# Patient Record
Sex: Female | Born: 1970 | Race: White | Hispanic: No | Smoking: Never smoker
Health system: Southern US, Community
[De-identification: ages and names within clinical notes are randomized; demographics above are authoritative.]

## PROBLEM LIST (undated history)

## (undated) ENCOUNTER — Emergency Department (HOSPITAL_COMMUNITY): Payer: Medicare Other

## (undated) DIAGNOSIS — C50919 Malignant neoplasm of unspecified site of unspecified female breast: Secondary | ICD-10-CM

## (undated) DIAGNOSIS — H9190 Unspecified hearing loss, unspecified ear: Secondary | ICD-10-CM

## (undated) DIAGNOSIS — M2550 Pain in unspecified joint: Secondary | ICD-10-CM

## (undated) DIAGNOSIS — G629 Polyneuropathy, unspecified: Secondary | ICD-10-CM

## (undated) DIAGNOSIS — M7989 Other specified soft tissue disorders: Secondary | ICD-10-CM

## (undated) DIAGNOSIS — M1990 Unspecified osteoarthritis, unspecified site: Secondary | ICD-10-CM

## (undated) DIAGNOSIS — IMO0002 Reserved for concepts with insufficient information to code with codable children: Secondary | ICD-10-CM

## (undated) DIAGNOSIS — G43909 Migraine, unspecified, not intractable, without status migrainosus: Secondary | ICD-10-CM

## (undated) DIAGNOSIS — G709 Myoneural disorder, unspecified: Secondary | ICD-10-CM

## (undated) DIAGNOSIS — Z923 Personal history of irradiation: Secondary | ICD-10-CM

## (undated) DIAGNOSIS — J45909 Unspecified asthma, uncomplicated: Secondary | ICD-10-CM

## (undated) DIAGNOSIS — M329 Systemic lupus erythematosus, unspecified: Secondary | ICD-10-CM

## (undated) DIAGNOSIS — C801 Malignant (primary) neoplasm, unspecified: Secondary | ICD-10-CM

## (undated) DIAGNOSIS — R51 Headache: Secondary | ICD-10-CM

## (undated) DIAGNOSIS — T7840XA Allergy, unspecified, initial encounter: Secondary | ICD-10-CM

## (undated) HISTORY — DX: Myoneural disorder, unspecified: G70.9

## (undated) HISTORY — DX: Unspecified hearing loss, unspecified ear: H91.90

## (undated) HISTORY — DX: Other specified soft tissue disorders: M79.89

## (undated) HISTORY — DX: Malignant (primary) neoplasm, unspecified: C80.1

## (undated) HISTORY — PX: CHOLECYSTECTOMY: SHX55

## (undated) HISTORY — DX: Personal history of irradiation: Z92.3

## (undated) HISTORY — DX: Malignant neoplasm of unspecified site of unspecified female breast: C50.919

## (undated) HISTORY — DX: Systemic lupus erythematosus, unspecified: M32.9

## (undated) HISTORY — DX: Unspecified asthma, uncomplicated: J45.909

## (undated) HISTORY — PX: ABDOMINAL EXPLORATION SURGERY: SHX538

## (undated) HISTORY — DX: Headache: R51

## (undated) HISTORY — DX: Migraine, unspecified, not intractable, without status migrainosus: G43.909

## (undated) HISTORY — DX: Reserved for concepts with insufficient information to code with codable children: IMO0002

## (undated) HISTORY — DX: Pain in unspecified joint: M25.50

## (undated) HISTORY — PX: TONSILLECTOMY: SUR1361

---

## 1993-07-28 HISTORY — PX: APPENDECTOMY: SHX54

## 1995-07-29 HISTORY — PX: SPLENECTOMY, TOTAL: SHX788

## 1995-07-29 HISTORY — PX: TUBAL LIGATION: SHX77

## 1995-07-29 HISTORY — PX: HAND SURGERY: SHX662

## 2011-10-13 ENCOUNTER — Emergency Department: Payer: Self-pay | Admitting: Emergency Medicine

## 2011-10-14 ENCOUNTER — Emergency Department: Payer: Self-pay | Admitting: *Deleted

## 2012-02-11 ENCOUNTER — Other Ambulatory Visit: Payer: Self-pay | Admitting: Obstetrics and Gynecology

## 2012-02-11 DIAGNOSIS — N6310 Unspecified lump in the right breast, unspecified quadrant: Secondary | ICD-10-CM

## 2012-02-11 DIAGNOSIS — N644 Mastodynia: Secondary | ICD-10-CM

## 2012-02-16 ENCOUNTER — Ambulatory Visit
Admission: RE | Admit: 2012-02-16 | Discharge: 2012-02-16 | Disposition: A | Discharge status: Home / Self Care | Payer: No Typology Code available for payment source | Source: Ambulatory Visit | Attending: Obstetrics and Gynecology | Admitting: Obstetrics and Gynecology

## 2012-02-16 DIAGNOSIS — N644 Mastodynia: Secondary | ICD-10-CM

## 2012-02-16 DIAGNOSIS — N6310 Unspecified lump in the right breast, unspecified quadrant: Secondary | ICD-10-CM

## 2012-02-17 ENCOUNTER — Telehealth: Payer: Self-pay | Admitting: *Deleted

## 2012-02-17 NOTE — Telephone Encounter (Signed)
Patient called me late yesterday afternoon and left voicemail. Attempted to call patient back. No one answered phone. Left message for patient to call me back.

## 2012-02-19 ENCOUNTER — Telehealth: Payer: Self-pay | Admitting: *Deleted

## 2012-02-19 ENCOUNTER — Other Ambulatory Visit: Payer: Self-pay | Admitting: Obstetrics and Gynecology

## 2012-02-19 DIAGNOSIS — N6310 Unspecified lump in the right breast, unspecified quadrant: Secondary | ICD-10-CM

## 2012-02-19 NOTE — Telephone Encounter (Signed)
Referred patient to [facility]. Patient has appointment on February 24, 2012 at 1445. Let patient know will let the [facility] know when her appointment is to schedule her biopsy. Patient verbalized understanding.  Notified Fletcher Anon at the [facility] of patients appointment at [facility].

## 2012-02-25 ENCOUNTER — Other Ambulatory Visit: Payer: Self-pay | Admitting: Obstetrics and Gynecology

## 2012-02-25 ENCOUNTER — Ambulatory Visit
Admission: RE | Admit: 2012-02-25 | Discharge: 2012-02-25 | Disposition: A | Discharge status: Home / Self Care | Payer: No Typology Code available for payment source | Source: Ambulatory Visit | Attending: Obstetrics and Gynecology | Admitting: Obstetrics and Gynecology

## 2012-02-25 DIAGNOSIS — N6310 Unspecified lump in the right breast, unspecified quadrant: Secondary | ICD-10-CM

## 2012-02-26 ENCOUNTER — Ambulatory Visit
Admission: RE | Admit: 2012-02-26 | Discharge: 2012-02-26 | Disposition: A | Discharge status: Home / Self Care | Payer: No Typology Code available for payment source | Source: Ambulatory Visit | Attending: Obstetrics and Gynecology | Admitting: Obstetrics and Gynecology

## 2012-02-26 DIAGNOSIS — N6310 Unspecified lump in the right breast, unspecified quadrant: Secondary | ICD-10-CM

## 2012-02-27 ENCOUNTER — Telehealth: Payer: Self-pay | Admitting: *Deleted

## 2012-02-27 ENCOUNTER — Other Ambulatory Visit: Payer: Self-pay | Admitting: *Deleted

## 2012-02-27 ENCOUNTER — Other Ambulatory Visit: Payer: Self-pay | Admitting: Oncology

## 2012-02-27 DIAGNOSIS — C50519 Malignant neoplasm of lower-outer quadrant of unspecified female breast: Secondary | ICD-10-CM | POA: Insufficient documentation

## 2012-02-27 DIAGNOSIS — C50911 Malignant neoplasm of unspecified site of right female breast: Secondary | ICD-10-CM

## 2012-02-27 NOTE — Telephone Encounter (Signed)
Confirmed [facility] for 03/03/12 at 1200.  Instructions and contact information given.

## 2012-03-02 ENCOUNTER — Ambulatory Visit
Admission: RE | Admit: 2012-03-02 | Discharge: 2012-03-02 | Disposition: A | Discharge status: Home / Self Care | Payer: Self-pay | Source: Ambulatory Visit | Attending: Oncology | Admitting: Oncology

## 2012-03-02 DIAGNOSIS — C50911 Malignant neoplasm of unspecified site of right female breast: Secondary | ICD-10-CM

## 2012-03-02 MED ORDER — GADOBENATE DIMEGLUMINE 529 MG/ML IV SOLN
19.0000 mL | Freq: Once | INTRAVENOUS | Status: AC | PRN
  Administered 2012-03-02: 19 mL via INTRAVENOUS

## 2012-03-03 ENCOUNTER — Ambulatory Visit: Payer: Self-pay | Attending: General Surgery | Admitting: Physical Therapy

## 2012-03-03 ENCOUNTER — Ambulatory Visit (HOSPITAL_BASED_OUTPATIENT_CLINIC_OR_DEPARTMENT_OTHER): Payer: Medicare Other | Admitting: General Surgery

## 2012-03-03 ENCOUNTER — Ambulatory Visit: Payer: Self-pay

## 2012-03-03 ENCOUNTER — Other Ambulatory Visit (INDEPENDENT_AMBULATORY_CARE_PROVIDER_SITE_OTHER): Payer: Self-pay | Admitting: General Surgery

## 2012-03-03 ENCOUNTER — Other Ambulatory Visit: Payer: Self-pay | Admitting: Lab

## 2012-03-03 ENCOUNTER — Ambulatory Visit
Admission: RE | Admit: 2012-03-03 | Discharge: 2012-03-03 | Disposition: A | Discharge status: Home / Self Care | Payer: Medicare Other | Source: Ambulatory Visit | Attending: Radiation Oncology | Admitting: Radiation Oncology

## 2012-03-03 ENCOUNTER — Other Ambulatory Visit (HOSPITAL_BASED_OUTPATIENT_CLINIC_OR_DEPARTMENT_OTHER): Payer: No Typology Code available for payment source | Admitting: Lab

## 2012-03-03 ENCOUNTER — Encounter (INDEPENDENT_AMBULATORY_CARE_PROVIDER_SITE_OTHER): Payer: Self-pay | Admitting: General Surgery

## 2012-03-03 ENCOUNTER — Ambulatory Visit (HOSPITAL_BASED_OUTPATIENT_CLINIC_OR_DEPARTMENT_OTHER): Payer: No Typology Code available for payment source | Admitting: Oncology

## 2012-03-03 ENCOUNTER — Encounter: Payer: Self-pay | Admitting: Specialist

## 2012-03-03 ENCOUNTER — Encounter: Payer: Self-pay | Admitting: *Deleted

## 2012-03-03 ENCOUNTER — Other Ambulatory Visit: Payer: Self-pay | Admitting: Oncology

## 2012-03-03 ENCOUNTER — Ambulatory Visit (HOSPITAL_BASED_OUTPATIENT_CLINIC_OR_DEPARTMENT_OTHER): Payer: No Typology Code available for payment source | Admitting: Genetic Counselor

## 2012-03-03 VITALS — BP 119/80 | HR 68 | Temp 98.6°F | Resp 20 | Ht 62.0 in | Wt 203.0 lb

## 2012-03-03 DIAGNOSIS — C50919 Malignant neoplasm of unspecified site of unspecified female breast: Secondary | ICD-10-CM

## 2012-03-03 DIAGNOSIS — N6310 Unspecified lump in the right breast, unspecified quadrant: Secondary | ICD-10-CM

## 2012-03-03 DIAGNOSIS — R293 Abnormal posture: Secondary | ICD-10-CM | POA: Insufficient documentation

## 2012-03-03 DIAGNOSIS — IMO0001 Reserved for inherently not codable concepts without codable children: Secondary | ICD-10-CM | POA: Insufficient documentation

## 2012-03-03 DIAGNOSIS — IMO0002 Reserved for concepts with insufficient information to code with codable children: Secondary | ICD-10-CM

## 2012-03-03 DIAGNOSIS — C773 Secondary and unspecified malignant neoplasm of axilla and upper limb lymph nodes: Secondary | ICD-10-CM

## 2012-03-03 DIAGNOSIS — C50519 Malignant neoplasm of lower-outer quadrant of unspecified female breast: Secondary | ICD-10-CM

## 2012-03-03 DIAGNOSIS — M25619 Stiffness of unspecified shoulder, not elsewhere classified: Secondary | ICD-10-CM | POA: Insufficient documentation

## 2012-03-03 DIAGNOSIS — Z170 Estrogen receptor positive status [ER+]: Secondary | ICD-10-CM

## 2012-03-03 DIAGNOSIS — C50419 Malignant neoplasm of upper-outer quadrant of unspecified female breast: Secondary | ICD-10-CM

## 2012-03-03 LAB — CBC WITH DIFFERENTIAL/PLATELET
Basophils Absolute: 0.1 10*3/uL (ref 0.0–0.1)
Eosinophils Absolute: 0.1 10*3/uL (ref 0.0–0.5)
HGB: 14.3 g/dL (ref 11.6–15.9)
MCV: 98.7 fL (ref 79.5–101.0)
MONO#: 0.6 10*3/uL (ref 0.1–0.9)
NEUT#: 2.7 10*3/uL (ref 1.5–6.5)
RBC: 4.3 10*6/uL (ref 3.70–5.45)
RDW: 13 % (ref 11.2–14.5)
WBC: 7.2 10*3/uL (ref 3.9–10.3)
lymph#: 3.6 10*3/uL — ABNORMAL HIGH (ref 0.9–3.3)

## 2012-03-03 LAB — COMPREHENSIVE METABOLIC PANEL
Albumin: 3.5 g/dL (ref 3.5–5.2)
BUN: 15 mg/dL (ref 6–23)
CO2: 29 mEq/L (ref 19–32)
Calcium: 9.3 mg/dL (ref 8.4–10.5)
Chloride: 102 mEq/L (ref 96–112)
Glucose, Bld: 98 mg/dL (ref 70–99)
Potassium: 3.8 mEq/L (ref 3.5–5.3)
Sodium: 139 mEq/L (ref 135–145)
Total Protein: 7.5 g/dL (ref 6.0–8.3)

## 2012-03-03 NOTE — Progress Notes (Signed)
[facility] Radiation Oncology NEW PATIENT EVALUATION  Name: Holly Parrish MRN: [id number]  Date:   03/03/2012           DOB: 10/27/1970  Status: outpatient   CC: Dr. Emelia Loron   REFERRING PHYSICIAN: Dr. 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 female who is seen today for the courtesy of Dr. Dwain Sarna at the [facility]. for evaluation of her T2 N1 invasive mammary carcinoma the right breast. She first noted a right breast mass or proximally 5 months ago. She was without insurance and delayed her evaluation. [facility] at [facility] on 02/16/2012 at which time she a palpable mass within the right breast at 8:00 measuring approximately 4 cm in size. In addition there is a palpable 3 cm low right axillary lymph node. Ultrasound showed a 2.7 x 2.7 x 2.9 cm mass at 8:00 within the right breast in addition to 2 enlarged lower right axillary lymph nodes. The largest lymph node measured 4 cm in greatest dimensions. The smaller and more superiorly located lymph node measured 3.1 cm. Ultrasound-guided biopsies of the right breast mass and lymph nodes were diagnostic for invasive mammary carcinoma with angiolymphatic spread seen within the right axillary lymph node. Breast MR on 03/02/2012 showed a dominant right breast mass at 8:00 with an adjacent satellite nodule versus an intramammary lymph node in continuity with a dominant mass. There appear to be bulky right axillary lymphadenopathy along with right retropectoral lymphadenopathy. Left breast was unremarkable.  PREVIOUS RADIATION THERAPY: No   PAST MEDICAL HISTORY:  has a past medical history of Lupus; Migraines; Joint pain; Hearing loss; Bleeding nose; Bilateral swelling of feet; Headache; Breast cancer; and Cancer.  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   PAST SURGICAL HISTORY:    Past Surgical History  Procedure Date  . Appendectomy   . Tonsillectomy   . Cholecystectomy   . Tubal ligation      FAMILY HISTORY: family history includes Breast cancer in her cousin and maternal grandmother; Pancreatic cancer in her paternal uncle; Throat cancer in her paternal uncle; and Uterine cancer in her mother. Her mother committed suicide in her 56s. Her father died from congestive heart failure 52.   SOCIAL HISTORY:  reports that she has never smoked. She does not have any smokeless tobacco history on file. She reports that she drinks alcohol. She reports that she does not use illicit drugs. Divorced, 4 children, she works as a Software engineer.   ALLERGIES: Nsaids; Penicillins; Tetanus toxoids; and Hydrogen peroxide   MEDICATIONS:  Current Outpatient Prescriptions  Medication Sig Dispense Refill  . aspirin-acetaminophen-caffeine (EXCEDRIN MIGRAINE) 250-250-65 MG per tablet Take 1 tablet by mouth every 6 (six) hours as needed.      . Emu Oil OIL by Does not apply route.         REVIEW OF SYSTEMS:  Pertinent items are noted in HPI.    PHYSICAL EXAM: Alert and oriented 41 year old white female appearing her stated age. Wt Readings from Last 3 Encounters:  03/03/12 203 lb (92.08 kg)   Temp Readings from Last 3 Encounters:  03/03/12 98.6 F (37 C)    BP Readings from Last 3 Encounters:  03/03/12 119/80   Pulse Readings from Last 3 Encounters:  03/03/12 68   Head and neck examination: Grossly unremarkable.  Nodes:  There are 2 large lymph nodes palpable within the right axilla which are tender to palpation. There is no supraclavicular adenopathy. Breasts: There is a for similar mass palpable at approximately 8:00. Left breast without masses or lesions. Abdomen without hepatomegaly. Extremities: Without edema. Neurologic examination: Grossly nonfocal.    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 She'll be scheduled for a PET CT to complete her staging workup. Dr. Donnie Coffin is seeing her for consideration of neoadjuvant chemotherapy, she may be a candidate for breast preservation. We discussed radiation therapy in general terms including the acute and late toxicity of local regional radiation therapy. Even if she has a complete response to neoadjuvant chemotherapy, and undergoes mastectomy, she still should have post mastectomy radiation therapy based on her extent of local regional disease.   PLAN: As discussed above. I can see her back after her definitive surgery.   I spent 40 minutes minutes face to face with the patient and more than 50% of that time was spent in counseling and/or coordination of care.

## 2012-03-03 NOTE — Progress Notes (Signed)
I met the patient today in the multidisciplinary clinic and reviewed with her the distress screening.  She indicated her distress level is "7".  Holly Parrish is new to the [place] area, having left her ex-husband and 4 children in [place].  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  She indicated today that she has experienced loss on many levels and feels alone.  I strongly encouraged her to attend Breast Cancer Support Group, and at her request, I have made a referral to [facility].  I also told her about counseling and other services available in the Patient and [facility].

## 2012-03-03 NOTE — Progress Notes (Signed)
Holly Parrish [id number] 07/25/1971 40 y.o. 03/03/2012 6:48 PM  CC none  No primary provider on file. No primary provider on file.  REASON FOR CONSULTATION:  Breast cancer Patient was seen in the Multidisciplinary Breast Clinic for discussion of her treatment options. She was seen by Dr. Pierce Crane, [facility]  STAGE:   Cancer of lower-outer quadrant of female breast   Primary site: Breast (Right)   Staging method: AJCC 7th Edition   Clinical: Stage IIB (T2, N1, cM0)   Summary: Stage IIB (T2, N1, cM0)  REFERRING PHYSICIAN: None  HISTORY OF PRESENT ILLNESS:  Holly Parrish is a 41 y.o. female.  Living in [place] referred for evaluation of locally advanced breast cancer. She noted a mass in her right breast about 5 months ago. She did not have any healthcare an insurance and so delayed getting this looked after. She ultimately underwent a mammogram on 02/16/2012. There showed a firm palpable mass right breast 8:00 position measuring 4 cm there was an additional 3 cm lower right axillary lymph node. Ultrasound confirmed the presence of this mass in the breast measuring 2.7 x 2.7 x 2.9 cm. There were 2 large right lower right axillary lymph nodes largest one measuring 4 cm a smaller one measuring 2 x 3.1 cm. She underwent biopsies of both these areas. 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 This is also the ER and PR positive.  Past Medical History: Past Medical History  Diagnosis Date  . Lupus    diagnosed while living in [place]. She had his diagnosis made in 1998 at that time was placed on prednisone and she ultimately came off her medications and start using a more holistic medicines. She does not have any exacerbations  of lupus. She denies any manifestations of this.    . Migraines   . Joint pain   . Hearing loss   . Bleeding nose   . Bilateral swelling of feet   . Headache   .  Breast cancer   . Cancer     Cervical, treated with cryoablation, 2009, no recent Pap smear since  2011     Past Surgical History: Past Surgical History  Procedure Date  . Appendectomy   . Tonsillectomy   . Cholecystectomy   . Tubal ligation    she had a hysterectomy for apparent respectable rupture in 1997 when she was 3 months pregnant she had an unremarkable recovery. She is also hospitalized with what sounds like pancreatitis in 2009 and ultimately had her gallbladder removed.  Family History: Family History  Problem Relation Age of Onset  . Uterine cancer Mother   . Pancreatic cancer Paternal Uncle   . Throat cancer Paternal Uncle   . Breast cancer Maternal Grandmother   26s    . Breast cancer                                                                     3 Cousins ages 3, 11 and 59-52     Social History History  Substance Use Topics  . Smoking status: Never Smoker   . Smokeless tobacco: Not on file  . 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 She is sensitive to [facility] the past year where she has a nephew in [place] and is working as a Fish farm manager here. All her family is in [place]. She has 4 children all of whom in [place] as well. She's been married twice first time performance a second time for 18 years and recently has been divorced.  Allergies: Allergies  Allergen Reactions  . Nsaids Anaphylaxis  . Penicillins Anaphylaxis  . Tetanus Toxoids Anaphylaxis  . Hydrogen Peroxide Other (See Comments)    "It burns until I bleed."    Current Medications: Current Outpatient Prescriptions  Medication Sig Dispense Refill  . aspirin-acetaminophen-caffeine (EXCEDRIN MIGRAINE) 250-250-65 MG per tablet Take 1 tablet by mouth every 6 (six) hours as needed.      . Emu Oil OIL by Does not apply route.        OB/GYN History: G5 P5 one daughter died at age 15, menarche  at age 11 age of first live birth is 89 no recent history of birth control pill use has normal menses.  Fertility Discussion: No Prior History of Cancer: In situ carcinoma the cervix  Health Maintenance:  Colonoscopy yes Bone Density no Last PAP smear last 2011  ECOG PERFORMANCE STATUS: 0 - Asymptomatic  Genetic Counseling/testing: To be scheduled  REVIEW OF SYSTEMS:  She has migratory aches and pains. She has occasional migraines. She denies blurry vision or double vision. Appetite good weight is stable she denies night sweats or fevers. Denies any chest pain nausea vomiting numbness tingling in hands and feet.  PHYSICAL EXAMINATION: Blood pressure 119/80, pulse 68, temperature 98.6 F (37 C), resp. rate 20, height 5\' 2"  (1.575 m), weight 203 lb (92.08 kg).  HEENT:  Sclerae anicteric, conjunctivae pink.  Oropharynx clear.  No mucositis or candidiasis.  Nodes:  No cervical, supraclavicular, or axillary lymphadenopathy palpated.  Breast Exam:  Right breast has a 3-4 cm mass appreciated in the lower portion of the breast. This does not appear to be fixed underlying fascia. There is palpable tender lymph nodes in the right axilla measuring at least 3 cm.   Left breast is benign.  No masses, discharge, skin change, or nipple inversion..  Lungs:  Clear to auscultation bilaterally.  No crackles, rhonchi, or wheezes.  Heart:  Regular rate and rhythm.  Abdomen:  Soft, nontender.  Positive bowel sounds.  No organomegaly or masses palpated.  Musculoskeletal:  No focal spinal tenderness to palpation.  Extremities:  Benign.  No peripheral edema or cyanosis.  Skin:  Benign.  Neuro:  Nonfocal.      STUDIES/RESULTS: US Breast Right  02/24/2012  **ADDENDUM** CREATED: 02/16/2012 15:32:07  CORRECTED REPORT  For technical reasons, the initial report is incorrect and this addended report is the correct report.  *RADIOLOGY REPORT*  Clinical Data:  The patient notes a palpable mass within the lateral right  breast and also within the right axilla.  There is a family history of breast cancer in her mother at age 38 and and three paternal cousins and a paternal grandmother.  There is no family history of ovarian cancer.  The patient has a personal history of cervical cancer.  DIGITAL DIAGNOSTIC BILATERAL BASELINE MAMMOGRAM WITH CAD AND RIGHT BREAST ULTRASOUND:  Comparison:  None.  Findings:  There is a scattered fibroglandular pattern.  There is an irregularly marginated mass with spiculation located within the lateral right breast at  approximately 9 o'clock position which by mammography measures 4 cm in greatest dimension.  In addition, there are at least two enlarged right axillary lymph nodes present. There is no abnormality within the left breast. Mammographic images were processed with CAD.  On physical exam, there is a firm palpable mass located within the right breast at the 8 o'clock position 4-5 cm from the nipple.  By physical examination this measures approximately 4 cm in size.  In addition, there is a palpable 3 cm low right axillary lymph node present. There is no skin thickening.  Ultrasound is performed, showing an irregular hypoechoic mass located within the right breast at the 8 o'clock position 4 cm from nipple corresponding to the palpable finding with posterior acoustical shadowing.  This measures 2.7 x 2.7 x 2.9 cm in size. There are two enlarged lower right axillary lymph nodes present . The largest lymph node measures 4 cm in greatest dimension.  The smaller more superiorly located lymph node measures 3.1 cm in size. These are worrisome for metastatic lymph nodes.  IMPRESSION: Large (4 cm) palpable mass located within the right breast at the 8 o'clock position worrisome for primary carcinoma of the breast. This is associated with two enlarged abnormal appearing right lower axillary lymph nodes worrisome for metastatic carcinoma.  I have discussed ultrasound-guided core biopsy of the mass and  abnormal axillary lymph nodes.  This will be scheduled following enrollment of the patient in the BCCCP program (she has no health insurance).  RECOMMENDATION: Ultrasound guided core biopsy as discussed above.  BI-RADS CATEGORY 5:  Highly suggestive of malignancy - appropriate action should be taken.   **END ADDENDUM** SIGNED BY: Rolla Plate, M.D.   02/24/2012  *RADIOLOGY REPORT*  Clinical Data:  Recall from screening mammography.  DIGITAL DIAGNOSTIC LEFT BREAST MAMMOGRAM WITH CAD  Comparison:  Prior studies dating back to 09/15/2005.  Findings:  There is a heterogeneously dense parenchymal pattern. There is no persistent mass or distortion.  The appearance noted on the screening study is consistent with a summation shadow.Mammographic images were processed with CAD.  IMPRESSION: No persistent abnormality on additional evaluation of the left breast.  The appearance noted on the screening study is consistent with a summation shadow.  RECOMMENDATION: Screening mammography in 1 year.  BI-RADS CATEGORY 1:  Negative.  Original Report Authenticated By: Rolla Plate, M.D.   Mr Breast Bilateral W Wo Contrast  03/02/2012  *RADIOLOGY REPORT*  Clinical Data: Newly-diagnosed right breast eight o'clock location invasive mammary carcinoma with right axillary metastatic disease. History of breast cancer in the patient's mother at age 31.  BUN and creatinine were obtained on site at [facility] at [address] Results:  BUN 14 mg/dL,  Creatinine 0.9 mg/dL.  BILATERAL BREAST MRI WITH AND WITHOUT CONTRAST  Technique: Multiplanar, multisequence MR images of both breasts were obtained prior to and following the intravenous administration of 19ml of Multihance.  Three dimensional images were evaluated at the independent DynaCad workstation.  Comparison:  Prior mammograms and ultrasound  Findings: Right retro pectoral lymphadenopathy, largest 1.2 cm, and right axillary lymphadenopathy, largest 2.2 cm in short  axis diameter, is noted.  There is diffuse right breast skin thickening, edema, and enhancement.  Dominant right breast eight o'clock location irregular enhancing mass is noted with enhancement extending to the surface of the right pectoralis major muscle and evidence of anterior traction upon the muscle.  No underlying chest wall structure enhancement.  The dominant mass demonstrates internal clip artifact  at its anterior aspect and measures 3.7 x 3.7 x 4.2 cm.  Predominately plateau type enhancement kinetics are noted.  Immediately adjacent to this dominant right breast eight o'clock location mass is an irregular satellite nodule or possible intramammary lymph node measuring 1.2 cm.  No contralateral left- sided lymphadenopathy or internal mammary artery chain lymphadenopathy on either side.  No other area of abnormal enhancement is identified in either breast.  No left-sided abnormal T2-weighted hyperintensity.  IMPRESSION: Dominant right breast mass 8 o'clock location with adjacent satellite nodule versus intramammary node in contiguity with the dominant measured mass, corresponding to biopsy-proven breast cancer.  Enhancement extends to the surface of the right pectoralis major muscle with anterior tenting of the muscle but no MRI evidence for involvement of underlying chest wall structures.  Bulky right axillary lymphadenopathy compatible with biopsy-proven metastatic disease.  Right retro pectoral lymphadenopathy.  No MRI evidence for malignancy in the left breast.  RECOMMENDATION: Treatment plan  BI-RADS CATEGORY 6:  Known biopsy-proven malignancy - appropriate action should be taken.  THREE-DIMENSIONAL MR IMAGE RENDERING ON INDEPENDENT WORKSTATION:  Three-dimensional MR images were rendered by post-processing of the original MR data on an independent workstation.  The three- dimensional MR images were interpreted, and findings were reported in the accompanying complete MRI report for this study.  Original  Report Authenticated By: Harrel Lemon, M.D.   [place] Core Biopsy  02/25/2012  *RADIOLOGY REPORT*  Clinical Data:  Palpable right breast mass with right axillary adenopathy.  ULTRASOUND GUIDED CORE BIOPSY OF RIGHT AXILLARY LYMPH NODE  Comparison: Previous exams  I met with the patient and we discussed the procedure of ultrasound- guided biopsy, including benefits and alternatives.  We discussed the high likelihood of a successful procedure. We discussed the risks of the procedure, including infection, bleeding, tissue injury, clip migration, and inadequate sampling.  Informed written consent was given.  Using sterile technique 2% lidocaine, ultrasound guidance and a 14 gauge automated biopsy device, biopsy was performed of an enlarged right axillary lymph node using aninferior approach. No clip was placed within the lymph node.  IMPRESSION: Ultrasound guided biopsy of enlarged right axillary lymph node as discussed above.  No apparent complications.  Original Report Authenticated By: Rolla Plate, M.D.   [place] Core Biopsy  02/25/2012  *RADIOLOGY REPORT*  Clinical Data:  Right breast mass with right axillary adenopathy.  ULTRASOUND GUIDED CORE BIOPSY OF THE right BREAST  Comparison: Previous exams  I met with the patient and we discussed the procedure of ultrasound- guided biopsy, including benefits and alternatives.  We discussed the high likelihood of a successful procedure. We discussed the risks of the procedure, including infection, bleeding, tissue injury, clip migration, and inadequate sampling.  Informed written consent was given.  Using sterile technique 2% lidocaine, ultrasound guidance and a 14 gauge automated biopsy device, biopsy was performed of the mass located within the right breast at the 8 o'clock position using a inferior lateral approach.  At the conclusion of the procedure a ribbon shaped tissue marker clip was deployed into the biopsy cavity.  Follow up 2 view mammogram was performed and  dictated separately.  IMPRESSION: Ultrasound guided biopsy of the right breast mass located at the 8 o'clock position as discussed above.  No apparent complications.  Original Report Authenticated By: Rolla Plate, M.D.   Mm Digital Diagnostic Bilat  02/16/2012  **ADDENDUM** CREATED: 02/16/2012 15:32:07  CORRECTED REPORT  For technical reasons, the initial report is incorrect and this addended report is the correct  report.  *RADIOLOGY REPORT*  Clinical Data:  The patient notes a palpable mass within the lateral right breast and also within the right axilla.  There is a family history of breast cancer in her mother at age 26 and and three paternal cousins and a paternal grandmother.  There is no family history of ovarian cancer.  The patient has a personal history of cervical cancer.  DIGITAL DIAGNOSTIC BILATERAL BASELINE MAMMOGRAM WITH CAD AND RIGHT BREAST ULTRASOUND:  Comparison:  None.  Findings:  There is a scattered fibroglandular pattern.  There is an irregularly marginated mass with spiculation located within the lateral right breast at approximately 9 o'clock position which by mammography measures 4 cm in greatest dimension.  In addition, there are at least two enlarged right axillary lymph nodes present. There is no abnormality within the left breast.  Mammographic images were processed with CAD.  On physical exam, there is a firm palpable mass located within the right breast at the 8 o'clock position 4-5 cm from the nipple.  By physical examination this measures approximately 4 cm in size.  In addition, there is a palpable 3 cm low right axillary lymph node present. There is no skin thickening.  Ultrasound is performed, showing an irregular hypoechoic mass located within the right breast at the 8 o'clock position 4 cm from nipple corresponding to the palpable finding with posterior acoustical shadowing.  This measures 2.7 x 2.7 x 2.9 cm in size. There are two enlarged lower right axillary lymph nodes  present . The largest lymph node measures 4 cm in greatest dimension.  The smaller more superiorly located lymph node measures 3.1 cm in size. These are worrisome for metastatic lymph nodes.  IMPRESSION: Large (4 cm) palpable mass located within the right breast at the 8 o'clock position worrisome for primary carcinoma of the breast. This is associated with two enlarged abnormal appearing right lower axillary lymph nodes worrisome for metastatic carcinoma.  I have discussed ultrasound-guided core biopsy of the mass and abnormal axillary lymph nodes.  This will be scheduled following enrollment of the patient in the BCCCP program (she has no health insurance).  RECOMMENDATION: Ultrasound guided core biopsy as discussed above.  BI-RADS CATEGORY 5:  Highly suggestive of malignancy - appropriate action should be taken.   **END ADDENDUM** SIGNED BY: Rolla Plate, M.D.   02/16/2012  *RADIOLOGY REPORT*  Clinical Data:  Recall from screening mammography.  DIGITAL DIAGNOSTIC LEFT BREAST MAMMOGRAM WITH CAD  Comparison:  Prior studies dating back to 09/15/2005.  Findings:  There is a heterogeneously dense parenchymal pattern. There is no persistent mass or distortion.  The appearance noted on the screening study is consistent with a summation shadow.Mammographic images were processed with CAD.  IMPRESSION: No persistent abnormality on additional evaluation of the left breast.  The appearance noted on the screening study is consistent with a summation shadow.  RECOMMENDATION: Screening mammography in 1 year.  BI-RADS CATEGORY 1:  Negative.  Original Report Authenticated By: Rolla Plate, M.D.   Mm Digital Diagnostic Unilat R  02/25/2012  *RADIOLOGY REPORT*  Clinical Data:  Post right breast ultrasound guided core biopsy.  DIGITAL DIAGNOSTIC RIGHT BREAST MAMMOGRAM  Comparison: Ultrasound  Findings:  Films are performed following the palpable mass located within the right breast at the 8 o'clock position. guided  biopsy of the ribbon shaped clip is associated the anterior aspect of the mass.  IMPRESSION: The ribbon shaped clip is associated with the anterior aspect of the mass located within  the right breast at the 8 o'clock position.  Original Report Authenticated By: Rolla Plate, M.D.   Mm Radiologist Eval And Mgmt  02/26/2012  *RADIOLOGY REPORT*  ESTABLISHED PATIENT OFFICE VISIT - LEVEL II [phone])  Chief Complaint:  Status post ultrasound-guided core needle biopsy of a right breast mass and right axillary lymph node.  History:  Status post ultrasound-guided core needle biopsy of a right breast mass and right axillary lymph node.  The patient reports no pain at either biopsy site today.  Exam:  There is a small area of bruising at the site of biopsy in the lateral right breast with no palpable hematoma.  There is no bruising or palpable hematoma at the location of the axillary lymph node biopsy.  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  Assessment and Plan:  The pathological diagnoses are concordant with imaging findings.  The diagnoses were discussed with the patient and her questions were answered.  She will be contacted by the Multidisciplinary Clinic for an appointment.  She is going to enroll in [place] before obtaining a breast MR.  Original Report Authenticated By: Darrol Angel, M.D.     LABS:    Chemistry      Component Value Date/Time   NA 139 03/03/2012 1205   K 3.8 03/03/2012 1205   CL 102 03/03/2012 1205   CO2 29 03/03/2012 1205   BUN 15 03/03/2012 1205   CREATININE 0.87 03/03/2012 1205      Component Value Date/Time   CALCIUM 9.3 03/03/2012 1205   ALKPHOS 86 03/03/2012 1205   AST 17 03/03/2012 1205   ALT 11 03/03/2012 1205   BILITOT 0.2* 03/03/2012 1205      Lab Results  Component Value Date   WBC 7.2 03/03/2012   HGB 14.3 03/03/2012   HCT 42.4 03/03/2012   MCV 98.7  03/03/2012   PLT 431* 03/03/2012       PATHOLOGY: As above  ASSESSMENT    Locally advanced HER-2 positive breast cancer  Clinical Trial Eligibility: No Multidisciplinary conference discussion yes    PLAN:    This patient has locally advanced HER-2 positive breast cancer. I discussed the natural history of HER-2 positive disease and the use of Herceptin based therapy. I think she is an excellent candidate for neoadjuvant treatment. Plan is to go staging scans, port placement, chemotherapy teachuing, genetic testing, as well as 2-D echo. We will see her then see her again in followup to initiate treatment. She has limited social support in the area as well. This might be an issue going forward.       Discussion: Patient is being treated per NCCN breast cancer care guidelines appropriate for stage.IIb   Thank you so much for allowing me to participate in the care of [facility]. I will continue to follow up the patient with you and assist in her care.  All questions were answered. The patient knows to call the clinic with any problems, questions or concerns. We can certainly see the patient much sooner if necessary.  I spent 30 minutes counseling the patient face to face. The total time spent in the appointment was 60 minutes.     Pierce Crane M.D. FRCP C. 03/03/2012, 6:48 PM

## 2012-03-03 NOTE — Progress Notes (Addendum)
Patient ID: Holly Parrish, female   DOB: 1971-07-27, 41 y.o.   MRN: [id number]  Chief Complaint  Patient presents with  . Other    breast cancer    HPI Holly Parrish is a 41 y.o. female.  Referred by Dr. Gordan Payment HPI 20 yof with right breast mass she noted associated with pain over last several weeks.  She states she felt right axillary mass and lately she has noticed pain in that region.  She underwent mammogram with a large 4 cm mass in the right breast at 8 oclock.  Ultrasound showed a 2.7x2.7x2.9 cm mass with enlarged axillary nodes present.  She has also undergone MRI a 3.7x3.7x4.2 cm breast mass on right with satellite nodule measuring 1.2 cm.  There is extensive right axillary and retropectoral adenopathy.  There are no left sided breast or nodal abnormalities. 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  Her nodal biopsy is positive for metastatic cancer. She has no history of any breast complaints in the past.  She presents to [facility] today for options.  Past Medical History  Diagnosis Date  . Lupus   . Migraines   . Joint pain   . Hearing loss   . Bleeding nose   . Bilateral swelling of feet   . Headache   . Breast cancer   . Cancer     Cervical, treated with cryoablation    Past Surgical History  Procedure Date  . Appendectomy   . Tonsillectomy   . Cholecystectomy   . Tubal ligation     Family History  Problem Relation Age of Onset  . Uterine cancer Mother   . Pancreatic cancer Paternal Uncle   . Throat cancer Paternal Uncle   . Breast cancer Maternal Grandmother   . Breast cancer Cousin     Social History History  Substance Use Topics  . Smoking status: Never Smoker   . Smokeless tobacco: Not on file  . Alcohol Use: Yes    Allergies  Allergen Reactions  . Nsaids Anaphylaxis  . Penicillins Anaphylaxis  . Tetanus Toxoids Anaphylaxis  . Hydrogen Peroxide Other (See Comments)    "It burns until I bleed."    Current Outpatient  Prescriptions  Medication Sig Dispense Refill  . aspirin-acetaminophen-caffeine (EXCEDRIN MIGRAINE) 250-250-65 MG per tablet Take 1 tablet by mouth every 6 (six) hours as needed.      . Emu Oil OIL by Does not apply route.        Review of Systems Review of Systems  Constitutional: Positive for fatigue. Negative for fever, chills and unexpected weight change.  HENT: Negative for hearing loss, congestion, sore throat, trouble swallowing and voice change.   Eyes: Negative for visual disturbance.  Respiratory: Negative for cough and wheezing.   Cardiovascular: Negative for chest pain, palpitations and leg swelling.  Gastrointestinal: Negative for nausea, vomiting, abdominal pain, diarrhea, constipation, blood in stool, abdominal distention and anal bleeding.  Genitourinary: Negative for hematuria, vaginal bleeding and difficulty urinating.  Musculoskeletal: Positive for arthralgias.  Skin: Negative for rash and wound.  Neurological: Positive for numbness and headaches. Negative for seizures and syncope.  Hematological: Negative for adenopathy. Does not bruise/bleed easily.  Psychiatric/Behavioral: Negative for confusion.    There were no vitals taken for this visit.  Physical Exam Physical Exam  Vitals reviewed. Constitutional: She appears well-developed and well-nourished.  Eyes: No scleral icterus.  Neck: Neck supple.  Cardiovascular: Normal rate, regular rhythm and normal heart sounds.  Pulmonary/Chest: Effort normal and breath sounds normal. She has no wheezes. She has no rales. Right breast exhibits mass. Right breast exhibits no inverted nipple, no nipple discharge, no skin change and no tenderness. Left breast exhibits no inverted nipple, no mass, no nipple discharge, no skin change and no tenderness. Breasts are symmetrical.    Abdominal: Soft.  Lymphadenopathy:    She has no cervical adenopathy.    She has axillary adenopathy.       Right axillary: Lateral adenopathy  present. No pectoral adenopathy present.       Left axillary: No pectoral and no lateral adenopathy present.      Right: No supraclavicular adenopathy present.       Left: No supraclavicular adenopathy present.    Data Reviewed BILATERAL BREAST MRI WITH AND WITHOUT CONTRAST  Technique: Multiplanar, multisequence MR images of both breasts  were obtained prior to and following the intravenous administration  of 19ml of Multihance. Three dimensional images were evaluated at  the independent DynaCad workstation.  Comparison: Prior mammograms and ultrasound  Findings: Right retro pectoral lymphadenopathy, largest 1.2 cm, and  right axillary lymphadenopathy, largest 2.2 cm in short axis  diameter, is noted. There is diffuse right breast skin thickening,  edema, and enhancement. Dominant right breast eight o'clock  location irregular enhancing mass is noted with enhancement  extending to the surface of the right pectoralis major muscle and  evidence of anterior traction upon the muscle. No underlying chest  wall structure enhancement. The dominant mass demonstrates  internal clip artifact at its anterior aspect and measures 3.7 x  3.7 x 4.2 cm. Predominately plateau type enhancement kinetics are  noted. Immediately adjacent to this dominant right breast eight  o'clock location mass is an irregular satellite nodule or possible  intramammary lymph node measuring 1.2 cm. No contralateral left-  sided lymphadenopathy or internal mammary artery chain  lymphadenopathy on either side.  No other area of abnormal enhancement is identified in either  breast. No left-sided abnormal T2-weighted hyperintensity.  IMPRESSION:  Dominant right breast mass 8 o'clock location with adjacent  satellite nodule versus intramammary node in contiguity with the  dominant measured mass, corresponding to biopsy-proven breast  cancer. Enhancement extends to the surface of the right pectoralis  major muscle with  anterior tenting of the muscle but no MRI  evidence for involvement of underlying chest wall structures.  Bulky right axillary lymphadenopathy compatible with biopsy-proven  metastatic disease.  Right retro pectoral lymphadenopathy.  No MRI evidence for malignancy in the left breast.  RECOMMENDATION:  Treatment plan  BI-RADS CATEGORY 6: Known biopsy-proven malignancy - appropriate  action should be taken.   Assessment    Locally advanced right breast cancer    Plan    We discussed primary systemic therapy to try to downstage this.  I discussed port placement prior to this with risks including but not limited to bleeding, infection, pneumothorax.  I also recommended clip placement in the satellite nodule so that if this ends up being amenable to a lumpectomy we can make sure they are removed. I told her there is 50-60% chance she could undergo lumpectomy after primary therapy.  We will make that decision when chemo complete.   We also discussed the staging and pathophysiology of breast cancer. We discussed all of the different options for treatment for breast cancer including surgery, chemotherapy, radiation therapy, Herceptin, and antiestrogen therapy.   We discussed an axillary node dissection at time of surgery due to positive nodes  now.   We discussed the options for treatment of the breast cancer which included lumpectomy versus a mastectomy. We discussed the performance of the lumpectomy with a wire placement. We discussed a 10-20% chance of a positive margin requiring reexcision in the operating room. We also discussed that she may need radiation therapy or antiestrogen therapy or both if she undergoes lumpectomy. We discussed the mastectomy and the postoperative care for that as well. We discussed that there is no difference in her survival whether she undergoes lumpectomy with radiation therapy or antiestrogen therapy versus a mastectomy. There is a slight difference in the local  recurrence rate being 3-5% with lumpectomy and about 1% with a mastectomy. We will try to shrink this so might be able to do lumpectomy. She is also going to undergo genetic testing as well as staging scans.        Holly Parrish 03/03/2012, 4:37 PM

## 2012-03-04 ENCOUNTER — Other Ambulatory Visit: Payer: Self-pay | Admitting: *Deleted

## 2012-03-04 ENCOUNTER — Other Ambulatory Visit: Payer: Self-pay | Admitting: Oncology

## 2012-03-04 ENCOUNTER — Encounter: Payer: Self-pay | Admitting: *Deleted

## 2012-03-04 ENCOUNTER — Encounter (HOSPITAL_BASED_OUTPATIENT_CLINIC_OR_DEPARTMENT_OTHER): Payer: Self-pay | Admitting: *Deleted

## 2012-03-04 DIAGNOSIS — N6310 Unspecified lump in the right breast, unspecified quadrant: Secondary | ICD-10-CM

## 2012-03-04 DIAGNOSIS — C50519 Malignant neoplasm of lower-outer quadrant of unspecified female breast: Secondary | ICD-10-CM

## 2012-03-04 NOTE — Progress Notes (Signed)
No labs needed

## 2012-03-05 ENCOUNTER — Telehealth: Payer: Self-pay | Admitting: Oncology

## 2012-03-05 ENCOUNTER — Encounter: Payer: Self-pay | Admitting: Genetic Counselor

## 2012-03-05 ENCOUNTER — Ambulatory Visit
Admission: RE | Admit: 2012-03-05 | Discharge: 2012-03-05 | Disposition: A | Discharge status: Home / Self Care | Payer: No Typology Code available for payment source | Source: Ambulatory Visit | Attending: Oncology | Admitting: Oncology

## 2012-03-05 ENCOUNTER — Other Ambulatory Visit: Payer: Self-pay

## 2012-03-05 ENCOUNTER — Other Ambulatory Visit: Payer: Self-pay | Admitting: Oncology

## 2012-03-05 ENCOUNTER — Ambulatory Visit
Admission: RE | Admit: 2012-03-05 | Discharge: 2012-03-05 | Disposition: A | Discharge status: Home / Self Care | Payer: Self-pay | Source: Ambulatory Visit | Attending: Oncology | Admitting: Oncology

## 2012-03-05 DIAGNOSIS — N6310 Unspecified lump in the right breast, unspecified quadrant: Secondary | ICD-10-CM

## 2012-03-05 NOTE — Progress Notes (Signed)
Dr.  Donnie Coffin requested a consultation for genetic counseling and risk assessment for [facility], a 41 y.o. female, for discussion of her personal history of breast cancer. She presents to clinic today to discuss the possibility of a genetic predisposition to cancer, and to further clarify her risks, as well as her family members' risks for cancer.   HISTORY OF PRESENT ILLNESS: In August 2013, at the age of 62, Holly Parrish was diagnosed with triple positive breast cancer.    Past Medical History  Diagnosis Date  . Lupus   . Migraines   . Joint pain   . Hearing loss   . Bleeding nose   . Bilateral swelling of feet   . Headache   . Breast cancer 40  . Cancer 37    Cervical, treated with cryoablation    Past Surgical History  Procedure Date  . Appendectomy   . Tonsillectomy   . Cholecystectomy   . Tubal ligation   . 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 Smoking status: Never Smoker   . Smokeless tobacco: Not on file  . Alcohol Use: Yes     occasional drinker    REPRODUCTIVE HISTORY AND PERSONAL RISK ASSESSMENT FACTORS: Menarche was at age 55.   Premenopausal Uterus Intact: Yes Ovaries Intact: Yes [phone] , first live birth at age 52  She has not previously undergone treatment for infertility.   OCP use for 8 years   She has not used HRT in the past.    FAMILY HISTORY:  We obtained a detailed, 4-generation family history.  Significant diagnoses are listed below: Family History  Problem Relation Age of Onset  . Uterine cancer Mother 36  . Pancreatic cancer Paternal Uncle     diagnosed in his 77s; smoker; half uncle related through grandmother  . Liver cancer Maternal Grandmother     not a drinker  . Breast cancer Cousin     3 paternal cousins with breast cancer, onset 71, 14 and one bilateral at  2 and 62  . Heart attack Father 44  . Breast cancer Paternal Grandmother 46  . Heart attack Paternal Grandfather   . Throat  cancer Paternal Aunt     smoker; half uncle related through grandmother  . Cystic fibrosis Cousin   The patient was diagnosed with breast cancer at age 23.  She has had 12 pregnancies.  Of these, three had anencepahly, a daughter died at the age of two from complications of cerebral palsy and juvenile diabetes, and she had 4 miscarriages.  Her four living children are healthy, although one son has an arachnoid tumor that is being watched.  She had three brothers and a sister.  One brother drown, one brother was electrocuted and the third brother died from a horseback riding accident.  Her sister died of SIDS.  The patient's mother was diagnosed with uterine cancer and died at age 68 from suicide.  Her maternal grandmother had liver cancer and died at 22.  The patient's father died of an MI at 6.  He had two full sisters and three maternal half brothers.  Both sisters were healthy, but had daughters with breast cancer.  One cousin was diagnosed at ages 34 and 72, another cousin at age 32 and the third cousin at 37.  One uncle had pancreatic cancer diagnosed in his 53s and another uncle diagnosed with throat cancer.  Both were smokers.  The patient's  paternal grandmother was diagnosed at age 60 with breast cancer.  There is no other reported cancer history on either side of the family.  Patient's maternal ancestors are of [place] and [place] descent, and paternal ancestors are of [place] and [place] descent. There is no reported Ashkenazi Jewish ancestry. There is no known consanguinity.  GENETIC COUNSELING RISK ASSESSMENT, DISCUSSION, AND SUGGESTED FOLLOW UP: We reviewed the natural history and genetic etiology of sporadic, familial and hereditary cancer syndromes.  About 5-10% of breast cancer is hereditary.  Of this, about 85% is the result of a BRCA1 or BRCA2 mutation.  We reviewed the red flags of hereditary cancer syndromes and the dominant inheritance patterns.   The patient's personal and  family history is suggestive of the following possible diagnosis: hereditary cancer syndrome  We discussed that identification of a hereditary cancer syndrome may help her care providers tailor the patients medical management. If a mutation indicating a hereditary cancer syndrome is detected in this case, the [employer] recommendations would include increased cancer surviellance and possible prophylactic surgery. If a mutation is detected, the patient will be referred back to the referring provider and to any additional appropriate care providers to discuss the relevant options.   If a mutation is not found in the patient, this will decrease the likelihood of a hereditary cancer syndrome as the explanation for her breast cancer. Cancer surveillance options would be discussed for the patient according to the appropriate standard National Comprehensive Cancer Network and American Cancer Society guidelines, with consideration of their personal and family history risk factors. In this case, the patient will be referred back to their care providers for discussions of management.   In order to estimate her chance of having a BRCA1 or BRCA2 mutation, we used statistical models (Penn II) and laboratory data that take into account her personal medical history, family history and ancestry.  Because each model is different, there can be a lot of variability in the risks they give.  Therefore, these numbers must be considered a rough range and not a precise risk of having a BRCA1 or BRCA2 mutation.  These models estimate that she has approximately a 24% chance of having a mutation. Based on this assessment of her family and personal history, genetic testing is recommended.  After considering the risks, benefits, and limitations, the patient provided informed consent for  the following  testing: [facility] through [employer] [facility].   Per the patient's request, we will contact her by  telephone to discuss these results. A follow up genetic counseling visit will be scheduled if indicated.  The patient was seen for a total of 30 minutes, greater than 50% of which was spent face-to-face counseling.  This plan is being carried out per Dr. Renelda Loma recommendations.  This note will also be sent to the referring provider via the electronic medical record. The patient will be supplied with a summary of this genetic counseling discussion as well as educational information on the discussed hereditary cancer syndromes following the conclusion of their visit.   Patient was discussed with Dr. Drue Second.  _______________________________________________________________________ For Office Staff:  Number of people involved in session: 2 Was an Intern/ student involved with case: not applicable

## 2012-03-05 NOTE — Telephone Encounter (Signed)
lmonvm adviising the pt of her chemo educ class appt,echo and the pet scan appt appt with instructions

## 2012-03-08 ENCOUNTER — Telehealth: Payer: Self-pay | Admitting: *Deleted

## 2012-03-08 NOTE — Telephone Encounter (Signed)
Spoke to pt concerning [facility] from 8/7.  Pt denies questions or concerns regarding dx or treatment care plan.  Pt did request that all appts to be made after 1:00pm.  Moved all appts after 1:000.  Left vm for pt to return call to give updated dates and time of future appts.  Encourage pt to call with further needs.  Contact information given.

## 2012-03-08 NOTE — Telephone Encounter (Signed)
Per orders from [facility] changed the patient's appointment to after 1:00pm

## 2012-03-09 ENCOUNTER — Ambulatory Visit (HOSPITAL_COMMUNITY)
Admission: RE | Admit: 2012-03-09 | Discharge: 2012-03-09 | Disposition: A | Discharge status: Home / Self Care | Payer: Medicare Other | Source: Ambulatory Visit | Attending: Oncology | Admitting: Oncology

## 2012-03-09 ENCOUNTER — Encounter: Payer: Self-pay | Admitting: *Deleted

## 2012-03-09 ENCOUNTER — Ambulatory Visit (HOSPITAL_COMMUNITY): Payer: No Typology Code available for payment source

## 2012-03-09 ENCOUNTER — Other Ambulatory Visit: Payer: Self-pay

## 2012-03-09 DIAGNOSIS — C50519 Malignant neoplasm of lower-outer quadrant of unspecified female breast: Secondary | ICD-10-CM | POA: Insufficient documentation

## 2012-03-09 NOTE — Progress Notes (Signed)
*  PRELIMINARY RESULTS* Echocardiogram 2D Echocardiogram has been performed.  Holly Parrish 03/09/2012, 3:18 PM 

## 2012-03-09 NOTE — Progress Notes (Signed)
Mailed after appt letter to pt. 

## 2012-03-10 ENCOUNTER — Telehealth: Payer: Self-pay | Admitting: *Deleted

## 2012-03-10 ENCOUNTER — Encounter (HOSPITAL_BASED_OUTPATIENT_CLINIC_OR_DEPARTMENT_OTHER): Payer: Self-pay | Admitting: *Deleted

## 2012-03-10 NOTE — Telephone Encounter (Signed)
Left message for pt. To call office to reschedule missed chemo class from Tuesday August 13th.

## 2012-03-10 NOTE — Telephone Encounter (Signed)
No additional notes

## 2012-03-11 ENCOUNTER — Encounter (HOSPITAL_BASED_OUTPATIENT_CLINIC_OR_DEPARTMENT_OTHER): Payer: Self-pay | Admitting: Anesthesiology

## 2012-03-11 ENCOUNTER — Telehealth: Payer: Self-pay | Admitting: *Deleted

## 2012-03-11 ENCOUNTER — Ambulatory Visit (HOSPITAL_COMMUNITY): Payer: Medicare Other

## 2012-03-11 ENCOUNTER — Ambulatory Visit (HOSPITAL_BASED_OUTPATIENT_CLINIC_OR_DEPARTMENT_OTHER): Payer: Medicare Other | Admitting: Anesthesiology

## 2012-03-11 ENCOUNTER — Ambulatory Visit (HOSPITAL_BASED_OUTPATIENT_CLINIC_OR_DEPARTMENT_OTHER)
Admission: RE | Admit: 2012-03-11 | Discharge: 2012-03-11 | Disposition: A | Discharge status: Home / Self Care | Payer: Medicare Other | Source: Ambulatory Visit | Attending: General Surgery | Admitting: General Surgery

## 2012-03-11 ENCOUNTER — Encounter (HOSPITAL_BASED_OUTPATIENT_CLINIC_OR_DEPARTMENT_OTHER)
Admission: RE | Disposition: A | Discharge status: Home / Self Care | Payer: Self-pay | Source: Ambulatory Visit | Attending: General Surgery

## 2012-03-11 ENCOUNTER — Encounter (HOSPITAL_BASED_OUTPATIENT_CLINIC_OR_DEPARTMENT_OTHER): Payer: Self-pay

## 2012-03-11 DIAGNOSIS — C50919 Malignant neoplasm of unspecified site of unspecified female breast: Secondary | ICD-10-CM

## 2012-03-11 DIAGNOSIS — C773 Secondary and unspecified malignant neoplasm of axilla and upper limb lymph nodes: Secondary | ICD-10-CM | POA: Insufficient documentation

## 2012-03-11 HISTORY — PX: PORTACATH PLACEMENT: SHX2246

## 2012-03-11 SURGERY — INSERTION, TUNNELED CENTRAL VENOUS DEVICE, WITH PORT
Anesthesia: General | Site: Chest | Laterality: Left | Wound class: Clean

## 2012-03-11 MED ORDER — DEXAMETHASONE SODIUM PHOSPHATE 4 MG/ML IJ SOLN
INTRAMUSCULAR | Status: DC | PRN
  Administered 2012-03-11: 10 mg via INTRAVENOUS

## 2012-03-11 MED ORDER — FENTANYL CITRATE 0.05 MG/ML IJ SOLN
INTRAMUSCULAR | Status: DC | PRN
  Administered 2012-03-11: 100 ug via INTRAVENOUS

## 2012-03-11 MED ORDER — PROPOFOL 10 MG/ML IV EMUL
INTRAVENOUS | Status: DC | PRN
  Administered 2012-03-11: 250 mg via INTRAVENOUS

## 2012-03-11 MED ORDER — HEPARIN SOD (PORK) LOCK FLUSH 100 UNIT/ML IV SOLN
INTRAVENOUS | Status: DC | PRN
  Administered 2012-03-11: 350 [IU] via INTRAVENOUS

## 2012-03-11 MED ORDER — OXYCODONE HCL 5 MG PO TABS
5.0000 mg | ORAL_TABLET | Freq: Once | ORAL | Status: AC | PRN
  Administered 2012-03-11: 5 mg via ORAL

## 2012-03-11 MED ORDER — METOCLOPRAMIDE HCL 5 MG/ML IJ SOLN
INTRAMUSCULAR | Status: DC | PRN
  Administered 2012-03-11: 10 mg via INTRAVENOUS

## 2012-03-11 MED ORDER — VANCOMYCIN HCL IN DEXTROSE 1-5 GM/200ML-% IV SOLN
1000.0000 mg | INTRAVENOUS | Status: AC
  Administered 2012-03-11: 1000 mg via INTRAVENOUS

## 2012-03-11 MED ORDER — METOCLOPRAMIDE HCL 5 MG/ML IJ SOLN: 10.0000 mg | Freq: Once | INTRAMUSCULAR | Status: DC | PRN

## 2012-03-11 MED ORDER — BUPIVACAINE HCL (PF) 0.25 % IJ SOLN
INTRAMUSCULAR | Status: DC | PRN
  Administered 2012-03-11: 10 mL

## 2012-03-11 MED ORDER — EPHEDRINE SULFATE 50 MG/ML IJ SOLN
INTRAMUSCULAR | Status: DC | PRN
  Administered 2012-03-11 (×2): 10 mg via INTRAVENOUS

## 2012-03-11 MED ORDER — LIDOCAINE HCL (CARDIAC) 20 MG/ML IV SOLN
INTRAVENOUS | Status: DC | PRN
  Administered 2012-03-11: 60 mg via INTRAVENOUS

## 2012-03-11 MED ORDER — HYDROMORPHONE HCL PF 1 MG/ML IJ SOLN
0.2500 mg | INTRAMUSCULAR | Status: DC | PRN
  Administered 2012-03-11 (×2): 0.5 mg via INTRAVENOUS

## 2012-03-11 MED ORDER — OXYCODONE-ACETAMINOPHEN 5-325 MG PO TABS: 1.0000 | ORAL_TABLET | ORAL | Status: DC | PRN

## 2012-03-11 MED ORDER — LACTATED RINGERS IV SOLN
INTRAVENOUS | Status: DC
  Administered 2012-03-11 (×3): via INTRAVENOUS

## 2012-03-11 MED ORDER — ONDANSETRON HCL 4 MG/2ML IJ SOLN
INTRAMUSCULAR | Status: DC | PRN
  Administered 2012-03-11: 4 mg via INTRAVENOUS

## 2012-03-11 MED ORDER — MIDAZOLAM HCL 5 MG/5ML IJ SOLN
INTRAMUSCULAR | Status: DC | PRN
  Administered 2012-03-11: 2 mg via INTRAVENOUS

## 2012-03-11 MED ORDER — OXYCODONE HCL 5 MG/5ML PO SOLN: 5.0000 mg | Freq: Once | ORAL | Status: AC | PRN

## 2012-03-11 MED ORDER — HEPARIN (PORCINE) IN NACL 2-0.9 UNIT/ML-% IJ SOLN
INTRAMUSCULAR | Status: DC | PRN
  Administered 2012-03-11: 1 via INTRAVENOUS

## 2012-03-11 SURGICAL SUPPLY — 52 items
BAG DECANTER FOR FLEXI CONT (MISCELLANEOUS) ×2 IMPLANT
BENZOIN TINCTURE PRP APPL 2/3 (GAUZE/BANDAGES/DRESSINGS) IMPLANT
BLADE SURG 11 STRL SS (BLADE) ×2 IMPLANT
BLADE SURG 15 STRL LF DISP TIS (BLADE) ×1 IMPLANT
BLADE SURG 15 STRL SS (BLADE) ×1
CANISTER SUCTION 1200CC (MISCELLANEOUS) IMPLANT
CHLORAPREP W/TINT 26ML (MISCELLANEOUS) ×2 IMPLANT
CLOTH BEACON ORANGE TIMEOUT ST (SAFETY) ×2 IMPLANT
COVER MAYO STAND STRL (DRAPES) ×2 IMPLANT
COVER TABLE BACK 60X90 (DRAPES) ×2 IMPLANT
DECANTER SPIKE VIAL GLASS SM (MISCELLANEOUS) IMPLANT
DERMABOND ADVANCED (GAUZE/BANDAGES/DRESSINGS) ×1
DERMABOND ADVANCED .7 DNX12 (GAUZE/BANDAGES/DRESSINGS) ×1 IMPLANT
DRAPE C-ARM 42X72 X-RAY (DRAPES) ×2 IMPLANT
DRAPE LAPAROSCOPIC ABDOMINAL (DRAPES) ×2 IMPLANT
DRSG TEGADERM 4X4.75 (GAUZE/BANDAGES/DRESSINGS) ×2 IMPLANT
ELECT COATED BLADE 2.86 ST (ELECTRODE) ×2 IMPLANT
ELECT REM PT RETURN 9FT ADLT (ELECTROSURGICAL) ×2
ELECTRODE REM PT RTRN 9FT ADLT (ELECTROSURGICAL) ×1 IMPLANT
GAUZE SPONGE 4X4 12PLY STRL LF (GAUZE/BANDAGES/DRESSINGS) ×2 IMPLANT
GLOVE BIO SURGEON STRL SZ 6.5 (GLOVE) ×2 IMPLANT
GLOVE BIO SURGEON STRL SZ7 (GLOVE) ×2 IMPLANT
GLOVE BIOGEL PI IND STRL 7.0 (GLOVE) ×1 IMPLANT
GLOVE BIOGEL PI IND STRL 7.5 (GLOVE) ×1 IMPLANT
GLOVE BIOGEL PI INDICATOR 7.0 (GLOVE) ×1
GLOVE BIOGEL PI INDICATOR 7.5 (GLOVE) ×1
GOWN PREVENTION PLUS XLARGE (GOWN DISPOSABLE) ×4 IMPLANT
IV HEPARIN 1000UNITS/500ML (IV SOLUTION) ×2 IMPLANT
IV KIT MINILOC 20X1 SAFETY (NEEDLE) IMPLANT
KIT PORT POWER 8FR ISP CVUE (Catheter) ×2 IMPLANT
KIT POWER CATH 8FR (Catheter) IMPLANT
NDL SAFETY ECLIPSE 18X1.5 (NEEDLE) IMPLANT
NEEDLE HYPO 18GX1.5 SHARP (NEEDLE)
NEEDLE HYPO 25X1 1.5 SAFETY (NEEDLE) ×2 IMPLANT
PACK BASIN DAY SURGERY FS (CUSTOM PROCEDURE TRAY) ×2 IMPLANT
PENCIL BUTTON HOLSTER BLD 10FT (ELECTRODE) ×2 IMPLANT
SLEEVE SCD COMPRESS KNEE MED (MISCELLANEOUS) ×2 IMPLANT
STAPLER VISISTAT 35W (STAPLE) ×2 IMPLANT
STRIP CLOSURE SKIN 1/2X4 (GAUZE/BANDAGES/DRESSINGS) IMPLANT
SUT MON AB 4-0 PC3 18 (SUTURE) ×2 IMPLANT
SUT PROLENE 2 0 SH DA (SUTURE) ×2 IMPLANT
SUT SILK 2 0 TIES 17X18 (SUTURE)
SUT SILK 2-0 18XBRD TIE BLK (SUTURE) IMPLANT
SUT VIC AB 3-0 SH 27 (SUTURE) ×1
SUT VIC AB 3-0 SH 27X BRD (SUTURE) ×1 IMPLANT
SYR 5ML LUER SLIP (SYRINGE) ×2 IMPLANT
SYR CONTROL 10ML LL (SYRINGE) ×2 IMPLANT
TOWEL OR 17X24 6PK STRL BLUE (TOWEL DISPOSABLE) ×2 IMPLANT
TOWEL OR NON WOVEN STRL DISP B (DISPOSABLE) ×2 IMPLANT
TUBE CONNECTING 20X1/4 (TUBING) IMPLANT
WATER STERILE IRR 1000ML POUR (IV SOLUTION) IMPLANT
YANKAUER SUCT BULB TIP NO VENT (SUCTIONS) IMPLANT

## 2012-03-11 NOTE — Interval H&P Note (Signed)
History and Physical Interval Note:  03/11/2012 9:08 AM  Holly Parrish  has presented today for surgery, with the diagnosis of breast cancer  The various methods of treatment have been discussed with the patient and family. After consideration of risks, benefits and other options for treatment, the patient has consented to  Procedure(s) (LRB): INSERTION PORT-A-CATH (N/A) as a surgical intervention .  The patient's history has been reviewed, patient examined, no change in status, stable for surgery.  I have reviewed the patient's chart and labs.  Questions were answered to the patient's satisfaction.     [facility] [place]

## 2012-03-11 NOTE — H&P (View-Only) (Signed)
Patient ID: Holly Parrish, female   DOB: 02/25/1971, 40 y.o.   MRN: [id number]  Chief Complaint  Patient presents with  . Other    breast cancer    HPI Holly Parrish is a 40 y.o. female.  Referred by Dr. Steve Reid HPI 40 yof with right breast mass she noted associated with pain over last several weeks.  She states she felt right axillary mass and lately she has noticed pain in that region.  She underwent mammogram with a large 4 cm mass in the right breast at 8 oclock.  Ultrasound showed a 2.7x2.7x2.9 cm mass with enlarged axillary nodes present.  She has also undergone MRI a 3.7x3.7x4.2 cm breast mass on right with satellite nodule measuring 1.2 cm.  There is extensive right axillary and retropectoral adenopathy.  There are no left sided breast or nodal abnormalities. 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  Her nodal biopsy is positive for metastatic cancer. She has no history of any breast complaints in the past.  She presents to [facility] today for options.  Past Medical History  Diagnosis Date  . Lupus   . Migraines   . Joint pain   . Hearing loss   . Bleeding nose   . Bilateral swelling of feet   . Headache   . Breast cancer   . Cancer     Cervical, treated with cryoablation    Past Surgical History  Procedure Date  . Appendectomy   . Tonsillectomy   . Cholecystectomy   . Tubal ligation     Family History  Problem Relation Age of Onset  . Uterine cancer Mother   . Pancreatic cancer Paternal Uncle   . Throat cancer Paternal Uncle   . Breast cancer Maternal Grandmother   . Breast cancer Cousin     Social History History  Substance Use Topics  . Smoking status: Never Smoker   . Smokeless tobacco: Not on file  . Alcohol Use: Yes    Allergies  Allergen Reactions  . Nsaids Anaphylaxis  . Penicillins Anaphylaxis  . Tetanus Toxoids Anaphylaxis  . Hydrogen Peroxide Other (See Comments)    "It burns until I bleed."    Current Outpatient  Prescriptions  Medication Sig Dispense Refill  . aspirin-acetaminophen-caffeine (EXCEDRIN MIGRAINE) 250-250-65 MG per tablet Take 1 tablet by mouth every 6 (six) hours as needed.      . Emu Oil OIL by Does not apply route.        Review of Systems Review of Systems  Constitutional: Positive for fatigue. Negative for fever, chills and unexpected weight change.  HENT: Negative for hearing loss, congestion, sore throat, trouble swallowing and voice change.   Eyes: Negative for visual disturbance.  Respiratory: Negative for cough and wheezing.   Cardiovascular: Negative for chest pain, palpitations and leg swelling.  Gastrointestinal: Negative for nausea, vomiting, abdominal pain, diarrhea, constipation, blood in stool, abdominal distention and anal bleeding.  Genitourinary: Negative for hematuria, vaginal bleeding and difficulty urinating.  Musculoskeletal: Positive for arthralgias.  Skin: Negative for rash and wound.  Neurological: Positive for numbness and headaches. Negative for seizures and syncope.  Hematological: Negative for adenopathy. Does not bruise/bleed easily.  Psychiatric/Behavioral: Negative for confusion.    There were no vitals taken for this visit.  Physical Exam Physical Exam  Vitals reviewed. Constitutional: She appears well-developed and well-nourished.  Eyes: No scleral icterus.  Neck: Neck supple.  Cardiovascular: Normal rate, regular rhythm and normal heart sounds.     Pulmonary/Chest: Effort normal and breath sounds normal. She has no wheezes. She has no rales. Right breast exhibits mass. Right breast exhibits no inverted nipple, no nipple discharge, no skin change and no tenderness. Left breast exhibits no inverted nipple, no mass, no nipple discharge, no skin change and no tenderness. Breasts are symmetrical.    Abdominal: Soft.  Lymphadenopathy:    She has no cervical adenopathy.    She has axillary adenopathy.       Right axillary: Lateral adenopathy  present. No pectoral adenopathy present.       Left axillary: No pectoral and no lateral adenopathy present.      Right: No supraclavicular adenopathy present.       Left: No supraclavicular adenopathy present.    Data Reviewed BILATERAL BREAST MRI WITH AND WITHOUT CONTRAST  Technique: Multiplanar, multisequence MR images of both breasts  were obtained prior to and following the intravenous administration  of 19ml of Multihance. Three dimensional images were evaluated at  the independent DynaCad workstation.  Comparison: Prior mammograms and ultrasound  Findings: Right retro pectoral lymphadenopathy, largest 1.2 cm, and  right axillary lymphadenopathy, largest 2.2 cm in short axis  diameter, is noted. There is diffuse right breast skin thickening,  edema, and enhancement. Dominant right breast eight o'clock  location irregular enhancing mass is noted with enhancement  extending to the surface of the right pectoralis major muscle and  evidence of anterior traction upon the muscle. No underlying chest  wall structure enhancement. The dominant mass demonstrates  internal clip artifact at its anterior aspect and measures 3.7 x  3.7 x 4.2 cm. Predominately plateau type enhancement kinetics are  noted. Immediately adjacent to this dominant right breast eight  o'clock location mass is an irregular satellite nodule or possible  intramammary lymph node measuring 1.2 cm. No contralateral left-  sided lymphadenopathy or internal mammary artery chain  lymphadenopathy on either side.  No other area of abnormal enhancement is identified in either  breast. No left-sided abnormal T2-weighted hyperintensity.  IMPRESSION:  Dominant right breast mass 8 o'clock location with adjacent  satellite nodule versus intramammary node in contiguity with the  dominant measured mass, corresponding to biopsy-proven breast  cancer. Enhancement extends to the surface of the right pectoralis  major muscle with  anterior tenting of the muscle but no MRI  evidence for involvement of underlying chest wall structures.  Bulky right axillary lymphadenopathy compatible with biopsy-proven  metastatic disease.  Right retro pectoral lymphadenopathy.  No MRI evidence for malignancy in the left breast.  RECOMMENDATION:  Treatment plan  BI-RADS CATEGORY 6: Known biopsy-proven malignancy - appropriate  action should be taken.   Assessment    Locally advanced right breast cancer    Plan    We discussed primary systemic therapy to try to downstage this.  I discussed port placement prior to this with risks including but not limited to bleeding, infection, pneumothorax.  I also recommended clip placement in the satellite nodule so that if this ends up being amenable to a lumpectomy we can make sure they are removed. I told her there is 50-60% chance she could undergo lumpectomy after primary therapy.  We will make that decision when chemo complete.   We also discussed the staging and pathophysiology of breast cancer. We discussed all of the different options for treatment for breast cancer including surgery, chemotherapy, radiation therapy, Herceptin, and antiestrogen therapy.   We discussed an axillary node dissection at time of surgery due to positive nodes   now.   We discussed the options for treatment of the breast cancer which included lumpectomy versus a mastectomy. We discussed the performance of the lumpectomy with a wire placement. We discussed a 10-20% chance of a positive margin requiring reexcision in the operating room. We also discussed that she may need radiation therapy or antiestrogen therapy or both if she undergoes lumpectomy. We discussed the mastectomy and the postoperative care for that as well. We discussed that there is no difference in her survival whether she undergoes lumpectomy with radiation therapy or antiestrogen therapy versus a mastectomy. There is a slight difference in the local  recurrence rate being 3-5% with lumpectomy and about 1% with a mastectomy. We will try to shrink this so might be able to do lumpectomy. She is also going to undergo genetic testing as well as staging scans.        Aleana Fifita 03/03/2012, 4:37 PM    

## 2012-03-11 NOTE — Op Note (Signed)
Preoperative diagnosis: Locally advanced right breast cancer Postoperative diagnosis: Same as above Procedure: Left subclavian ClearVue power port insertion Surgeon: Dr. Harden Mo Anesthesia: Gen. With LMA Specimens: None Estimated blood loss: Minimal Drains: None Sponge and needle count correct x2 at end of operation Disposition to recovery in stable condition  Indications: This 41 year old female is recently been diagnosed with a locally advanced right breast cancer. She was seen in multidisciplinary clinic and we decided to begin with primary systemic therapy. She and I discussed a Port-A-Cath placement prior to beginning.  Procedure: After informed consent was obtained the patient was taken to the operating room. She was given 1 g of intravenous vancomycin did arouse she is. Sequential compression devices were placed on her legs prior to beginning. She is in placed under general anesthesia with an LMA. Her arms were then tucked and appropriate padded. An axillary roll was placed. Her chest was then prepped and draped in the standard sterile surgical fashion. A surgical timeout was performed.  I accessed her left subclavian vein on the first try. I then passed a wire and confirmed this to be in position with fluoroscopy. I then made a pocket below this overlying the pectoralis fascia. I then tunneled the line between the venotomy site and the port. I then used the dilator to dilate up the clavipectoral fascia. I then inserted the sheath dilator assembly under fluoroscopy to get a good position. The wire assembly was removed. I then placed the line and removed the peel-away sheath. I pulled the line back to be in the distal cava. I then connected the line to the port. I sutured the port in 3 positions with 2-0 Prolene suture. The port aspirated blood easily and flushed. I packed this with heparin. I then closed this with 3-0 Vicryl, 4 Monocryl, and Dermabond. Final fluoroscopy showed that the  port was in good position and the line was without any kinks in position and the cava. She tolerated this well was extubated and transferred to recovery.

## 2012-03-11 NOTE — Anesthesia Preprocedure Evaluation (Signed)
Anesthesia Evaluation  Patient identified by MRN, date of birth, ID band Patient awake    Reviewed: Allergy & Precautions, H&P , NPO status , Patient's Chart, lab work & pertinent test results, reviewed documented beta blocker date and time   Airway Mallampati: II TM Distance: >3 FB Neck ROM: full    Dental   Pulmonary neg pulmonary ROS,  breath sounds clear to auscultation        Cardiovascular negative cardio ROS  Rhythm:regular     Neuro/Psych  Headaches, negative psych ROS   GI/Hepatic negative GI ROS, Neg liver ROS,   Endo/Other  negative endocrine ROS  Renal/GU negative Renal ROS  negative genitourinary   Musculoskeletal   Abdominal   Peds  Hematology negative hematology ROS (+)   Anesthesia Other Findings See surgeon's H&P   Reproductive/Obstetrics negative OB ROS                           Anesthesia Physical Anesthesia Plan  ASA: III  Anesthesia Plan: General   Post-op Pain Management:    Induction: Intravenous  Airway Management Planned: LMA  Additional Equipment:   Intra-op Plan:   Post-operative Plan: Extubation in OR  Informed Consent: I have reviewed the patients History and Physical, chart, labs and discussed the procedure including the risks, benefits and alternatives for the proposed anesthesia with the patient or authorized representative who has indicated his/her understanding and acceptance.   Dental Advisory Given  Plan Discussed with: CRNA and Surgeon  Anesthesia Plan Comments:         Anesthesia Quick Evaluation

## 2012-03-11 NOTE — Telephone Encounter (Signed)
Patient understands to call scheduler tomorrow to reschedule chemo class for next week.

## 2012-03-11 NOTE — Transfer of Care (Signed)
Immediate Anesthesia Transfer of Care Note  Patient: Holly Parrish  Procedure(s) Performed: Procedure(s) (LRB): INSERTION PORT-A-CATH (Left)  Patient Location: PACU  Anesthesia Type: General  Level of Consciousness: awake, alert  and oriented  Airway & Oxygen Therapy: Patient Spontanous Breathing and Patient connected to face mask oxygen  Post-op Assessment: Report given to PACU RN and Post -op Vital signs reviewed and stable  Post vital signs: Reviewed and stable  Complications: No apparent anesthesia complications

## 2012-03-11 NOTE — Anesthesia Procedure Notes (Signed)
Procedure Name: LMA Insertion Date/Time: 03/11/2012 9:21 AM Performed by: Burna Cash Pre-anesthesia Checklist: Patient identified, Emergency Drugs available, Suction available and Patient being monitored Patient Re-evaluated:Patient Re-evaluated prior to inductionOxygen Delivery Method: Circle System Utilized Preoxygenation: Pre-oxygenation with 100% oxygen Intubation Type: IV induction Ventilation: Mask ventilation without difficulty LMA: LMA inserted LMA Size: 4.0 Number of attempts: 1 Airway Equipment and Method: bite block Placement Confirmation: positive ETCO2 Tube secured with: Tape Dental Injury: Teeth and Oropharynx as per pre-operative assessment

## 2012-03-11 NOTE — Anesthesia Postprocedure Evaluation (Signed)
Anesthesia Post Note  Patient: Holly Parrish  Procedure(s) Performed: Procedure(s) (LRB): INSERTION PORT-A-CATH (Left)  Anesthesia type: General  Patient location: PACU  Post pain: Pain level controlled  Post assessment: Patient's Cardiovascular Status Stable  Last Vitals:  Filed Vitals:   03/11/12 1145  BP: 116/73  Pulse: 86  Temp: 36.5 C  Resp: 16    Post vital signs: Reviewed and stable  Level of consciousness: alert  Complications: No apparent anesthesia complications

## 2012-03-12 ENCOUNTER — Other Ambulatory Visit (HOSPITAL_COMMUNITY): Payer: Self-pay

## 2012-03-12 ENCOUNTER — Encounter (HOSPITAL_COMMUNITY)
Admission: RE | Admit: 2012-03-12 | Discharge: 2012-03-12 | Disposition: A | Discharge status: Home / Self Care | Payer: Medicare Other | Source: Ambulatory Visit | Attending: Oncology | Admitting: Oncology

## 2012-03-12 DIAGNOSIS — C50519 Malignant neoplasm of lower-outer quadrant of unspecified female breast: Secondary | ICD-10-CM | POA: Insufficient documentation

## 2012-03-12 DIAGNOSIS — Z9089 Acquired absence of other organs: Secondary | ICD-10-CM | POA: Insufficient documentation

## 2012-03-12 DIAGNOSIS — R599 Enlarged lymph nodes, unspecified: Secondary | ICD-10-CM | POA: Insufficient documentation

## 2012-03-12 MED ORDER — FLUDEOXYGLUCOSE F - 18 (FDG) INJECTION
15.3000 | Freq: Once | INTRAVENOUS | Status: AC | PRN
  Administered 2012-03-12: 15.3 via INTRAVENOUS

## 2012-03-15 ENCOUNTER — Encounter (HOSPITAL_BASED_OUTPATIENT_CLINIC_OR_DEPARTMENT_OTHER): Payer: Self-pay | Admitting: General Surgery

## 2012-03-18 ENCOUNTER — Other Ambulatory Visit: Payer: Self-pay | Admitting: *Deleted

## 2012-03-18 ENCOUNTER — Other Ambulatory Visit: Payer: No Typology Code available for payment source | Admitting: Lab

## 2012-03-18 ENCOUNTER — Ambulatory Visit (HOSPITAL_BASED_OUTPATIENT_CLINIC_OR_DEPARTMENT_OTHER): Payer: Medicare Other | Admitting: Oncology

## 2012-03-18 ENCOUNTER — Encounter: Payer: Self-pay | Admitting: *Deleted

## 2012-03-18 ENCOUNTER — Encounter: Payer: Self-pay | Admitting: Oncology

## 2012-03-18 VITALS — BP 142/63 | HR 57 | Temp 98.4°F | Resp 20 | Ht 62.0 in | Wt 199.4 lb

## 2012-03-18 DIAGNOSIS — Z170 Estrogen receptor positive status [ER+]: Secondary | ICD-10-CM

## 2012-03-18 DIAGNOSIS — C50519 Malignant neoplasm of lower-outer quadrant of unspecified female breast: Secondary | ICD-10-CM

## 2012-03-18 MED ORDER — LORAZEPAM 0.5 MG PO TABS: 0.5000 mg | ORAL_TABLET | Freq: Four times a day (QID) | ORAL | Status: DC | PRN

## 2012-03-18 MED ORDER — PROCHLORPERAZINE 25 MG RE SUPP: 25.0000 mg | Freq: Two times a day (BID) | RECTAL | Status: DC | PRN

## 2012-03-18 MED ORDER — ONDANSETRON HCL 8 MG PO TABS: ORAL_TABLET | ORAL | Status: DC

## 2012-03-18 MED ORDER — DEXAMETHASONE 4 MG PO TABS: ORAL_TABLET | ORAL | Status: DC

## 2012-03-18 MED ORDER — LIDOCAINE-PRILOCAINE 2.5-2.5 % EX CREA: TOPICAL_CREAM | CUTANEOUS | Status: DC | PRN

## 2012-03-18 MED ORDER — PROCHLORPERAZINE MALEATE 10 MG PO TABS: 10.0000 mg | ORAL_TABLET | Freq: Four times a day (QID) | ORAL | Status: DC | PRN

## 2012-03-18 MED ORDER — OXYCODONE-ACETAMINOPHEN 5-325 MG PO TABS: 1.0000 | ORAL_TABLET | ORAL | Status: AC | PRN

## 2012-03-18 NOTE — Progress Notes (Signed)
Holly Parrish [id number] 04/17/1971 40 y.o. 03/18/2012 1:21 PM  CC none  No primary provider on file. No primary provider on file.  REASON FOR CONSULTATION:  Breast cancer Patient was seen in the Multidisciplinary Breast Clinic for discussion of her treatment options. She was seen by Dr. Pierce Crane, Rad[facility]  STAGE:   Cancer of lower-outer quadrant of female breast   Primary site: Breast (Right)   Staging method: AJCC 7th Edition   Clinical: Stage IIB (T2, N1, cM0)   Summary: Stage IIB (T2, N1, cM0)  REFERRING PHYSICIAN: None  HISTORY OF PRESENT ILLNESS:  Holly Parrish is a 41 y.o. female.  Living in [place] referred for evaluation of locally advanced breast cancer. She noted a mass in her right breast about 5 months ago. She did not have any healthcare an insurance and so delayed getting this looked after. She ultimately underwent a mammogram on 02/16/2012. There showed a firm palpable mass right breast 8:00 position measuring 4 cm there was an additional 3 cm lower right axillary lymph node. Ultrasound confirmed the presence of this mass in the breast measuring 2.7 x 2.7 x 2.9 cm. There were 2 large right lower right axillary lymph nodes largest one measuring 4 cm a smaller one measuring 2 x 3.1 cm. She underwent biopsies of both these areas. 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 This is also the ER and PR positive.  Past Medical History: Past Medical History  Diagnosis Date  . Lupus    diagnosed while living in [place]. She had his diagnosis made in 1998 at that time was placed on prednisone and she ultimately came off her medications and start using a more holistic medicines. She does not have any exacerbations  of lupus. She denies any manifestations of this.    . Migraines   . Joint pain   . Hearing loss   . Bleeding nose   . Bilateral swelling of feet   . Headache   .  Breast cancer   . Cancer     Cervical, treated with cryoablation, 2009, no recent Pap smear since  2011     Past Surgical History: Past Surgical History  Procedure Date  . Appendectomy   . Tonsillectomy   . Cholecystectomy   . Tubal ligation   . 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 Portacath placement 03/11/2012    Procedure: INSERTION PORT-A-CATH;  Surgeon: Emelia Loron, MD;  Location: [facility];  Service: General;  Laterality: Left;   she had a hysterectomy for apparent respectable rupture in 1997 when she was 3 months pregnant she had an unremarkable recovery. She is also hospitalized with what sounds like pancreatitis in 2009 and ultimately had her gallbladder removed.  Family History: Family History  Problem Relation Age of Onset  . Uterine cancer Mother   . Pancreatic cancer Paternal Uncle   . Throat cancer Paternal Uncle   . Breast cancer Maternal Grandmother   27s    . Breast cancer                                                                     3 Cousins ages 21,  42 and 50-52     Social History History  Substance Use Topics  . Smoking status: Never Smoker   . Smokeless tobacco: Not on file  . 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 She is sensitive to [facility] the past year where she has a nephew in [place] and is working as a Fish farm manager here. All her family is in [place]. She has 4 children all of whom in [place] as well. She's been married twice first time performance a second time for 18 years and recently has been divorced.  Allergies: Allergies  Allergen Reactions  . Nsaids Anaphylaxis  . Penicillins Anaphylaxis  . Tetanus Toxoids Anaphylaxis  . Hydrogen Peroxide Other (See Comments)    "It burns until I bleed."    Current Medications: Current Outpatient Prescriptions  Medication Sig Dispense Refill    . aspirin-acetaminophen-caffeine (EXCEDRIN MIGRAINE) 250-250-65 MG per tablet Take 1 tablet by mouth every 6 (six) hours as needed.      . Emu Oil OIL by Does not apply route.        OB/GYN History: G5 P5 one daughter died at age 50, menarche at age 22 age of first live birth is 78 no recent history of birth control pill use has normal menses.  Fertility Discussion: No Prior History of Cancer: In situ carcinoma the cervix  Health Maintenance:  Colonoscopy yes Bone Density no Last PAP smear last 2011  ECOG PERFORMANCE STATUS: 0 - Asymptomatic  Genetic Counseling/testing: To be scheduled  REVIEW OF SYSTEMS:  She has migratory aches and pains. She has occasional migraines. She denies blurry vision or double vision. Appetite good weight is stable she denies night sweats or fevers. Denies any chest pain nausea vomiting numbness tingling in hands and feet.  PHYSICAL EXAMINATION: Blood pressure 142/63, pulse 57, temperature 98.4 F (36.9 C), temperature source Oral, resp. rate 20, height 5\' 2"  (1.575 m), weight 199 lb 6.4 oz (90.447 kg), last menstrual period 02/25/2012.  HEENT:  Sclerae anicteric, conjunctivae pink.  Oropharynx clear.  No mucositis or candidiasis.  Nodes:  No cervical, supraclavicular, or axillary lymphadenopathy palpated.  Breast Exam:  Right breast has a 3-4 cm mass appreciated in the lower portion of the breast. This does not appear to be fixed underlying fascia. There is palpable tender lymph nodes in the right axilla measuring at least 3 cm.   Left breast is benign.  No masses, discharge, skin change, or nipple inversion..  Lungs:  Clear to auscultation bilaterally.  No crackles, rhonchi, or wheezes.  Heart:  Regular rate and rhythm.  Abdomen:  Soft, nontender.  Positive bowel sounds.  No organomegaly or masses palpated.  Musculoskeletal:  No focal spinal tenderness to palpation.  Extremities:  Benign.  No peripheral edema or cyanosis.  Skin:  Benign.  Neuro:  Nonfocal.       STUDIES/RESULTS: US Breast Right  02/24/2012  **ADDENDUM** CREATED: 02/16/2012 15:32:07  CORRECTED REPORT  For technical reasons, the initial report is incorrect and this addended report is the correct report.  *RADIOLOGY REPORT*  Clinical Data:  The patient notes a palpable mass within the lateral right breast and also within the right axilla.  There is a family history of breast cancer in her mother at age 70 and and three paternal cousins and a paternal grandmother.  There is no family history of  ovarian cancer.  The patient has a personal history of cervical cancer.  DIGITAL DIAGNOSTIC BILATERAL BASELINE MAMMOGRAM WITH CAD AND RIGHT BREAST ULTRASOUND:  Comparison:  None.  Findings:  There is a scattered fibroglandular pattern.  There is an irregularly marginated mass with spiculation located within the lateral right breast at approximately 9 o'clock position which by mammography measures 4 cm in greatest dimension.  In addition, there are at least two enlarged right axillary lymph nodes present. There is no abnormality within the left breast. Mammographic images were processed with CAD.  On physical exam, there is a firm palpable mass located within the right breast at the 8 o'clock position 4-5 cm from the nipple.  By physical examination this measures approximately 4 cm in size.  In addition, there is a palpable 3 cm low right axillary lymph node present. There is no skin thickening.  Ultrasound is performed, showing an irregular hypoechoic mass located within the right breast at the 8 o'clock position 4 cm from nipple corresponding to the palpable finding with posterior acoustical shadowing.  This measures 2.7 x 2.7 x 2.9 cm in size. There are two enlarged lower right axillary lymph nodes present . The largest lymph node measures 4 cm in greatest dimension.  The smaller more superiorly located lymph node measures 3.1 cm in size. These are worrisome for metastatic lymph nodes.  IMPRESSION: Large (4  cm) palpable mass located within the right breast at the 8 o'clock position worrisome for primary carcinoma of the breast. This is associated with two enlarged abnormal appearing right lower axillary lymph nodes worrisome for metastatic carcinoma.  I have discussed ultrasound-guided core biopsy of the mass and abnormal axillary lymph nodes.  This will be scheduled following enrollment of the patient in the [facility]CP program (she has no health insurance).  RECOMMENDATION: Ultrasound guided core biopsy as discussed above.  BI-RADS CATEGORY 5:  Highly suggestive of malignancy - appropriate action should be taken.   **END ADDENDUM** SIGNED BY: Rolla Plate, M.D.   02/24/2012  *RADIOLOGY REPORT*  Clinical Data:  Recall from screening mammography.  DIGITAL DIAGNOSTIC LEFT BREAST MAMMOGRAM WITH CAD  Comparison:  Prior studies dating back to 09/15/2005.  Findings:  There is a heterogeneously dense parenchymal pattern. There is no persistent mass or distortion.  The appearance noted on the screening study is consistent with a summation shadow.Mammographic images were processed with CAD.  IMPRESSION: No persistent abnormality on additional evaluation of the left breast.  The appearance noted on the screening study is consistent with a summation shadow.  RECOMMENDATION: Screening mammography in 1 year.  BI-RADS CATEGORY 1:  Negative.  Original Report Authenticated By: Rolla Plate, M.D.   Mr Breast Bilateral W Wo Contrast  03/02/2012  *RADIOLOGY REPORT*  Clinical Data: Newly-diagnosed right breast eight o'clock location invasive mammary carcinoma with right axillary metastatic disease. History of breast cancer in the patient's mother at age 36.  BUN and creatinine were obtained on site at [facility] at [address] Results:  BUN 14 mg/dL,  Creatinine 0.9 mg/dL.  BILATERAL BREAST MRI WITH AND WITHOUT CONTRAST  Technique: Multiplanar, multisequence MR images of both breasts were obtained prior to and  following the intravenous administration of 19ml of Multihance.  Three dimensional images were evaluated at the independent DynaCad workstation.  Comparison:  Prior mammograms and ultrasound  Findings: Right retro pectoral lymphadenopathy, largest 1.2 cm, and right axillary lymphadenopathy, largest 2.2 cm in short axis diameter, is noted.  There is diffuse right  breast skin thickening, edema, and enhancement.  Dominant right breast eight o'clock location irregular enhancing mass is noted with enhancement extending to the surface of the right pectoralis major muscle and evidence of anterior traction upon the muscle.  No underlying chest wall structure enhancement.  The dominant mass demonstrates internal clip artifact at its anterior aspect and measures 3.7 x 3.7 x 4.2 cm.  Predominately plateau type enhancement kinetics are noted.  Immediately adjacent to this dominant right breast eight o'clock location mass is an irregular satellite nodule or possible intramammary lymph node measuring 1.2 cm.  No contralateral left- sided lymphadenopathy or internal mammary artery chain lymphadenopathy on either side.  No other area of abnormal enhancement is identified in either breast.  No left-sided abnormal T2-weighted hyperintensity.  IMPRESSION: Dominant right breast mass 8 o'clock location with adjacent satellite nodule versus intramammary node in contiguity with the dominant measured mass, corresponding to biopsy-proven breast cancer.  Enhancement extends to the surface of the right pectoralis major muscle with anterior tenting of the muscle but no MRI evidence for involvement of underlying chest wall structures.  Bulky right axillary lymphadenopathy compatible with biopsy-proven metastatic disease.  Right retro pectoral lymphadenopathy.  No MRI evidence for malignancy in the left breast.  RECOMMENDATION: Treatment plan  BI-RADS CATEGORY 6:  Known biopsy-proven malignancy - appropriate action should be taken.   THREE-DIMENSIONAL MR IMAGE RENDERING ON INDEPENDENT WORKSTATION:  Three-dimensional MR images were rendered by post-processing of the original MR data on an independent workstation.  The three- dimensional MR images were interpreted, and findings were reported in the accompanying complete MRI report for this study.  Original Report Authenticated By: Harrel Lemon, M.D.   [place] Core Biopsy  02/25/2012  *RADIOLOGY REPORT*  Clinical Data:  Palpable right breast mass with right axillary adenopathy.  ULTRASOUND GUIDED CORE BIOPSY OF RIGHT AXILLARY LYMPH NODE  Comparison: Previous exams  I met with the patient and we discussed the procedure of ultrasound- guided biopsy, including benefits and alternatives.  We discussed the high likelihood of a successful procedure. We discussed the risks of the procedure, including infection, bleeding, tissue injury, clip migration, and inadequate sampling.  Informed written consent was given.  Using sterile technique 2% lidocaine, ultrasound guidance and a 14 gauge automated biopsy device, biopsy was performed of an enlarged right axillary lymph node using aninferior approach. No clip was placed within the lymph node.  IMPRESSION: Ultrasound guided biopsy of enlarged right axillary lymph node as discussed above.  No apparent complications.  Original Report Authenticated By: Rolla Plate, M.D.   [place] Core Biopsy  02/25/2012  *RADIOLOGY REPORT*  Clinical Data:  Right breast mass with right axillary adenopathy.  ULTRASOUND GUIDED CORE BIOPSY OF THE right BREAST  Comparison: Previous exams  I met with the patient and we discussed the procedure of ultrasound- guided biopsy, including benefits and alternatives.  We discussed the high likelihood of a successful procedure. We discussed the risks of the procedure, including infection, bleeding, tissue injury, clip migration, and inadequate sampling.  Informed written consent was given.  Using sterile technique 2% lidocaine, ultrasound  guidance and a 14 gauge automated biopsy device, biopsy was performed of the mass located within the right breast at the 8 o'clock position using a inferior lateral approach.  At the conclusion of the procedure a ribbon shaped tissue marker clip was deployed into the biopsy cavity.  Follow up 2 view mammogram was performed and dictated separately.  IMPRESSION: Ultrasound guided biopsy of the right breast mass  located at the 8 o'clock position as discussed above.  No apparent complications.  Original Report Authenticated By: Rolla Plate, M.D.   Mm Digital Diagnostic Bilat  02/16/2012  **ADDENDUM** CREATED: 02/16/2012 15:32:07  CORRECTED REPORT  For technical reasons, the initial report is incorrect and this addended report is the correct report.  *RADIOLOGY REPORT*  Clinical Data:  The patient notes a palpable mass within the lateral right breast and also within the right axilla.  There is a family history of breast cancer in her mother at age 28 and and three paternal cousins and a paternal grandmother.  There is no family history of ovarian cancer.  The patient has a personal history of cervical cancer.  DIGITAL DIAGNOSTIC BILATERAL BASELINE MAMMOGRAM WITH CAD AND RIGHT BREAST ULTRASOUND:  Comparison:  None.  Findings:  There is a scattered fibroglandular pattern.  There is an irregularly marginated mass with spiculation located within the lateral right breast at approximately 9 o'clock position which by mammography measures 4 cm in greatest dimension.  In addition, there are at least two enlarged right axillary lymph nodes present. There is no abnormality within the left breast.  Mammographic images were processed with CAD.  On physical exam, there is a firm palpable mass located within the right breast at the 8 o'clock position 4-5 cm from the nipple.  By physical examination this measures approximately 4 cm in size.  In addition, there is a palpable 3 cm low right axillary lymph node present. There is no  skin thickening.  Ultrasound is performed, showing an irregular hypoechoic mass located within the right breast at the 8 o'clock position 4 cm from nipple corresponding to the palpable finding with posterior acoustical shadowing.  This measures 2.7 x 2.7 x 2.9 cm in size. There are two enlarged lower right axillary lymph nodes present . The largest lymph node measures 4 cm in greatest dimension.  The smaller more superiorly located lymph node measures 3.1 cm in size. These are worrisome for metastatic lymph nodes.  IMPRESSION: Large (4 cm) palpable mass located within the right breast at the 8 o'clock position worrisome for primary carcinoma of the breast. This is associated with two enlarged abnormal appearing right lower axillary lymph nodes worrisome for metastatic carcinoma.  I have discussed ultrasound-guided core biopsy of the mass and abnormal axillary lymph nodes.  This will be scheduled following enrollment of the patient in the BCCCP program (she has no health insurance).  RECOMMENDATION: Ultrasound guided core biopsy as discussed above.  BI-RADS CATEGORY 5:  Highly suggestive of malignancy - appropriate action should be taken.   **END ADDENDUM** SIGNED BY: Rolla Plate, M.D.   02/16/2012  *RADIOLOGY REPORT*  Clinical Data:  Recall from screening mammography.  DIGITAL DIAGNOSTIC LEFT BREAST MAMMOGRAM WITH CAD  Comparison:  Prior studies dating back to 09/15/2005.  Findings:  There is a heterogeneously dense parenchymal pattern. There is no persistent mass or distortion.  The appearance noted on the screening study is consistent with a summation shadow.Mammographic images were processed with CAD.  IMPRESSION: No persistent abnormality on additional evaluation of the left breast.  The appearance noted on the screening study is consistent with a summation shadow.  RECOMMENDATION: Screening mammography in 1 year.  BI-RADS CATEGORY 1:  Negative.  Original Report Authenticated By: Rolla Plate, M.D.    Mm Digital Diagnostic Unilat R  02/25/2012  *RADIOLOGY REPORT*  Clinical Data:  Post right breast ultrasound guided core biopsy.  DIGITAL DIAGNOSTIC RIGHT BREAST MAMMOGRAM  Comparison:  Ultrasound  Findings:  Films are performed following the palpable mass located within the right breast at the 8 o'clock position. guided biopsy of the ribbon shaped clip is associated the anterior aspect of the mass.  IMPRESSION: The ribbon shaped clip is associated with the anterior aspect of the mass located within the right breast at the 8 o'clock position.  Original Report Authenticated By: Rolla Plate, M.D.   Mm Radiologist Eval And Mgmt  02/26/2012  *RADIOLOGY REPORT*  ESTABLISHED PATIENT OFFICE VISIT - LEVEL II [phone])  Chief Complaint:  Status post ultrasound-guided core needle biopsy of a right breast mass and right axillary lymph node.  History:  Status post ultrasound-guided core needle biopsy of a right breast mass and right axillary lymph node.  The patient reports no pain at either biopsy site today.  Exam:  There is a small area of bruising at the site of biopsy in the lateral right breast with no palpable hematoma.  There is no bruising or palpable hematoma at the location of the axillary lymph node biopsy.  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  Assessment and Plan:  The pathological diagnoses are concordant with imaging findings.  The diagnoses were discussed with the patient and her questions were answered.  She will be contacted by the Multidisciplinary Clinic for an appointment.  She is going to enroll in [place] before obtaining a breast MR.  Original Report Authenticated By: Darrol Angel, M.D.     LABS:    Chemistry      Component Value Date/Time   NA 139 03/03/2012 1205   K 3.8 03/03/2012 1205   CL 102 03/03/2012 1205   CO2 29 03/03/2012 1205   BUN 15 03/03/2012 1205    CREATININE 0.87 03/03/2012 1205      Component Value Date/Time   CALCIUM 9.3 03/03/2012 1205   ALKPHOS 86 03/03/2012 1205   AST 17 03/03/2012 1205   ALT 11 03/03/2012 1205   BILITOT 0.2* 03/03/2012 1205      Lab Results  Component Value Date   WBC 7.2 03/03/2012   HGB 15.2* 03/11/2012   HCT 42.4 03/03/2012   MCV 98.7 03/03/2012   PLT 431* 03/03/2012       PATHOLOGY: As above  ASSESSMENT    Locally advanced HER-2 positive breast cancer  Clinical Trial Eligibility: No Multidisciplinary conference discussion yes    PLAN:    This patient has locally advanced HER-2 positive breast cancer. I discussed the natural history of HER-2 positive disease and the use of Herceptin based therapy. I think she is an excellent candidate for neoadjuvant treatment. Plan is to go staging scans, port placement, chemotherapy teachuing, genetic testing, as well as 2-D echo. We will see her then see her again in followup to initiate treatment. She has limited social support in the area as well. This might be an issue going forward.       Discussion: Patient is being treated per NCCN breast cancer care guidelines appropriate for stage.IIb   Thank you so much for allowing me to participate in the care of [facility]. I will continue to follow up the patient with you and assist in her care.  All questions were answered. The patient knows to call the clinic with any problems, questions or concerns. We can certainly see the patient much sooner if necessary.  I spent 30 minutes counseling the patient face  to face. The total time spent in the appointment was 60 minutes.     Pierce Crane M.D. FRCP C. 03/18/2012, 1:21 PM

## 2012-03-18 NOTE — Patient Instructions (Signed)
CHEMO TO START 8/30,  INJECTION THE FOLLOWING DAY, AND FOLLOW UP VISIT 1 WK LATER WITH NANCY RUDOLPH, THE Freight forwarder.Marland Kitchen CHEMO CLASS BEFORE THAT DATE MRI OF THE PELVIS NEXT WEEK PLEASE REVIEW SCHEDULE WHEN YOU GET IT.Marland Kitchen PLEASE CALL us @ [phone] WITH ANY QUESTIONS.

## 2012-03-18 NOTE — Progress Notes (Signed)
Holly Parrish provided her most recent pay stub. Based on the information provided, approved for the one time grants [employer] $600.00 and [facility] $400.00. Available balance will be noted in the [address]

## 2012-03-18 NOTE — Progress Notes (Signed)
[facility] Brief Psychosocial Assessment Clinical Social Work  Clinical Social Work was referred for assessment of psychosocial needs.  Clinical Social Worker met with patient in office at [facility] to offer support and assess for needs.  Pt was recently diagnosed with breast cancer and expressed increased financial concerns.  Pt is in the process of appealing her disability, and looking for permanent housing.  CSW and pt discussed disability appeal and CSW encouraged pt to submit her most recent medical records.  Pt completed Authorization for Disclosure form; which will be placed in her medical record.  Pt previously met with the financial advocate and was approved for financial assistance.  CSW and pt discussed other resources and support services at [facility].  Pt reported that her children were in [place] and her goal was to return.  Pt was visibly emotional and became tearful at times.  CSW validated the pt's feelings and encouraged her to call with any needs or concerns.  CSW and pt plan to meet at her next appointment to explore possible resources for financial assistance, and process emotional needs and concerns.          Patient identified barriers to care as  Housing family concerns emotional concerns financial concerns   Tamala Julian, MSW, LCSW Clinical Social Worker [facility] [phone]

## 2012-03-19 ENCOUNTER — Telehealth: Payer: Self-pay | Admitting: *Deleted

## 2012-03-19 NOTE — Telephone Encounter (Signed)
Gave patient appointment for [phone] thru 05-29-2012 sent michelle email to set up patients treatment

## 2012-03-22 ENCOUNTER — Telehealth: Payer: Self-pay | Admitting: *Deleted

## 2012-03-22 NOTE — Telephone Encounter (Signed)
Per staff message and POF I have scheduled appts.  [name]  

## 2012-03-23 ENCOUNTER — Encounter: Payer: Self-pay | Admitting: *Deleted

## 2012-03-23 ENCOUNTER — Telehealth: Payer: Self-pay | Admitting: *Deleted

## 2012-03-23 ENCOUNTER — Other Ambulatory Visit: Payer: No Typology Code available for payment source

## 2012-03-23 NOTE — Telephone Encounter (Signed)
Telephoned patient at home # and left message to return call to [facility] 

## 2012-03-24 ENCOUNTER — Other Ambulatory Visit: Payer: Self-pay | Admitting: *Deleted

## 2012-03-24 MED ORDER — PROMETHAZINE HCL 25 MG PO TABS: 25.0000 mg | ORAL_TABLET | Freq: Four times a day (QID) | ORAL | Status: DC | PRN

## 2012-03-26 ENCOUNTER — Ambulatory Visit (HOSPITAL_BASED_OUTPATIENT_CLINIC_OR_DEPARTMENT_OTHER): Payer: No Typology Code available for payment source

## 2012-03-26 ENCOUNTER — Other Ambulatory Visit: Payer: Self-pay | Admitting: Oncology

## 2012-03-26 ENCOUNTER — Encounter: Payer: Self-pay | Admitting: *Deleted

## 2012-03-26 ENCOUNTER — Telehealth: Payer: Self-pay | Admitting: *Deleted

## 2012-03-26 ENCOUNTER — Other Ambulatory Visit: Payer: Self-pay | Admitting: Emergency Medicine

## 2012-03-26 ENCOUNTER — Other Ambulatory Visit (HOSPITAL_BASED_OUTPATIENT_CLINIC_OR_DEPARTMENT_OTHER): Payer: No Typology Code available for payment source | Admitting: Lab

## 2012-03-26 VITALS — BP 107/73 | HR 57 | Temp 97.3°F | Resp 20

## 2012-03-26 DIAGNOSIS — C50519 Malignant neoplasm of lower-outer quadrant of unspecified female breast: Secondary | ICD-10-CM

## 2012-03-26 DIAGNOSIS — Z5111 Encounter for antineoplastic chemotherapy: Secondary | ICD-10-CM

## 2012-03-26 DIAGNOSIS — Z5112 Encounter for antineoplastic immunotherapy: Secondary | ICD-10-CM

## 2012-03-26 LAB — CBC WITH DIFFERENTIAL/PLATELET
EOS%: 0 % (ref 0.0–7.0)
Eosinophils Absolute: 0 10*3/uL (ref 0.0–0.5)
LYMPH%: 14.9 % (ref 14.0–49.7)
MCH: 32.1 pg (ref 25.1–34.0)
MCV: 93.1 fL (ref 79.5–101.0)
MONO%: 4.1 % (ref 0.0–14.0)
NEUT#: 10 10*3/uL — ABNORMAL HIGH (ref 1.5–6.5)
Platelets: 419 10*3/uL — ABNORMAL HIGH (ref 145–400)
RBC: 4.21 10*6/uL (ref 3.70–5.45)
nRBC: 0 % (ref 0–0)

## 2012-03-26 LAB — COMPREHENSIVE METABOLIC PANEL (CC13)
ALT: 23 U/L (ref 0–55)
Alkaline Phosphatase: 87 U/L (ref 40–150)
CO2: 22 mEq/L (ref 22–29)
Creatinine: 0.9 mg/dL (ref 0.6–1.1)
Glucose: 138 mg/dl — ABNORMAL HIGH (ref 70–99)
Total Bilirubin: 0.4 mg/dL (ref 0.20–1.20)

## 2012-03-26 MED ORDER — SODIUM CHLORIDE 0.9 % IV SOLN
Freq: Once | INTRAVENOUS | Status: AC
  Administered 2012-03-26: 13:00:00 via INTRAVENOUS

## 2012-03-26 MED ORDER — TRASTUZUMAB CHEMO INJECTION 440 MG
4.0000 mg/kg | Freq: Once | INTRAVENOUS | Status: AC
  Administered 2012-03-26: 357 mg via INTRAVENOUS
  Filled 2012-03-26: qty 17

## 2012-03-26 MED ORDER — SODIUM CHLORIDE 0.9 % IJ SOLN
10.0000 mL | INTRAMUSCULAR | Status: DC | PRN
  Administered 2012-03-26: 10 mL
  Filled 2012-03-26: qty 10

## 2012-03-26 MED ORDER — DEXAMETHASONE SODIUM PHOSPHATE 4 MG/ML IJ SOLN
20.0000 mg | Freq: Once | INTRAMUSCULAR | Status: AC
  Administered 2012-03-26: 20 mg via INTRAVENOUS

## 2012-03-26 MED ORDER — DEXTROSE 5 % IV SOLN
75.0000 mg/m2 | Freq: Once | INTRAVENOUS | Status: AC
  Administered 2012-03-26: 150 mg via INTRAVENOUS
  Filled 2012-03-26: qty 15

## 2012-03-26 MED ORDER — ACETAMINOPHEN 325 MG PO TABS
650.0000 mg | ORAL_TABLET | Freq: Once | ORAL | Status: AC
  Administered 2012-03-26: 650 mg via ORAL

## 2012-03-26 MED ORDER — ONDANSETRON 16 MG/50ML IVPB (CHCC)
16.0000 mg | Freq: Once | INTRAVENOUS | Status: AC
  Administered 2012-03-26: 16 mg via INTRAVENOUS

## 2012-03-26 MED ORDER — HEPARIN SOD (PORK) LOCK FLUSH 100 UNIT/ML IV SOLN
500.0000 [IU] | Freq: Once | INTRAVENOUS | Status: AC | PRN
  Administered 2012-03-26: 500 [IU]
  Filled 2012-03-26: qty 5

## 2012-03-26 MED ORDER — SODIUM CHLORIDE 0.9 % IV SOLN
720.0000 mg | Freq: Once | INTRAVENOUS | Status: AC
  Administered 2012-03-26: 720 mg via INTRAVENOUS
  Filled 2012-03-26: qty 72

## 2012-03-26 MED ORDER — DIPHENHYDRAMINE HCL 25 MG PO CAPS
50.0000 mg | ORAL_CAPSULE | Freq: Once | ORAL | Status: AC
  Administered 2012-03-26: 50 mg via ORAL

## 2012-03-26 NOTE — Progress Notes (Signed)
[facility] Clinical Social Work  Visual merchandiser met with pt in the treatment room at [facility] to offer continued support and assess for any additional needs or concerns.  Pt stated she was doing well and continuing to look for additional housing.  Pt also submitted updated medical records to [employer].  CSW and pt discussed adjusting to her diagnosis and treatment, and the importance of self care.  CSW encouraged pt to call with any additional questions or concerns.    Tamala Julian, MSW, LCSW Clinical Social Worker [facility] [phone]

## 2012-03-26 NOTE — Telephone Encounter (Signed)
Telephoned patient at home # to return call to [facility]

## 2012-03-26 NOTE — Patient Instructions (Addendum)
[facility] Discharge Instructions for Patients Receiving Chemotherapy  Today you received the following chemotherapy agents Taxotere, Carboplatin, Herceptin  To help prevent nausea and vomiting after your treatment, we encourage you to take your nausea medication as directed by MD  If you develop nausea and vomiting that is not controlled by your nausea medication, call the clinic. If it is after clinic hours your family physician or the after hours number for the clinic or go to the Emergency Department.   BELOW ARE SYMPTOMS THAT SHOULD BE REPORTED IMMEDIATELY:  *FEVER GREATER THAN 100.5 F  *CHILLS WITH OR WITHOUT FEVER  NAUSEA AND VOMITING THAT IS NOT CONTROLLED WITH YOUR NAUSEA MEDICATION  *UNUSUAL SHORTNESS OF BREATH  *UNUSUAL BRUISING OR BLEEDING  TENDERNESS IN MOUTH AND THROAT WITH OR WITHOUT PRESENCE OF ULCERS  *URINARY PROBLEMS  *BOWEL PROBLEMS  UNUSUAL RASH Items with * indicate a potential emergency and should be followed up as soon as possible.  One of the nurses will contact you 24 hours after your treatment. Please let the nurse know about any problems that you may have experienced. Feel free to call the clinic you have any questions or concerns. The clinic phone number is [phone].   I have been informed and understand all the instructions given to me. I know to contact the clinic, my physician, or go to the Emergency Department if any problems should occur. I do not have any questions at this time, but understand that I may call the clinic during office hours   should I have any questions or need assistance in obtaining follow up care.    __________________________________________  _____________  __________ Signature of Patient or Authorized Representative            Date                   Time    __________________________________________ Nurse's Signature

## 2012-03-26 NOTE — Progress Notes (Signed)
Patient's creatinine today is 0.85, not different from that obtained 08/07 (o.87) The carbo dose calculates to 720 mg.  Entered

## 2012-03-26 NOTE — Telephone Encounter (Signed)
Patient returned call to [facility]. Patient states no longer has Medicare. Patient states is being audited by Medicare.

## 2012-03-27 ENCOUNTER — Ambulatory Visit (HOSPITAL_BASED_OUTPATIENT_CLINIC_OR_DEPARTMENT_OTHER): Payer: No Typology Code available for payment source

## 2012-03-27 VITALS — BP 127/79 | HR 52 | Temp 97.7°F | Resp 20

## 2012-03-27 DIAGNOSIS — C50519 Malignant neoplasm of lower-outer quadrant of unspecified female breast: Secondary | ICD-10-CM

## 2012-03-27 DIAGNOSIS — Z5189 Encounter for other specified aftercare: Secondary | ICD-10-CM

## 2012-03-27 DIAGNOSIS — C773 Secondary and unspecified malignant neoplasm of axilla and upper limb lymph nodes: Secondary | ICD-10-CM

## 2012-03-27 MED ORDER — PEGFILGRASTIM INJECTION 6 MG/0.6ML
6.0000 mg | Freq: Once | SUBCUTANEOUS | Status: AC
  Administered 2012-03-27: 6 mg via SUBCUTANEOUS

## 2012-03-30 ENCOUNTER — Other Ambulatory Visit: Payer: Self-pay | Admitting: Certified Registered Nurse Anesthetist

## 2012-04-01 ENCOUNTER — Other Ambulatory Visit: Payer: Self-pay | Admitting: Family

## 2012-04-01 DIAGNOSIS — C50519 Malignant neoplasm of lower-outer quadrant of unspecified female breast: Secondary | ICD-10-CM

## 2012-04-02 ENCOUNTER — Telehealth: Payer: Self-pay | Admitting: *Deleted

## 2012-04-02 ENCOUNTER — Ambulatory Visit (HOSPITAL_BASED_OUTPATIENT_CLINIC_OR_DEPARTMENT_OTHER): Payer: No Typology Code available for payment source

## 2012-04-02 ENCOUNTER — Encounter: Payer: Self-pay | Admitting: Family

## 2012-04-02 ENCOUNTER — Ambulatory Visit (HOSPITAL_BASED_OUTPATIENT_CLINIC_OR_DEPARTMENT_OTHER): Payer: No Typology Code available for payment source | Admitting: Family

## 2012-04-02 ENCOUNTER — Other Ambulatory Visit (HOSPITAL_BASED_OUTPATIENT_CLINIC_OR_DEPARTMENT_OTHER): Payer: No Typology Code available for payment source | Admitting: Lab

## 2012-04-02 VITALS — BP 116/82 | HR 76 | Temp 98.5°F | Resp 20 | Ht 62.0 in | Wt 194.7 lb

## 2012-04-02 DIAGNOSIS — B370 Candidal stomatitis: Secondary | ICD-10-CM

## 2012-04-02 DIAGNOSIS — C773 Secondary and unspecified malignant neoplasm of axilla and upper limb lymph nodes: Secondary | ICD-10-CM

## 2012-04-02 DIAGNOSIS — Z170 Estrogen receptor positive status [ER+]: Secondary | ICD-10-CM

## 2012-04-02 DIAGNOSIS — C50519 Malignant neoplasm of lower-outer quadrant of unspecified female breast: Secondary | ICD-10-CM

## 2012-04-02 DIAGNOSIS — Z5112 Encounter for antineoplastic immunotherapy: Secondary | ICD-10-CM

## 2012-04-02 DIAGNOSIS — C50919 Malignant neoplasm of unspecified site of unspecified female breast: Secondary | ICD-10-CM

## 2012-04-02 LAB — CBC WITH DIFFERENTIAL/PLATELET
Eosinophils Absolute: 0 10*3/uL (ref 0.0–0.5)
HCT: 43.8 % (ref 34.8–46.6)
LYMPH%: 14.5 % (ref 14.0–49.7)
MONO#: 2.8 10*3/uL — ABNORMAL HIGH (ref 0.1–0.9)
NEUT#: 10.9 10*3/uL — ABNORMAL HIGH (ref 1.5–6.5)
NEUT%: 67.8 % (ref 38.4–76.8)
Platelets: 293 10*3/uL (ref 145–400)
WBC: 16.1 10*3/uL — ABNORMAL HIGH (ref 3.9–10.3)

## 2012-04-02 LAB — COMPREHENSIVE METABOLIC PANEL (CC13)
ALT: 68 U/L — ABNORMAL HIGH (ref 0–55)
AST: 30 U/L (ref 5–34)
Albumin: 3.4 g/dL — ABNORMAL LOW (ref 3.5–5.0)
Alkaline Phosphatase: 143 U/L (ref 40–150)
BUN: 18 mg/dL (ref 7.0–26.0)
Potassium: 4.4 mEq/L (ref 3.5–5.1)
Sodium: 135 mEq/L — ABNORMAL LOW (ref 136–145)

## 2012-04-02 MED ORDER — SODIUM CHLORIDE 0.9 % IJ SOLN
10.0000 mL | INTRAMUSCULAR | Status: DC | PRN
  Administered 2012-04-02: 10 mL
  Filled 2012-04-02: qty 10

## 2012-04-02 MED ORDER — ALTEPLASE 2 MG IJ SOLR
2.0000 mg | Freq: Once | INTRAMUSCULAR | Status: DC | PRN
  Filled 2012-04-02: qty 2

## 2012-04-02 MED ORDER — SODIUM CHLORIDE 0.9 % IV SOLN
Freq: Once | INTRAVENOUS | Status: AC
  Administered 2012-04-02: 16:00:00 via INTRAVENOUS

## 2012-04-02 MED ORDER — HEPARIN SOD (PORK) LOCK FLUSH 100 UNIT/ML IV SOLN
500.0000 [IU] | Freq: Once | INTRAVENOUS | Status: AC | PRN
  Administered 2012-04-02: 500 [IU]
  Filled 2012-04-02: qty 5

## 2012-04-02 MED ORDER — ACETAMINOPHEN 325 MG PO TABS
650.0000 mg | ORAL_TABLET | Freq: Once | ORAL | Status: AC
  Administered 2012-04-02: 650 mg via ORAL

## 2012-04-02 MED ORDER — OXYCODONE-ACETAMINOPHEN 5-325 MG PO TABS: 1.0000 | ORAL_TABLET | ORAL | Status: AC | PRN

## 2012-04-02 MED ORDER — TRASTUZUMAB CHEMO INJECTION 440 MG
2.0000 mg/kg | Freq: Once | INTRAVENOUS | Status: AC
  Administered 2012-04-02: 189 mg via INTRAVENOUS
  Filled 2012-04-02: qty 9

## 2012-04-02 MED ORDER — DIPHENHYDRAMINE HCL 25 MG PO CAPS
50.0000 mg | ORAL_CAPSULE | Freq: Once | ORAL | Status: AC
  Administered 2012-04-02: 50 mg via ORAL

## 2012-04-02 NOTE — Progress Notes (Signed)
[facility]  Name: Holly Parrish                  DATE: 04/02/2012 MRN: [id number]                      DOB: 1971/06/21  REFERRING PHYSICIAN: No ref. provider found  DIAGNOSIS: Patient Active Problem List   Diagnosis Date Noted  . Cancer of lower-outer quadrant of female breast 02/27/2012     Encounter Diagnosis  Name Primary?  . Breast cancer Yes  Cancer of lower-outer quadrant of female breast   Primary site: Breast (Right)   Staging method: AJCC 7th Edition   Clinical: Stage IIB (T2, N1, cM0)   Summary: Stage IIB (T2, N1, cM0)  HPI: 41 y.o. female. Living in [place] referred for evaluation of locally advanced breast cancer. She noted a mass in her right breast about 5 months ago. She did not have any healthcare an insurance and so delayed getting this looked after. She ultimately underwent a mammogram on 02/16/2012. There showed a firm palpable mass right breast 8:00 position measuring 4 cm there was an additional 3 cm lower right axillary lymph node. Ultrasound confirmed the presence of this mass in the breast measuring 2.7 x 2.7 x 2.9 cm. There were 2 large right lower right axillary lymph nodes largest one measuring 4 cm a smaller one measuring 2 x 3.1 cm. She underwent biopsies of both these areas. 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 This is also the ER and PR positive.   CURRENT THERAPY: Taxotere/Carboplatin given every 3 weeks, with Herceptin weekly, began 03/26/12.   INTERIM HISTORY: Had difficulty with round one of chemo. Had thrush-type symptoms with taste changes. Also had dyspepsia, using Pepto Bismol with adequate relief. Had diffuse joint aches. Using percocet. Is very tearful today when recount events of the last week. No headache or blurred vision. No cough or shortness of breath. No abdominal pain or new bone pain. Bowel and bladder function are normal. Appetite is fair with fair fluid intake. Remainder of  the 10 point  review of systems is negative.  PHYSICAL EXAM: BP 116/82  Pulse 76  Temp 98.5 F (36.9 C) (Oral)  Resp 20  Ht 5\' 2"  (1.575 m)  Wt 194 lb 11.2 oz (88.315 kg)  BMI 35.61 kg/m2  LMP 02/25/2012 General: Well developed, well nourished, in no acute distress.  EENT: No ocular or oral lesions. No stomatitis. Tongue is coated with white exudate.  Respiratory: Lungs are clear to auscultation bilaterally with normal respiratory movement and no accessory muscle use. Cardiac: No murmur, rub or tachycardia. No upper or lower extremity edema.  GI: Abdomen is soft, no palpable hepatosplenomegaly. No fluid wave. No tenderness. Musculoskeletal: No kyphosis, no tenderness over the spine, ribs or hips. Lymph: No cervical, infraclavicular, axillary or inguinal adenopathy. Neuro: No focal neurological deficits. Psych: Alert and oriented X 3, appropriate mood and affect.    LABORATORY STUDIES:   Results for orders placed in visit on 04/02/12  CBC WITH DIFFERENTIAL      Component Value Range   WBC 16.1 (*) 3.9 - 10.3 10e3/uL   NEUT# 10.9 (*) 1.5 - 6.5 10e3/uL   HGB 15.2  11.6 - 15.9 g/dL   HCT 08.6  57.8 - 46.9 %   Platelets 293  145 - 400 10e3/uL   MCV 92.6  79.5 - 101.0 fL   MCH 32.1  25.1 -  34.0 pg   MCHC 34.7  31.5 - 36.0 g/dL   RBC 4.09  8.11 - 9.14 10e6/uL   RDW 12.5  11.2 - 14.5 %   lymph# 2.3  0.9 - 3.3 10e3/uL   MONO# 2.8 (*) 0.1 - 0.9 10e3/uL   Eosinophils Absolute 0.0  0.0 - 0.5 10e3/uL   Basophils Absolute 0.0  0.0 - 0.1 10e3/uL   NEUT% 67.8  38.4 - 76.8 %   LYMPH% 14.5  14.0 - 49.7 %   MONO% 17.4 (*) 0.0 - 14.0 %   EOS% 0.1  0.0 - 7.0 %   BASO% 0.2  0.0 - 2.0 %   nRBC 0  0 - 0 %    IMPRESSION:  41 y/o female with: 1.  Locally advanced right breast cancer, has received one cycle TCH with fair tolerance. Here for Herceptin alone 2.  Thrush  PLAN:   1. Herceptin only today.  2. Next TC 04/16/12.  3. Prescription for Magic Mouthwash to include Nystatin.  4.  Refill Percocet.

## 2012-04-02 NOTE — Progress Notes (Signed)
Treatment Plan Dated 04/02/2012 

## 2012-04-02 NOTE — Patient Instructions (Addendum)
[facility] Discharge Instructions for Patients Receiving Chemotherapy  Today you received the following chemotherapy agents Herceptin  To help prevent nausea and vomiting after your treatment, we encourage you to take your nausea medication Begin taking it at 7 pm and take it as often as prescribed for the next 24 to 72 hours.   If you develop nausea and vomiting that is not controlled by your nausea medication, call the clinic. If it is after clinic hours your family physician or the after hours number for the clinic or go to the Emergency Department.   BELOW ARE SYMPTOMS THAT SHOULD BE REPORTED IMMEDIATELY:  *FEVER GREATER THAN 100.5 F  *CHILLS WITH OR WITHOUT FEVER  NAUSEA AND VOMITING THAT IS NOT CONTROLLED WITH YOUR NAUSEA MEDICATION  *UNUSUAL SHORTNESS OF BREATH  *UNUSUAL BRUISING OR BLEEDING  TENDERNESS IN MOUTH AND THROAT WITH OR WITHOUT PRESENCE OF ULCERS  *URINARY PROBLEMS  *BOWEL PROBLEMS  UNUSUAL RASH Items with * indicate a potential emergency and should be followed up as soon as possible.  One of the nurses will contact you 24 hours after your treatment. Please let the nurse know about any problems that you may have experienced. Feel free to call the clinic you have any questions or concerns. The clinic phone number is [phone].   I have been informed and understand all the instructions given to me. I know to contact the clinic, my physician, or go to the Emergency Department if any problems should occur. I do not have any questions at this time, but understand that I may call the clinic during office hours   should I have any questions or need assistance in obtaining follow up care.    __________________________________________  _____________  __________ Signature of Patient or Authorized Representative            Date                   Time    __________________________________________ Nurse's Signature    

## 2012-04-02 NOTE — Telephone Encounter (Signed)
Cancel lab appt 9/13. Infusion only.

## 2012-04-09 ENCOUNTER — Other Ambulatory Visit: Payer: No Typology Code available for payment source | Admitting: Lab

## 2012-04-09 ENCOUNTER — Ambulatory Visit: Payer: Self-pay

## 2012-04-09 VITALS — BP 120/80 | HR 77 | Temp 99.1°F

## 2012-04-09 DIAGNOSIS — Z5112 Encounter for antineoplastic immunotherapy: Secondary | ICD-10-CM

## 2012-04-09 DIAGNOSIS — C50519 Malignant neoplasm of lower-outer quadrant of unspecified female breast: Secondary | ICD-10-CM

## 2012-04-09 MED ORDER — TRASTUZUMAB CHEMO INJECTION 440 MG
2.0000 mg/kg | Freq: Once | INTRAVENOUS | Status: AC
  Administered 2012-04-09: 189 mg via INTRAVENOUS
  Filled 2012-04-09: qty 9

## 2012-04-09 MED ORDER — DIPHENHYDRAMINE HCL 25 MG PO CAPS
50.0000 mg | ORAL_CAPSULE | Freq: Once | ORAL | Status: AC
  Administered 2012-04-09: 25 mg via ORAL

## 2012-04-09 MED ORDER — ACETAMINOPHEN 325 MG PO TABS
650.0000 mg | ORAL_TABLET | Freq: Once | ORAL | Status: AC
  Administered 2012-04-09: 650 mg via ORAL

## 2012-04-09 MED ORDER — SODIUM CHLORIDE 0.9 % IJ SOLN
10.0000 mL | INTRAMUSCULAR | Status: DC | PRN
  Administered 2012-04-09: 10 mL
  Filled 2012-04-09: qty 10

## 2012-04-09 MED ORDER — HEPARIN SOD (PORK) LOCK FLUSH 100 UNIT/ML IV SOLN
500.0000 [IU] | Freq: Once | INTRAVENOUS | Status: AC | PRN
  Administered 2012-04-09: 500 [IU]
  Filled 2012-04-09: qty 5

## 2012-04-09 MED ORDER — SODIUM CHLORIDE 0.9 % IV SOLN
Freq: Once | INTRAVENOUS | Status: AC
  Administered 2012-04-09: 14:00:00 via INTRAVENOUS

## 2012-04-09 NOTE — Patient Instructions (Signed)
[facility] Discharge Instructions for Patients Receiving Chemotherapy  Today you received the following chemotherapy agents Herceptin To help prevent nausea and vomiting after your treatment, we encourage you to take your nausea medication as prescribed.  If you develop nausea and vomiting that is not controlled by your nausea medication, call the clinic. If it is after clinic hours your family physician or the after hours number for the clinic or go to the Emergency Department.   BELOW ARE SYMPTOMS THAT SHOULD BE REPORTED IMMEDIATELY:  *FEVER GREATER THAN 100.5 F  *CHILLS WITH OR WITHOUT FEVER  NAUSEA AND VOMITING THAT IS NOT CONTROLLED WITH YOUR NAUSEA MEDICATION  *UNUSUAL SHORTNESS OF BREATH  *UNUSUAL BRUISING OR BLEEDING  TENDERNESS IN MOUTH AND THROAT WITH OR WITHOUT PRESENCE OF ULCERS  *URINARY PROBLEMS  *BOWEL PROBLEMS  UNUSUAL RASH Items with * indicate a potential emergency and should be followed up as soon as possible.  One of the nurses will contact you 24 hours after your treatment. Please let the nurse know about any problems that you may have experienced. Feel free to call the clinic you have any questions or concerns. The clinic phone number is [phone].   I have been informed and understand all the instructions given to me. I know to contact the clinic, my physician, or go to the Emergency Department if any problems should occur. I do not have any questions at this time, but understand that I may call the clinic during office hours   should I have any questions or need assistance in obtaining follow up care.    __________________________________________  _____________  __________ Signature of Patient or Authorized Representative            Date                   Time    __________________________________________ Nurse's Signature    

## 2012-04-12 ENCOUNTER — Encounter: Payer: Self-pay | Admitting: *Deleted

## 2012-04-12 ENCOUNTER — Other Ambulatory Visit: Payer: Self-pay | Admitting: Family

## 2012-04-12 ENCOUNTER — Telehealth: Payer: Self-pay | Admitting: *Deleted

## 2012-04-12 DIAGNOSIS — C50519 Malignant neoplasm of lower-outer quadrant of unspecified female breast: Secondary | ICD-10-CM

## 2012-04-12 NOTE — Telephone Encounter (Signed)
left voice message to inform the patient of the new date and time on 04-13-2012 starting at 12:45pm

## 2012-04-13 ENCOUNTER — Telehealth: Payer: Self-pay | Admitting: *Deleted

## 2012-04-13 ENCOUNTER — Ambulatory Visit: Payer: Self-pay | Admitting: Family

## 2012-04-13 ENCOUNTER — Other Ambulatory Visit: Payer: Self-pay | Admitting: Lab

## 2012-04-13 NOTE — Telephone Encounter (Signed)
Patient called in will not be able to make it on 04-13-2012 moved patient appointment to 04-14-2012 lab and md patient confirmed over the phone the new date and time

## 2012-04-14 ENCOUNTER — Ambulatory Visit (HOSPITAL_BASED_OUTPATIENT_CLINIC_OR_DEPARTMENT_OTHER): Payer: Medicare Other | Admitting: Oncology

## 2012-04-14 ENCOUNTER — Other Ambulatory Visit: Payer: Self-pay | Admitting: Lab

## 2012-04-14 VITALS — BP 117/75 | HR 80 | Temp 98.1°F | Resp 20 | Ht 62.0 in | Wt 200.2 lb

## 2012-04-14 DIAGNOSIS — C50519 Malignant neoplasm of lower-outer quadrant of unspecified female breast: Secondary | ICD-10-CM

## 2012-04-14 DIAGNOSIS — B370 Candidal stomatitis: Secondary | ICD-10-CM

## 2012-04-14 DIAGNOSIS — Z170 Estrogen receptor positive status [ER+]: Secondary | ICD-10-CM

## 2012-04-14 DIAGNOSIS — C773 Secondary and unspecified malignant neoplasm of axilla and upper limb lymph nodes: Secondary | ICD-10-CM

## 2012-04-14 LAB — COMPREHENSIVE METABOLIC PANEL (CC13)
ALT: 61 U/L — ABNORMAL HIGH (ref 0–55)
CO2: 21 mEq/L — ABNORMAL LOW (ref 22–29)
Calcium: 9.7 mg/dL (ref 8.4–10.4)
Chloride: 108 mEq/L — ABNORMAL HIGH (ref 98–107)
Creatinine: 0.9 mg/dL (ref 0.6–1.1)
Sodium: 140 mEq/L (ref 136–145)
Total Protein: 7.4 g/dL (ref 6.4–8.3)

## 2012-04-14 LAB — CBC WITH DIFFERENTIAL/PLATELET
BASO%: 0.2 % (ref 0.0–2.0)
Eosinophils Absolute: 0 10*3/uL (ref 0.0–0.5)
HCT: 38.4 % (ref 34.8–46.6)
MCHC: 32.9 g/dL (ref 31.5–36.0)
MONO#: 0 10*3/uL — ABNORMAL LOW (ref 0.1–0.9)
NEUT#: 7.8 10*3/uL — ABNORMAL HIGH (ref 1.5–6.5)
NEUT%: 90.7 % — ABNORMAL HIGH (ref 38.4–76.8)
WBC: 8.6 10*3/uL (ref 3.9–10.3)
lymph#: 0.7 10*3/uL — ABNORMAL LOW (ref 0.9–3.3)

## 2012-04-14 MED ORDER — FLUCONAZOLE 100 MG PO TABS: 100.0000 mg | ORAL_TABLET | Freq: Every day | ORAL | Status: DC

## 2012-04-14 NOTE — Progress Notes (Signed)
[facility]  Name: Holly Parrish                  DATE: 04/14/2012 MRN: [id number]                      DOB: 1970-09-07  REFERRING PHYSICIAN: No ref. provider found  DIAGNOSIS: Patient Active Problem List   Diagnosis Date Noted  . 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 female. Living in [place] referred for evaluation of locally advanced breast cancer. She noted a mass in her right breast about 5 months ago. She did not have any healthcare an insurance and so delayed getting this looked after. She ultimately underwent a mammogram on 02/16/2012. There showed a firm palpable mass right breast 8:00 position measuring 4 cm there was an additional 3 cm lower right axillary lymph node. Ultrasound confirmed the presence of this mass in the breast measuring 2.7 x 2.7 x 2.9 cm. There were 2 large right lower right axillary lymph nodes largest one measuring 4 cm a smaller one measuring 2 x 3.1 cm. She underwent biopsies of both these areas. 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 This is also the ER and PR positive.   CURRENT THERAPY: Taxotere/Carboplatin given every 3 weeks, with Herceptin weekly, began 03/26/12.   INTERIM HISTORY: Had difficulty with round one of chemo. Had thrush-type symptoms with taste changes. Also had dyspepsia, using Pepto Bismol with adequate relief. Had diffuse joint aches. Using percocet. Is very tearful today when recount events of the last week. No headache or blurred vision. No cough or shortness of breath. No abdominal pain or new bone pain. Bowel and bladder function are normal. Appetite is fair with fair fluid intake. Remainder of the 10 point  review of systems is  negative.  PHYSICAL EXAM: BP 117/75  Pulse 80  Temp 98.1 F (36.7 C) (Oral)  Resp 20  Ht 5\' 2"  (1.575 m)  Wt 200 lb 3.2 oz (90.81 kg)  BMI 36.62 kg/m2 General: Well developed, well nourished, in no acute distress.  EENT: No ocular or oral lesions. No stomatitis. Tongue is coated with white exudate.  Respiratory: Lungs are clear to auscultation bilaterally with normal respiratory movement and no accessory muscle use. Cardiac: No murmur, rub or tachycardia. No upper or lower extremity edema.  GI: Abdomen is soft, no palpable hepatosplenomegaly. No fluid wave. No tenderness. Musculoskeletal: No kyphosis, no tenderness over the spine, ribs or hips. Lymph: No cervical, infraclavicular, axillary or inguinal adenopathy. Neuro: No focal neurological deficits. Psych: Alert and oriented X 3, appropriate mood and affect.    LABORATORY STUDIES:   Results for orders placed in visit on 04/14/12  CBC WITH DIFFERENTIAL      Component Value Range   WBC 8.6  3.9 - 10.3 10e3/uL   NEUT# 7.8 (*) 1.5 - 6.5 10e3/uL   HGB 12.6  11.6 - 15.9 g/dL   HCT 40.9  81.1 - 91.4 %   Platelets 235  145 - 400 10e3/uL   MCV 98.3  79.5 - 101.0 fL   MCH 32.3  25.1 - 34.0 pg   MCHC 32.9  31.5 - 36.0 g/dL  RBC 3.91  3.70 - 5.45 10e6/uL   RDW 13.0  11.2 - 14.5 %   lymph# 0.7 (*) 0.9 - 3.3 10e3/uL   MONO# 0.0 (*) 0.1 - 0.9 10e3/uL   Eosinophils Absolute 0.0  0.0 - 0.5 10e3/uL   Basophils Absolute 0.0  0.0 - 0.1 10e3/uL   NEUT% 90.7 (*) 38.4 - 76.8 %   LYMPH% 8.5 (*) 14.0 - 49.7 %   MONO% 0.6  0.0 - 14.0 %   EOS% 0.0  0.0 - 7.0 %   BASO% 0.2  0.0 - 2.0 %  COMPREHENSIVE METABOLIC PANEL (CC13)      Component Value Range   Sodium 140  136 - 145 mEq/L   Potassium 4.1  3.5 - 5.1 mEq/L   Chloride 108 (*) 98 - 107 mEq/L   CO2 21 (*) 22 - 29 mEq/L   Glucose 149 (*) 70 - 99 mg/dl   BUN 16.1  7.0 - 09.6 mg/dL   Creatinine 0.9  0.6 - 1.1 mg/dL   Total Bilirubin 0.45  0.20 - 1.20 mg/dL   Alkaline Phosphatase 118  40  - 150 U/L   AST 40 (*) 5 - 34 U/L   ALT 61 (*) 0 - 55 U/L   Total Protein 7.4  6.4 - 8.3 g/dL   Albumin 3.6  3.5 - 5.0 g/dL   Calcium 9.7  8.4 - 40.9 mg/dL    IMPRESSION:  41 y/o female with: 1.  Locally advanced right breast cancer, has received one cycle TCH with fair tolerance. Here for Herceptin alone 2.  Thrush  PLAN:   1. Herceptin only today.  2. Next TC 04/16/12.  3. Prescription for Magic Mouthwash to include Nystatin.  4. Refill Percocet.

## 2012-04-15 ENCOUNTER — Other Ambulatory Visit: Payer: Self-pay | Admitting: Oncology

## 2012-04-15 NOTE — Telephone Encounter (Signed)
Gave patient appointment for 04-21-2012 starting at 1:00pm

## 2012-04-16 ENCOUNTER — Other Ambulatory Visit: Payer: Self-pay | Admitting: Oncology

## 2012-04-16 ENCOUNTER — Ambulatory Visit: Payer: No Typology Code available for payment source | Admitting: Family

## 2012-04-16 ENCOUNTER — Ambulatory Visit: Payer: Medicare Other

## 2012-04-16 ENCOUNTER — Other Ambulatory Visit: Payer: No Typology Code available for payment source | Admitting: Lab

## 2012-04-16 ENCOUNTER — Encounter: Payer: Self-pay | Admitting: Oncology

## 2012-04-16 VITALS — BP 123/75 | HR 69 | Temp 97.7°F | Resp 16

## 2012-04-16 DIAGNOSIS — C50519 Malignant neoplasm of lower-outer quadrant of unspecified female breast: Secondary | ICD-10-CM

## 2012-04-16 DIAGNOSIS — Z5111 Encounter for antineoplastic chemotherapy: Secondary | ICD-10-CM

## 2012-04-16 DIAGNOSIS — Z5112 Encounter for antineoplastic immunotherapy: Secondary | ICD-10-CM

## 2012-04-16 MED ORDER — DOCETAXEL CHEMO INJECTION 160 MG/16ML
75.0000 mg/m2 | Freq: Once | INTRAVENOUS | Status: AC
  Administered 2012-04-16: 150 mg via INTRAVENOUS
  Filled 2012-04-16: qty 15

## 2012-04-16 MED ORDER — ACETAMINOPHEN 325 MG PO TABS
650.0000 mg | ORAL_TABLET | Freq: Once | ORAL | Status: AC
  Administered 2012-04-16: 650 mg via ORAL

## 2012-04-16 MED ORDER — HEPARIN SOD (PORK) LOCK FLUSH 100 UNIT/ML IV SOLN
500.0000 [IU] | Freq: Once | INTRAVENOUS | Status: AC | PRN
  Administered 2012-04-16: 500 [IU]
  Filled 2012-04-16: qty 5

## 2012-04-16 MED ORDER — DEXAMETHASONE SODIUM PHOSPHATE 4 MG/ML IJ SOLN
20.0000 mg | Freq: Once | INTRAMUSCULAR | Status: AC
  Administered 2012-04-16: 20 mg via INTRAVENOUS

## 2012-04-16 MED ORDER — SODIUM CHLORIDE 0.9 % IJ SOLN
10.0000 mL | INTRAMUSCULAR | Status: DC | PRN
  Administered 2012-04-16: 10 mL
  Filled 2012-04-16: qty 10

## 2012-04-16 MED ORDER — SODIUM CHLORIDE 0.9 % IV SOLN
706.0000 mg | Freq: Once | INTRAVENOUS | Status: AC
  Administered 2012-04-16: 710 mg via INTRAVENOUS
  Filled 2012-04-16: qty 71

## 2012-04-16 MED ORDER — SODIUM CHLORIDE 0.9 % IV SOLN
Freq: Once | INTRAVENOUS | Status: AC
  Administered 2012-04-16: 12:00:00 via INTRAVENOUS

## 2012-04-16 MED ORDER — TRASTUZUMAB CHEMO INJECTION 440 MG
2.0000 mg/kg | Freq: Once | INTRAVENOUS | Status: AC
  Administered 2012-04-16: 189 mg via INTRAVENOUS
  Filled 2012-04-16: qty 9

## 2012-04-16 MED ORDER — SODIUM CHLORIDE 0.9 % IV SOLN: Freq: Once | INTRAVENOUS | Status: DC

## 2012-04-16 MED ORDER — DIPHENHYDRAMINE HCL 25 MG PO CAPS
50.0000 mg | ORAL_CAPSULE | Freq: Once | ORAL | Status: AC
  Administered 2012-04-16: 25 mg via ORAL

## 2012-04-16 MED ORDER — ONDANSETRON 16 MG/50ML IVPB (CHCC)
16.0000 mg | Freq: Once | INTRAVENOUS | Status: AC
  Administered 2012-04-16: 16 mg via INTRAVENOUS

## 2012-04-16 NOTE — Progress Notes (Signed)
Called patient to offer herceptin drug replacement; she states that she is approved for Medicaid.

## 2012-04-16 NOTE — Patient Instructions (Addendum)
[facility] Discharge Instructions for Patients Receiving Chemotherapy  Today you received the following chemotherapy agents Herceptin, Taxotere and Cytoxan  To help prevent nausea and vomiting after your treatment, we encourage you to take your nausea medication as prescribed.   If you develop nausea and vomiting that is not controlled by your nausea medication, call the clinic. If it is after clinic hours your family physician or the after hours number for the clinic or go to the Emergency Department.   BELOW ARE SYMPTOMS THAT SHOULD BE REPORTED IMMEDIATELY:  *FEVER GREATER THAN 100.5 F  *CHILLS WITH OR WITHOUT FEVER  NAUSEA AND VOMITING THAT IS NOT CONTROLLED WITH YOUR NAUSEA MEDICATION  *UNUSUAL SHORTNESS OF BREATH  *UNUSUAL BRUISING OR BLEEDING  TENDERNESS IN MOUTH AND THROAT WITH OR WITHOUT PRESENCE OF ULCERS  *URINARY PROBLEMS  *BOWEL PROBLEMS  UNUSUAL RASH Items with * indicate a potential emergency and should be followed up as soon as possible.  One of the nurses will contact you 24 hours after your treatment. Please let the nurse know about any problems that you may have experienced. Feel free to call the clinic you have any questions or concerns. The clinic phone number is [phone].   I have been informed and understand all the instructions given to me. I know to contact the clinic, my physician, or go to the Emergency Department if any problems should occur. I do not have any questions at this time, but understand that I may call the clinic during office hours   should I have any questions or need assistance in obtaining follow up care.    __________________________________________  _____________  __________ Signature of Patient or Authorized Representative            Date                   Time    __________________________________________ Nurse's Signature

## 2012-04-17 ENCOUNTER — Ambulatory Visit (HOSPITAL_BASED_OUTPATIENT_CLINIC_OR_DEPARTMENT_OTHER): Payer: Medicare Other

## 2012-04-17 VITALS — BP 124/78 | HR 55 | Temp 98.5°F | Resp 18

## 2012-04-17 DIAGNOSIS — C50519 Malignant neoplasm of lower-outer quadrant of unspecified female breast: Secondary | ICD-10-CM

## 2012-04-17 MED ORDER — PEGFILGRASTIM INJECTION 6 MG/0.6ML
6.0000 mg | Freq: Once | SUBCUTANEOUS | Status: AC
  Administered 2012-04-17: 6 mg via SUBCUTANEOUS

## 2012-04-19 ENCOUNTER — Other Ambulatory Visit: Payer: Self-pay | Admitting: Certified Registered Nurse Anesthetist

## 2012-04-21 ENCOUNTER — Ambulatory Visit: Payer: Self-pay | Admitting: Oncology

## 2012-04-21 ENCOUNTER — Other Ambulatory Visit: Payer: Self-pay | Admitting: Lab

## 2012-04-23 ENCOUNTER — Other Ambulatory Visit: Payer: Self-pay | Admitting: Lab

## 2012-04-23 ENCOUNTER — Ambulatory Visit: Payer: Self-pay

## 2012-04-23 ENCOUNTER — Other Ambulatory Visit: Payer: Self-pay | Admitting: *Deleted

## 2012-04-23 DIAGNOSIS — C50519 Malignant neoplasm of lower-outer quadrant of unspecified female breast: Secondary | ICD-10-CM

## 2012-04-23 DIAGNOSIS — Z5112 Encounter for antineoplastic immunotherapy: Secondary | ICD-10-CM

## 2012-04-23 DIAGNOSIS — C773 Secondary and unspecified malignant neoplasm of axilla and upper limb lymph nodes: Secondary | ICD-10-CM

## 2012-04-23 LAB — COMPREHENSIVE METABOLIC PANEL (CC13)
ALT: 39 U/L (ref 0–55)
Albumin: 3.3 g/dL — ABNORMAL LOW (ref 3.5–5.0)
BUN: 12 mg/dL (ref 7.0–26.0)
CO2: 27 mEq/L (ref 22–29)
Calcium: 9.3 mg/dL (ref 8.4–10.4)
Chloride: 101 mEq/L (ref 98–107)
Creatinine: 0.9 mg/dL (ref 0.6–1.1)

## 2012-04-23 LAB — CBC WITH DIFFERENTIAL/PLATELET
Eosinophils Absolute: 0 10*3/uL (ref 0.0–0.5)
HCT: 40.4 % (ref 34.8–46.6)
LYMPH%: 30.5 % (ref 14.0–49.7)
MONO#: 2.8 10*3/uL — ABNORMAL HIGH (ref 0.1–0.9)
NEUT#: 7.4 10*3/uL — ABNORMAL HIGH (ref 1.5–6.5)
NEUT%: 50.3 % (ref 38.4–76.8)
Platelets: 193 10*3/uL (ref 145–400)
WBC: 14.7 10*3/uL — ABNORMAL HIGH (ref 3.9–10.3)
nRBC: 0 % (ref 0–0)

## 2012-04-23 MED ORDER — SODIUM CHLORIDE 0.9 % IV SOLN
Freq: Once | INTRAVENOUS | Status: AC
  Administered 2012-04-23: 12:00:00 via INTRAVENOUS

## 2012-04-23 MED ORDER — SODIUM CHLORIDE 0.9 % IJ SOLN
10.0000 mL | INTRAMUSCULAR | Status: DC | PRN
  Administered 2012-04-23: 10 mL
  Filled 2012-04-23: qty 10

## 2012-04-23 MED ORDER — ACETAMINOPHEN 325 MG PO TABS: 650.0000 mg | ORAL_TABLET | Freq: Once | ORAL | Status: DC

## 2012-04-23 MED ORDER — TRASTUZUMAB CHEMO INJECTION 440 MG
2.0000 mg/kg | Freq: Once | INTRAVENOUS | Status: AC
  Administered 2012-04-23: 189 mg via INTRAVENOUS
  Filled 2012-04-23: qty 9

## 2012-04-23 MED ORDER — HEPARIN SOD (PORK) LOCK FLUSH 100 UNIT/ML IV SOLN
500.0000 [IU] | Freq: Once | INTRAVENOUS | Status: AC | PRN
  Administered 2012-04-23: 500 [IU]
  Filled 2012-04-23: qty 5

## 2012-04-23 MED ORDER — DIPHENHYDRAMINE HCL 25 MG PO CAPS
50.0000 mg | ORAL_CAPSULE | Freq: Once | ORAL | Status: AC
  Administered 2012-04-23: 25 mg via ORAL

## 2012-04-23 NOTE — Patient Instructions (Signed)
Patient aware of next appointment; discharged home with no complaints. 

## 2012-04-28 ENCOUNTER — Telehealth: Payer: Self-pay | Admitting: Oncology

## 2012-04-28 NOTE — Telephone Encounter (Signed)
On call: returned call to patient [phone]) re feet numb, first toe numb, joint aches, thigh aches, "charlie horse" since last treatment. Patient states she has not heard back from this office since she called "last Friday" and symptoms are getting worse. Most recent chemo was cycle 2 carbo/taxotere/herceptin on 9-20 with neulasta 9-21 and herceptin 9-27. She states that she has lupus, is off usual ASA that she uses for joint pain similar to present joint symptoms. She does not have rheumatologist presently. She has had water and Sprite today, states drank gatorade over weekend without improvement in muscle cramp symptoms, also has tried hot bath without improvement. Intolerant to NSAIDS. Patient upset, did not want to discuss further, declined offer of evaluation at ED, stated she would call Dr Donnie Coffin tomorrow. I was not able to ask if she has been using compazine, as I wondered if this might be dystonic reaction. Patient hung up on this MD. I called back to same #, got VM, asked her to call back thru answering service if she would like me to talk about the meds with her now.  Ila Mcgill, MD

## 2012-04-28 NOTE — Telephone Encounter (Signed)
On Call:  Message from answering service "can't feel feet, really numb hips down". Returned call to # listed

## 2012-04-29 ENCOUNTER — Telehealth: Payer: Self-pay | Admitting: *Deleted

## 2012-04-29 ENCOUNTER — Other Ambulatory Visit: Payer: Self-pay | Admitting: *Deleted

## 2012-04-29 DIAGNOSIS — C50519 Malignant neoplasm of lower-outer quadrant of unspecified female breast: Secondary | ICD-10-CM

## 2012-04-29 NOTE — Telephone Encounter (Signed)
Per "on call" doctor request, have attempted to call pt for sx support. Have left VM for a return call

## 2012-04-30 ENCOUNTER — Other Ambulatory Visit: Payer: Self-pay | Admitting: *Deleted

## 2012-04-30 ENCOUNTER — Ambulatory Visit: Payer: Self-pay

## 2012-04-30 ENCOUNTER — Other Ambulatory Visit: Payer: Self-pay | Admitting: Lab

## 2012-04-30 VITALS — BP 103/73 | HR 67 | Temp 97.6°F | Resp 18

## 2012-04-30 DIAGNOSIS — C50519 Malignant neoplasm of lower-outer quadrant of unspecified female breast: Secondary | ICD-10-CM

## 2012-04-30 DIAGNOSIS — Z5112 Encounter for antineoplastic immunotherapy: Secondary | ICD-10-CM

## 2012-04-30 LAB — CBC WITH DIFFERENTIAL/PLATELET
BASO%: 0.8 % (ref 0.0–2.0)
EOS%: 0 % (ref 0.0–7.0)
HCT: 37.6 % (ref 34.8–46.6)
LYMPH%: 36.7 % (ref 14.0–49.7)
MCH: 32.4 pg (ref 25.1–34.0)
MCHC: 33.2 g/dL (ref 31.5–36.0)
NEUT%: 54.6 % (ref 38.4–76.8)
lymph#: 2.8 10*3/uL (ref 0.9–3.3)

## 2012-04-30 LAB — COMPREHENSIVE METABOLIC PANEL (CC13)
AST: 39 U/L — ABNORMAL HIGH (ref 5–34)
Alkaline Phosphatase: 114 U/L (ref 40–150)
BUN: 13 mg/dL (ref 7.0–26.0)
Creatinine: 0.8 mg/dL (ref 0.6–1.1)
Potassium: 4.2 mEq/L (ref 3.5–5.1)
Total Bilirubin: 0.3 mg/dL (ref 0.20–1.20)

## 2012-04-30 MED ORDER — HEPARIN SOD (PORK) LOCK FLUSH 100 UNIT/ML IV SOLN
500.0000 [IU] | Freq: Once | INTRAVENOUS | Status: AC | PRN
  Administered 2012-04-30: 500 [IU]
  Filled 2012-04-30: qty 5

## 2012-04-30 MED ORDER — SODIUM CHLORIDE 0.9 % IV SOLN
Freq: Once | INTRAVENOUS | Status: AC
  Administered 2012-04-30: 11:00:00 via INTRAVENOUS

## 2012-04-30 MED ORDER — SODIUM CHLORIDE 0.9 % IV SOLN
2.0000 mg/kg | Freq: Once | INTRAVENOUS | Status: AC
  Administered 2012-04-30: 189 mg via INTRAVENOUS
  Filled 2012-04-30: qty 9

## 2012-04-30 MED ORDER — ACETAMINOPHEN 325 MG PO TABS
650.0000 mg | ORAL_TABLET | Freq: Once | ORAL | Status: AC
  Administered 2012-04-30: 650 mg via ORAL

## 2012-04-30 MED ORDER — SODIUM CHLORIDE 0.9 % IJ SOLN
10.0000 mL | INTRAMUSCULAR | Status: DC | PRN
  Administered 2012-04-30: 10 mL
  Filled 2012-04-30: qty 10

## 2012-04-30 MED ORDER — DIPHENHYDRAMINE HCL 25 MG PO CAPS
50.0000 mg | ORAL_CAPSULE | Freq: Once | ORAL | Status: AC
  Administered 2012-04-30: 25 mg via ORAL

## 2012-04-30 MED ORDER — OXYCODONE-ACETAMINOPHEN 5-325 MG PO TABS: 1.0000 | ORAL_TABLET | Freq: Four times a day (QID) | ORAL | Status: DC | PRN

## 2012-04-30 NOTE — Telephone Encounter (Signed)
Amy Allyson Sabal, PA-C dispensed 30 oxycodone APAP 5/325mg .  Patient will need a new RX 05/14/12

## 2012-04-30 NOTE — Patient Instructions (Addendum)

## 2012-04-30 NOTE — Progress Notes (Signed)
Pt in for Herceptin only today. Reports increasing numbness in bilateral feet. She reports difficulty walking. R hand persistent numbness and tingling, intermittent in L hand. Constipation relieved with laxatives. Pt noticed "excessive drooling" about a month ago, this persists.

## 2012-05-06 ENCOUNTER — Other Ambulatory Visit: Payer: Self-pay | Admitting: *Deleted

## 2012-05-06 DIAGNOSIS — C50519 Malignant neoplasm of lower-outer quadrant of unspecified female breast: Secondary | ICD-10-CM

## 2012-05-07 ENCOUNTER — Ambulatory Visit (HOSPITAL_COMMUNITY)
Admission: RE | Admit: 2012-05-07 | Discharge: 2012-05-07 | Disposition: A | Discharge status: Home / Self Care | Payer: Medicare Other | Source: Ambulatory Visit | Attending: Oncology | Admitting: Oncology

## 2012-05-07 ENCOUNTER — Other Ambulatory Visit: Payer: Self-pay | Admitting: *Deleted

## 2012-05-07 ENCOUNTER — Ambulatory Visit: Payer: Medicare Other

## 2012-05-07 ENCOUNTER — Other Ambulatory Visit: Payer: Self-pay | Admitting: Lab

## 2012-05-07 ENCOUNTER — Ambulatory Visit (HOSPITAL_BASED_OUTPATIENT_CLINIC_OR_DEPARTMENT_OTHER): Payer: Self-pay | Admitting: Oncology

## 2012-05-07 ENCOUNTER — Telehealth: Payer: Self-pay | Admitting: *Deleted

## 2012-05-07 VITALS — BP 107/74 | HR 68 | Temp 98.1°F | Resp 20 | Ht 62.0 in | Wt 199.5 lb

## 2012-05-07 DIAGNOSIS — C50519 Malignant neoplasm of lower-outer quadrant of unspecified female breast: Secondary | ICD-10-CM

## 2012-05-07 DIAGNOSIS — C50919 Malignant neoplasm of unspecified site of unspecified female breast: Secondary | ICD-10-CM | POA: Insufficient documentation

## 2012-05-07 DIAGNOSIS — Z5112 Encounter for antineoplastic immunotherapy: Secondary | ICD-10-CM

## 2012-05-07 DIAGNOSIS — C773 Secondary and unspecified malignant neoplasm of axilla and upper limb lymph nodes: Secondary | ICD-10-CM

## 2012-05-07 DIAGNOSIS — M329 Systemic lupus erythematosus, unspecified: Secondary | ICD-10-CM | POA: Insufficient documentation

## 2012-05-07 DIAGNOSIS — M7989 Other specified soft tissue disorders: Secondary | ICD-10-CM | POA: Insufficient documentation

## 2012-05-07 DIAGNOSIS — M79609 Pain in unspecified limb: Secondary | ICD-10-CM | POA: Insufficient documentation

## 2012-05-07 DIAGNOSIS — Z5111 Encounter for antineoplastic chemotherapy: Secondary | ICD-10-CM

## 2012-05-07 DIAGNOSIS — F411 Generalized anxiety disorder: Secondary | ICD-10-CM

## 2012-05-07 DIAGNOSIS — Z79899 Other long term (current) drug therapy: Secondary | ICD-10-CM | POA: Insufficient documentation

## 2012-05-07 LAB — COMPREHENSIVE METABOLIC PANEL
ALT: 23 U/L (ref 0–35)
AST: 18 U/L (ref 0–37)
Chloride: 108 mEq/L (ref 96–112)
Creatinine, Ser: 0.79 mg/dL (ref 0.50–1.10)
Potassium: 4.3 mEq/L (ref 3.5–5.3)
Total Bilirubin: 0.4 mg/dL (ref 0.3–1.2)
Total Protein: 6.2 g/dL (ref 6.0–8.3)

## 2012-05-07 LAB — CBC WITH DIFFERENTIAL/PLATELET
BASO%: 0.1 % (ref 0.0–2.0)
LYMPH%: 17.2 % (ref 14.0–49.7)
MCHC: 33.7 g/dL (ref 31.5–36.0)
MONO#: 0.3 10*3/uL (ref 0.1–0.9)
MONO%: 3.4 % (ref 0.0–14.0)
Platelets: 300 10*3/uL (ref 145–400)
RBC: 3.6 10*6/uL — ABNORMAL LOW (ref 3.70–5.45)
WBC: 9.9 10*3/uL (ref 3.9–10.3)
nRBC: 0 % (ref 0–0)

## 2012-05-07 MED ORDER — FUROSEMIDE 20 MG PO TABS: 20.0000 mg | ORAL_TABLET | Freq: Two times a day (BID) | ORAL | Status: DC

## 2012-05-07 MED ORDER — HEPARIN SOD (PORK) LOCK FLUSH 100 UNIT/ML IV SOLN
500.0000 [IU] | Freq: Once | INTRAVENOUS | Status: AC | PRN
  Administered 2012-05-07: 500 [IU]
  Filled 2012-05-07: qty 5

## 2012-05-07 MED ORDER — SODIUM CHLORIDE 0.9 % IV SOLN
Freq: Once | INTRAVENOUS | Status: AC
  Administered 2012-05-07: 12:00:00 via INTRAVENOUS

## 2012-05-07 MED ORDER — ACETAMINOPHEN 325 MG PO TABS
650.0000 mg | ORAL_TABLET | Freq: Once | ORAL | Status: AC
  Administered 2012-05-07: 650 mg via ORAL

## 2012-05-07 MED ORDER — OXYCODONE-ACETAMINOPHEN 5-325 MG PO TABS: 1.0000 | ORAL_TABLET | Freq: Four times a day (QID) | ORAL | Status: DC | PRN

## 2012-05-07 MED ORDER — TRASTUZUMAB CHEMO INJECTION 440 MG
2.0000 mg/kg | Freq: Once | INTRAVENOUS | Status: AC
  Administered 2012-05-07: 189 mg via INTRAVENOUS
  Filled 2012-05-07: qty 9

## 2012-05-07 MED ORDER — SODIUM CHLORIDE 0.9 % IV SOLN
706.0000 mg | Freq: Once | INTRAVENOUS | Status: AC
  Administered 2012-05-07: 710 mg via INTRAVENOUS
  Filled 2012-05-07: qty 71

## 2012-05-07 MED ORDER — POTASSIUM CHLORIDE ER 10 MEQ PO TBCR: 20.0000 meq | EXTENDED_RELEASE_TABLET | Freq: Once | ORAL | Status: DC

## 2012-05-07 MED ORDER — DIPHENHYDRAMINE HCL 25 MG PO CAPS
50.0000 mg | ORAL_CAPSULE | Freq: Once | ORAL | Status: AC
  Administered 2012-05-07: 50 mg via ORAL

## 2012-05-07 MED ORDER — ONDANSETRON 16 MG/50ML IVPB (CHCC)
16.0000 mg | Freq: Once | INTRAVENOUS | Status: AC
  Administered 2012-05-07: 16 mg via INTRAVENOUS

## 2012-05-07 MED ORDER — DEXAMETHASONE SODIUM PHOSPHATE 4 MG/ML IJ SOLN
20.0000 mg | Freq: Once | INTRAMUSCULAR | Status: AC
  Administered 2012-05-07: 20 mg via INTRAVENOUS

## 2012-05-07 MED ORDER — SODIUM CHLORIDE 0.9 % IJ SOLN
10.0000 mL | INTRAMUSCULAR | Status: DC | PRN
  Administered 2012-05-07: 10 mL
  Filled 2012-05-07: qty 10

## 2012-05-07 MED ORDER — DOCETAXEL CHEMO INJECTION 160 MG/16ML
75.0000 mg/m2 | Freq: Once | INTRAVENOUS | Status: AC
  Administered 2012-05-07: 150 mg via INTRAVENOUS
  Filled 2012-05-07: qty 15

## 2012-05-07 MED ORDER — OXYCODONE-ACETAMINOPHEN 5-325 MG PO TABS: 1.0000 | ORAL_TABLET | ORAL | Status: DC | PRN

## 2012-05-07 NOTE — Patient Instructions (Signed)
[facility] Discharge Instructions for Patients Receiving Chemotherapy  Today you received the following chemotherapy agents Taxotere, Herceptin, Carboplatin  To help prevent nausea and vomiting after your treatment, we encourage you to take your nausea medication as directed by MD. If you develop nausea and vomiting that is not controlled by your nausea medication, call the clinic. If it is after clinic hours your family physician or the after hours number for the clinic or go to the Emergency Department.   BELOW ARE SYMPTOMS THAT SHOULD BE REPORTED IMMEDIATELY:  *FEVER GREATER THAN 100.5 F  *CHILLS WITH OR WITHOUT FEVER  NAUSEA AND VOMITING THAT IS NOT CONTROLLED WITH YOUR NAUSEA MEDICATION  *UNUSUAL SHORTNESS OF BREATH  *UNUSUAL BRUISING OR BLEEDING  TENDERNESS IN MOUTH AND THROAT WITH OR WITHOUT PRESENCE OF ULCERS  *URINARY PROBLEMS  *BOWEL PROBLEMS  UNUSUAL RASH Items with * indicate a potential emergency and should be followed up as soon as possible.   Feel free to call the clinic you have any questions or concerns. The clinic phone number is [phone].   I have been informed and understand all the instructions given to me. I know to contact the clinic, my physician, or go to the Emergency Department if any problems should occur. I do not have any questions at this time, but understand that I may call the clinic during office hours   should I have any questions or need assistance in obtaining follow up care.    __________________________________________  _____________  __________ Signature of Patient or Authorized Representative            Date                   Time    __________________________________________ Nurse's Signature

## 2012-05-07 NOTE — Progress Notes (Signed)
Hematology and Oncology Follow Up Visit  Holly Parrish [id number] 1971/04/13 41 y.o. 05/07/2012 10:54 AM   DIAGNOSIS:   HER-2 positive breast cancer on [facility] chemotherapy due for day 1 cycle 3  PAST THERAPY:  2 cycles of TCH chemotherapy  Interim History:  The patient returns for day 1 cycle 3 of TCH. Her last cycle has been a little more complicated. She has developed lower extremity edema and is having pain associated with this. She is been trying to work and as having difficulty because of fatigue. She has not had specific problems nausea vomiting numbness tingling or mouth sores. She is the spine and and is quite anxious and somewhat agitated as well.  Medications: I have reviewed the patient's current medications.  Allergies:  Allergies  Allergen Reactions  . Nsaids Anaphylaxis  . Penicillins Anaphylaxis  . Tetanus Toxoids Anaphylaxis  . Hydrogen Peroxide Other (See Comments)    "It burns until I bleed."    Past Medical History, Surgical history, Social history, and Family History were reviewed and updated.  Review of Systems: Constitutional:  Negative for fever, chills, night sweats, anorexia, weight loss, pain. Cardiovascular: negative Respiratory: no cough, shortness of breath, or wheezing negative Neurological: negative Dermatological: negative ENT: negative Skin Gastrointestinal: negative Genito-Urinary: negative Hematological and Lymphatic: negative Breast: negative Musculoskeletal: negative Remaining ROS negative.  Physical Exam:  Blood pressure 107/74, pulse 68, temperature 98.1 F (36.7 C), temperature source Oral, resp. rate 20, height 5\' 2"  (1.575 m), weight 199 lb 8 oz (90.493 kg).  ECOG: 0   HEENT:  Sclerae anicteric, conjunctivae pink.  Oropharynx clear.  No mucositis or candidiasis.  Nodes:  No cervical, supraclavicular, or axillary lymphadenopathy palpated.  Breast Exam:  Right breast is benign.  No masses, discharge, skin change, or nipple  inversion. The previous documented mass in the right breast is no longer detectable. He barely feel a abnormal lymph node in the right side.  Left breast is benign.  No masses, discharge, skin change, or nipple inversion..  Lungs:  Clear to auscultation bilaterally.  No crackles, rhonchi, or wheezes.  Heart:  Regular rate and rhythm.  Abdomen:  Soft, nontender.  Positive bowel sounds.  No organomegaly or masses palpated.  Musculoskeletal:  No focal spinal tenderness to palpation.  Extremities:  She has lower extremity edema. There is tenderness in the calf the right versus left.  No peripheral edema or cyanosis.  Skin:  Benign.  Neuro:  Nonfocal.    Lab Results: Lab Results  Component Value Date   WBC 9.9 05/07/2012   HGB 11.7 05/07/2012   HCT 34.7* 05/07/2012   MCV 96.4 05/07/2012   PLT 300 05/07/2012     Chemistry      Component Value Date/Time   NA 139 04/30/2012 1026   NA 139 03/03/2012 1205   K 4.2 04/30/2012 1026   K 3.8 03/03/2012 1205   CL 110* 04/30/2012 1026   CL 102 03/03/2012 1205   CO2 21* 04/30/2012 1026   CO2 29 03/03/2012 1205   BUN 13.0 04/30/2012 1026   BUN 15 03/03/2012 1205   CREATININE 0.8 04/30/2012 1026   CREATININE 0.87 03/03/2012 1205      Component Value Date/Time   CALCIUM 8.5 04/30/2012 1026   CALCIUM 9.3 03/03/2012 1205   ALKPHOS 114 04/30/2012 1026   ALKPHOS 86 03/03/2012 1205   AST 39* 04/30/2012 1026   AST 17 03/03/2012 1205   ALT 47 04/30/2012 1026   ALT 11 03/03/2012 1205  BILITOT 0.30 04/30/2012 1026   BILITOT 0.2* 03/03/2012 1205       Radiological Studies:  No results found.   IMPRESSIONS AND PLAN: A 41 y.o. female with   History of locally advanced HER-2 positive, due for day 1 cycle 3 TCH chemotherapy. She is having a bit of a tough time. We'll get one of our social workers to speak her today. I will refill her pain medication. I think she would benefit from Lasix and concurrent potassium. I will go ahead and get a Doppler ultrasound of her leg on the right  to rule out a DVT. Her counts are adequate for chemotherapy today and I will see her in a week's time for day 7 cycle 3. Fortunately she is on a good clinical response.  Spent more than half the time coordinating care, as well as discussion of BMI and its implications.      Rockford Leinen 10/11/201310:54 AM Cell [phone]

## 2012-05-07 NOTE — Progress Notes (Signed)
VASCULAR LAB PRELIMINARY  PRELIMINARY  PRELIMINARY  PRELIMINARY  Right upper extremity venous duplex completed.    Preliminary report:  Right lower extremity venous duplex  Holly Parrish, RVS 05/07/2012, 3:29 PM

## 2012-05-07 NOTE — Telephone Encounter (Signed)
05-14-2012 starting at 1:00pm

## 2012-05-08 ENCOUNTER — Ambulatory Visit (HOSPITAL_BASED_OUTPATIENT_CLINIC_OR_DEPARTMENT_OTHER): Payer: Medicare Other

## 2012-05-08 ENCOUNTER — Telehealth: Payer: Self-pay

## 2012-05-08 VITALS — BP 122/81 | HR 71 | Temp 98.3°F | Resp 18

## 2012-05-08 DIAGNOSIS — C50519 Malignant neoplasm of lower-outer quadrant of unspecified female breast: Secondary | ICD-10-CM

## 2012-05-08 MED ORDER — PEGFILGRASTIM INJECTION 6 MG/0.6ML
6.0000 mg | Freq: Once | SUBCUTANEOUS | Status: AC
  Administered 2012-05-08: 6 mg via SUBCUTANEOUS

## 2012-05-08 NOTE — Telephone Encounter (Signed)
Called pt at home /cell number, and both emergency contacts listed, as pt has not shown for injection yet.  Messages left on her cell # listed, and [employer] # (both unidentified voicemails) for pt to contact the office.

## 2012-05-09 MED ORDER — VENLAFAXINE HCL 37.5 MG PO TABS: 37.5000 mg | ORAL_TABLET | Freq: Two times a day (BID) | ORAL | Status: DC

## 2012-05-14 ENCOUNTER — Other Ambulatory Visit: Payer: Self-pay | Admitting: Oncology

## 2012-05-14 ENCOUNTER — Ambulatory Visit (HOSPITAL_BASED_OUTPATIENT_CLINIC_OR_DEPARTMENT_OTHER): Payer: Medicare Other | Admitting: Oncology

## 2012-05-14 ENCOUNTER — Ambulatory Visit (HOSPITAL_BASED_OUTPATIENT_CLINIC_OR_DEPARTMENT_OTHER): Payer: Medicare Other

## 2012-05-14 ENCOUNTER — Encounter: Payer: Self-pay | Admitting: Oncology

## 2012-05-14 ENCOUNTER — Other Ambulatory Visit (HOSPITAL_BASED_OUTPATIENT_CLINIC_OR_DEPARTMENT_OTHER): Payer: Medicare Other | Admitting: Lab

## 2012-05-14 VITALS — BP 128/93 | HR 101 | Temp 98.6°F | Resp 20 | Ht 62.0 in | Wt 196.0 lb

## 2012-05-14 DIAGNOSIS — C50519 Malignant neoplasm of lower-outer quadrant of unspecified female breast: Secondary | ICD-10-CM

## 2012-05-14 DIAGNOSIS — R52 Pain, unspecified: Secondary | ICD-10-CM

## 2012-05-14 DIAGNOSIS — Z170 Estrogen receptor positive status [ER+]: Secondary | ICD-10-CM

## 2012-05-14 DIAGNOSIS — R609 Edema, unspecified: Secondary | ICD-10-CM

## 2012-05-14 DIAGNOSIS — Z5112 Encounter for antineoplastic immunotherapy: Secondary | ICD-10-CM

## 2012-05-14 LAB — COMPREHENSIVE METABOLIC PANEL (CC13)
ALT: 35 U/L (ref 0–55)
AST: 30 U/L (ref 5–34)
Albumin: 3.5 g/dL (ref 3.5–5.0)
Alkaline Phosphatase: 109 U/L (ref 40–150)
BUN: 12 mg/dL (ref 7.0–26.0)
Calcium: 9.3 mg/dL (ref 8.4–10.4)
Chloride: 103 mEq/L (ref 98–107)
Potassium: 4.2 mEq/L (ref 3.5–5.1)
Sodium: 136 mEq/L (ref 136–145)
Total Protein: 6 g/dL — ABNORMAL LOW (ref 6.4–8.3)

## 2012-05-14 LAB — CBC WITH DIFFERENTIAL/PLATELET
BASO%: 0.2 % (ref 0.0–2.0)
HCT: 37.5 % (ref 34.8–46.6)
HGB: 12.1 g/dL (ref 11.6–15.9)
MCHC: 32.2 g/dL (ref 31.5–36.0)
MONO#: 5.1 10*3/uL — ABNORMAL HIGH (ref 0.1–0.9)
NEUT%: 57.7 % (ref 38.4–76.8)
RDW: 14.7 % — ABNORMAL HIGH (ref 11.2–14.5)
WBC: 27.7 10*3/uL — ABNORMAL HIGH (ref 3.9–10.3)
lymph#: 6.6 10*3/uL — ABNORMAL HIGH (ref 0.9–3.3)

## 2012-05-14 MED ORDER — SODIUM CHLORIDE 0.9 % IV SOLN
Freq: Once | INTRAVENOUS | Status: AC
  Administered 2012-05-14: 16:00:00 via INTRAVENOUS

## 2012-05-14 MED ORDER — ACETAMINOPHEN 325 MG PO TABS
650.0000 mg | ORAL_TABLET | Freq: Once | ORAL | Status: AC
  Administered 2012-05-14: 650 mg via ORAL

## 2012-05-14 MED ORDER — HEPARIN SOD (PORK) LOCK FLUSH 100 UNIT/ML IV SOLN
500.0000 [IU] | Freq: Once | INTRAVENOUS | Status: AC | PRN
  Administered 2012-05-14: 500 [IU]
  Filled 2012-05-14: qty 5

## 2012-05-14 MED ORDER — HYDROCODONE-ACETAMINOPHEN 5-325 MG PO TABS
2.0000 | ORAL_TABLET | Freq: Once | ORAL | Status: AC
  Administered 2012-05-14: 2 via ORAL

## 2012-05-14 MED ORDER — GABAPENTIN 300 MG PO CAPS: 300.0000 mg | ORAL_CAPSULE | Freq: Three times a day (TID) | ORAL | Status: DC

## 2012-05-14 MED ORDER — TRASTUZUMAB CHEMO INJECTION 440 MG
2.0000 mg/kg | Freq: Once | INTRAVENOUS | Status: AC
  Administered 2012-05-14: 189 mg via INTRAVENOUS
  Filled 2012-05-14: qty 9

## 2012-05-14 MED ORDER — DIPHENHYDRAMINE HCL 25 MG PO CAPS
50.0000 mg | ORAL_CAPSULE | Freq: Once | ORAL | Status: AC
  Administered 2012-05-14: 25 mg via ORAL

## 2012-05-14 MED ORDER — SODIUM CHLORIDE 0.9 % IJ SOLN
10.0000 mL | INTRAMUSCULAR | Status: DC | PRN
  Administered 2012-05-14: 10 mL
  Filled 2012-05-14: qty 10

## 2012-05-14 NOTE — Patient Instructions (Addendum)
[facility] Discharge Instructions for Patients Receiving Chemotherapy  Today you received the following chemotherapy agents;  Herceptin.   Herceptin is not know to cause nausea.  If you do have nausea take your nausea medication as directed.   If you develop nausea and vomiting that is not controlled by your nausea medication, call the clinic. If it is after clinic hours your family physician or the after hours number for the clinic or go to the Emergency Department.   BELOW ARE SYMPTOMS THAT SHOULD BE REPORTED IMMEDIATELY:  *FEVER GREATER THAN 100.5 F  *CHILLS WITH OR WITHOUT FEVER  NAUSEA AND VOMITING THAT IS NOT CONTROLLED WITH YOUR NAUSEA MEDICATION  *UNUSUAL SHORTNESS OF BREATH  *UNUSUAL BRUISING OR BLEEDING  TENDERNESS IN MOUTH AND THROAT WITH OR WITHOUT PRESENCE OF ULCERS  *URINARY PROBLEMS  *BOWEL PROBLEMS  UNUSUAL RASH Items with * indicate a potential emergency and should be followed up as soon as possible.   Feel free to call the clinic you have any questions or concerns. The clinic phone number is [phone].   I have been informed and understand all the instructions given to me. I know to contact the clinic, my physician, or go to the Emergency Department if any problems should occur. I do not have any questions at this time, but understand that I may call the clinic during office hours   should I have any questions or need assistance in obtaining follow up care.    __________________________________________  _____________  __________ Signature of Patient or Authorized Representative            Date                   Time    __________________________________________ Nurse's Signature

## 2012-05-14 NOTE — Progress Notes (Signed)
Pt c/o all over aching type pain in bones and muscles.  States 7/10 and appears to be near tears.  Notified Dr. Donnie Coffin and gave pt 2 vicodin per verbal order.  Pt reported pain not really improved yet about 30 minutes after taking vicodin.   Instructed pt it may take a little longer for the pills to work and to also try the new rx for neurontin by Dr. Donnie Coffin. She verbalized understanding.

## 2012-05-14 NOTE — Progress Notes (Signed)
Received an e-mail from [facility] stating she assisted Brandie Lopes (MRN [id number]) today.  Amiyah states she does not have insurance.  She mentioned the BCCCP program, but states she has not heard from anyone and cannot reach anyone. I called Christine to follow-up on her BCCCP status, per [place] Canes DSS stated Ms. Selinger has Medicare. Called Ms. Butrick to discussed her insurance coverage, no answer left message to return call.

## 2012-05-15 NOTE — Progress Notes (Signed)
Hematology and Oncology Follow Up Visit  Holly Parrish [id number] Dec 17, 1970 41 y.o. 05/15/2012 9:26 PM   DIAGNOSIS:   HER-2 positive breast cancer on [facility] chemotherapy due for day 7 cycle 3  PAST THERAPY:  3 cycles of TCH chemotherapy  Interim History:  The patient returns for day 7 cycle 3 of TCH. Her last cycle has been a little more complicated.her edema is better , but she is having quite a bit of neuropathic pain in herarms and legs . She has difficulty with ambulating asa  Result.she is quite teary and emotional. The pain meds are minimally effective  Medications: I have reviewed the patient's current medications.  Allergies:  Allergies  Allergen Reactions  . Nsaids Anaphylaxis  . Penicillins Anaphylaxis  . Tetanus Toxoids Anaphylaxis  . Hydrogen Peroxide Other (See Comments)    "It burns until I bleed."    Past Medical History, Surgical history, Social history, and Family History were reviewed and updated.  Review of Systems: Constitutional:  Negative for fever, chills, night sweats, anorexia, weight loss, pain. Cardiovascular: negative Respiratory: no cough, shortness of breath, or wheezing negative Neurological: negative Dermatological: negative ENT: negative Skin Gastrointestinal: negative Genito-Urinary: negative Hematological and Lymphatic: negative Breast: negative Musculoskeletal: negative Remaining ROS negative.  Physical Exam:  Blood pressure 128/93, pulse 101, temperature 98.6 F (37 C), temperature source Oral, resp. rate 20, height 5\' 2"  (1.575 m), weight 196 lb (88.905 kg).  ECOG: 0   HEENT:  Sclerae anicteric, conjunctivae pink.  Oropharynx clear.  No mucositis or candidiasis.  Nodes:  No cervical, supraclavicular, or axillary lymphadenopathy palpated.  Breast Exam:  Right breast is benign.  No masses, discharge, skin change, or nipple inversion. The previous documented mass in the right breast is no longer detectable. He barely feel a abnormal  lymph node in the right side.  Left breast is benign.  No masses, discharge, skin change, or nipple inversion..  Lungs:  Clear to auscultation bilaterally.  No crackles, rhonchi, or wheezes.  Heart:  Regular rate and rhythm.  Abdomen:  Soft, nontender.  Positive bowel sounds.  No organomegaly or masses palpated.  Musculoskeletal:  No focal spinal tenderness to palpation.  Extremities:  She has lower extremity edema. There is tenderness in the calf the right versus left. Edema is better .  Skin:  Benign.  Neuro:  Nonfocal.    Lab Results: Lab Results  Component Value Date   WBC 27.7* 05/14/2012   HGB 12.1 05/14/2012   HCT 37.5 05/14/2012   MCV 98.7 05/14/2012   PLT 316 05/14/2012     Chemistry      Component Value Date/Time   NA 136 05/14/2012 1408   NA 140 05/07/2012 0950   K 4.2 05/14/2012 1408   K 4.3 05/07/2012 0950   CL 103 05/14/2012 1408   CL 108 05/07/2012 0950   CO2 26 05/14/2012 1408   CO2 23 05/07/2012 0950   BUN 12.0 05/14/2012 1408   BUN 16 05/07/2012 0950   CREATININE 0.9 05/14/2012 1408   CREATININE 0.79 05/07/2012 0950      Component Value Date/Time   CALCIUM 9.3 05/14/2012 1408   CALCIUM 9.4 05/07/2012 0950   ALKPHOS 109 05/14/2012 1408   ALKPHOS 73 05/07/2012 0950   AST 30 05/14/2012 1408   AST 18 05/07/2012 0950   ALT 35 05/14/2012 1408   ALT 23 05/07/2012 0950   BILITOT 0.50 05/14/2012 1408   BILITOT 0.4 05/07/2012 0950       Radiological Studies:  No results found.   IMPRESSIONS AND PLAN: A 41 y.o. female with   History of locally advanced HER-2 positive, due for day 7 cycle 3 TCH chemotherapy. She is having more neuropathic pain. I have suggested we try gabapentin. In addition we will change chemo to include abraxane instead of taxotere and perhaps carboplatin. i will see her next week. Spent more than half the time coordinating care, as well as discussion of BMI and its implications.      Holly Parrish 10/19/20139:26 PM Cell [phone]

## 2012-05-17 ENCOUNTER — Telehealth: Payer: Self-pay | Admitting: *Deleted

## 2012-05-17 NOTE — Telephone Encounter (Signed)
Gave patient appointment 05-21-2012 lab and treatment  05-28-2012 lab and treatment  06-02-2012 lab and md   06-04-2012 treatment  06-11-2012 lab and treatment  06-18-2012 lab and md and treatment  06-25-2012 lab and md and treatment  Mailed out calendar to inform the patient of the dates and times  Sent michelle email to set up patient's treatment

## 2012-05-17 NOTE — Telephone Encounter (Signed)
Per staff message and POF I have scheduled appts.  [name]  

## 2012-05-20 ENCOUNTER — Other Ambulatory Visit: Payer: Self-pay | Admitting: *Deleted

## 2012-05-20 DIAGNOSIS — C50519 Malignant neoplasm of lower-outer quadrant of unspecified female breast: Secondary | ICD-10-CM

## 2012-05-21 ENCOUNTER — Ambulatory Visit (HOSPITAL_BASED_OUTPATIENT_CLINIC_OR_DEPARTMENT_OTHER): Payer: Medicare Other

## 2012-05-21 ENCOUNTER — Telehealth: Payer: Self-pay | Admitting: *Deleted

## 2012-05-21 ENCOUNTER — Other Ambulatory Visit: Payer: Self-pay | Admitting: *Deleted

## 2012-05-21 ENCOUNTER — Ambulatory Visit (HOSPITAL_BASED_OUTPATIENT_CLINIC_OR_DEPARTMENT_OTHER): Payer: Medicare Other | Admitting: Oncology

## 2012-05-21 ENCOUNTER — Other Ambulatory Visit (HOSPITAL_BASED_OUTPATIENT_CLINIC_OR_DEPARTMENT_OTHER): Payer: Medicare Other | Admitting: Lab

## 2012-05-21 VITALS — BP 105/71 | HR 83 | Temp 97.6°F | Resp 20 | Ht 62.0 in | Wt 200.2 lb

## 2012-05-21 DIAGNOSIS — Z5112 Encounter for antineoplastic immunotherapy: Secondary | ICD-10-CM

## 2012-05-21 DIAGNOSIS — Z170 Estrogen receptor positive status [ER+]: Secondary | ICD-10-CM

## 2012-05-21 DIAGNOSIS — C50519 Malignant neoplasm of lower-outer quadrant of unspecified female breast: Secondary | ICD-10-CM

## 2012-05-21 LAB — CBC WITH DIFFERENTIAL/PLATELET
Basophils Absolute: 0 10*3/uL (ref 0.0–0.1)
EOS%: 0 % (ref 0.0–7.0)
Eosinophils Absolute: 0 10*3/uL (ref 0.0–0.5)
HGB: 11.5 g/dL — ABNORMAL LOW (ref 11.6–15.9)
LYMPH%: 33.9 % (ref 14.0–49.7)
MCH: 32.4 pg (ref 25.1–34.0)
MCV: 96.6 fL (ref 79.5–101.0)
MONO%: 10.7 % (ref 0.0–14.0)
NEUT#: 4.6 10*3/uL (ref 1.5–6.5)
Platelets: 214 10*3/uL (ref 145–400)
RBC: 3.55 10*6/uL — ABNORMAL LOW (ref 3.70–5.45)
RDW: 16.1 % — ABNORMAL HIGH (ref 11.2–14.5)

## 2012-05-21 MED ORDER — ACETAMINOPHEN 325 MG PO TABS
650.0000 mg | ORAL_TABLET | Freq: Once | ORAL | Status: AC
  Administered 2012-05-21: 650 mg via ORAL

## 2012-05-21 MED ORDER — SODIUM CHLORIDE 0.9 % IV SOLN
Freq: Once | INTRAVENOUS | Status: AC
  Administered 2012-05-21: 12:00:00 via INTRAVENOUS

## 2012-05-21 MED ORDER — TRAMADOL HCL 50 MG PO TABS: 50.0000 mg | ORAL_TABLET | Freq: Four times a day (QID) | ORAL | Status: DC | PRN

## 2012-05-21 MED ORDER — TRASTUZUMAB CHEMO INJECTION 440 MG
2.0000 mg/kg | Freq: Once | INTRAVENOUS | Status: AC
  Administered 2012-05-21: 189 mg via INTRAVENOUS
  Filled 2012-05-21: qty 9

## 2012-05-21 MED ORDER — HEPARIN SOD (PORK) LOCK FLUSH 100 UNIT/ML IV SOLN
500.0000 [IU] | Freq: Once | INTRAVENOUS | Status: AC | PRN
  Administered 2012-05-21: 500 [IU]
  Filled 2012-05-21: qty 5

## 2012-05-21 MED ORDER — SODIUM CHLORIDE 0.9 % IJ SOLN
10.0000 mL | INTRAMUSCULAR | Status: DC | PRN
  Administered 2012-05-21: 10 mL
  Filled 2012-05-21: qty 10

## 2012-05-21 MED ORDER — DIPHENHYDRAMINE HCL 25 MG PO CAPS
50.0000 mg | ORAL_CAPSULE | Freq: Once | ORAL | Status: AC
  Administered 2012-05-21: 25 mg via ORAL

## 2012-05-21 NOTE — Patient Instructions (Signed)
[facility] Discharge Instructions for Patients Receiving Chemotherapy  Today you received the following antibody  agents -HERCEPTIN To help prevent nausea and vomiting after your treatment, we encourage you to take your nausea medication   Take it as often as prescribed.   If you develop nausea and vomiting that is not controlled by your nausea medication, call the clinic. If it is after clinic hours your family physician or the after hours number for the clinic or go to the Emergency Department.   BELOW ARE SYMPTOMS THAT SHOULD BE REPORTED IMMEDIATELY:  *FEVER GREATER THAN 100.5 F  *CHILLS WITH OR WITHOUT FEVER  NAUSEA AND VOMITING THAT IS NOT CONTROLLED WITH YOUR NAUSEA MEDICATION  *UNUSUAL SHORTNESS OF BREATH  *UNUSUAL BRUISING OR BLEEDING  TENDERNESS IN MOUTH AND THROAT WITH OR WITHOUT PRESENCE OF ULCERS  *URINARY PROBLEMS  *BOWEL PROBLEMS  UNUSUAL RASH Items with * indicate a potential emergency and should be followed up as soon as possible.  If this is your first treatment one of the nurses will contact you 24 hours after your treatment. Please let the nurse know about any problems that you may have experienced. Feel free to call the clinic you have any questions or concerns. The clinic phone number is [phone].   I have been informed and understand all the instructions given to me. I know to contact the clinic, my physician, or go to the Emergency Department if any problems should occur. I do not have any questions at this time, but understand that I may call the clinic during office hours   should I have any questions or need assistance in obtaining follow up care.    __________________________________________  _____________  __________ Signature of Patient or Authorized Representative            Date                   Time    __________________________________________ Nurse's  Signature    Trastuzumab injection for infusion What is this medicine? TRASTUZUMAB (tras TOO zoo mab) is a monoclonal antibody. It targets a protein called HER2. This protein is found in some stomach and breast cancers. This medicine can stop cancer cell growth. This medicine may be used with other cancer treatments. This medicine may be used for other purposes; ask your health care provider or pharmacist if you have questions. What should I tell my health care provider before I take this medicine? They need to know if you have any of these conditions: -heart disease -heart failure -infection (especially a virus infection such as chickenpox, cold sores, or herpes) -lung or breathing disease, like asthma -recent or ongoing radiation therapy -an unusual or allergic reaction to trastuzumab, benzyl alcohol, or other medications, foods, dyes, or preservatives -pregnant or trying to get pregnant -breast-feeding How should I use this medicine? This drug is given as an infusion into a vein. It is administered in a hospital or clinic by a specially trained health care professional. Talk to your pediatrician regarding the use of this medicine in children. This medicine is not approved for use in children. Overdosage: If you think you have taken too much of this medicine contact a poison control center or emergency room at once. NOTE: This medicine is only for you. Do not share this medicine with others. What  if I miss a dose? It is important not to miss a dose. Call your doctor or health care professional if you are unable to keep an appointment. What may interact with this medicine? -cyclophosphamide -doxorubicin -warfarin This list may not describe all possible interactions. Give your health care provider a list of all the medicines, herbs, non-prescription drugs, or dietary supplements you use. Also tell them if you smoke, drink alcohol, or use illegal drugs. Some items may interact with your  medicine. What should I watch for while using this medicine? Visit your doctor for checks on your progress. Report any side effects. Continue your course of treatment even though you feel ill unless your doctor tells you to stop. Call your doctor or health care professional for advice if you get a fever, chills or sore throat, or other symptoms of a cold or flu. Do not treat yourself. Try to avoid being around people who are sick. You may experience fever, chills and shaking during your first infusion. These effects are usually mild and can be treated with other medicines. Report any side effects during the infusion to your health care professional. Fever and chills usually do not happen with later infusions. What side effects may I notice from receiving this medicine? Side effects that you should report to your doctor or other health care professional as soon as possible: -breathing difficulties -chest pain or palpitations -cough -dizziness or fainting -fever or chills, sore throat -skin rash, itching or hives -swelling of the legs or ankles -unusually weak or tired Side effects that usually do not require medical attention (report to your doctor or other health care professional if they continue or are bothersome): -loss of appetite -headache -muscle aches -nausea This list may not describe all possible side effects. Call your doctor for medical advice about side effects. You may report side effects to FDA at 1-800-FDA-1088. Where should I keep my medicine? This drug is given in a hospital or clinic and will not be stored at home. NOTE: This sheet is a summary. It may not cover all possible information. If you have questions about this medicine, talk to your doctor, pharmacist, or health care provider.  2012, [employer]. (05/18/2009 1:43:15 PM)

## 2012-05-21 NOTE — Telephone Encounter (Signed)
Per desk nurse add on md appointment

## 2012-05-23 NOTE — Progress Notes (Signed)
Hematology and Oncology Follow Up Visit  Holly Parrish [id number] 03-Feb-1971 41 y.o. 05/23/2012 8:03 PM   DIAGNOSIS:   HER-2 positive breast cancer on [facility] chemotherapy due for day 1 cycle 4  PAST THERAPY:  3 cycles of TCH chemotherapy  Interim History:  The patient returns for day 1 cycle 4  of TCH. She continues to have pain in her legs, she had difficulty with the gabapentin . She had difficulty ambulating as a result and work has been difficult. The pain meds are minimally effective, she is more despondent as well. We have planned to go ahead with abraxane/carboplatin as well as herceptin.  Medications: I have reviewed the patient's current medications.  Allergies:  Allergies  Allergen Reactions  . Nsaids Anaphylaxis  . Penicillins Anaphylaxis  . Tetanus Toxoids Anaphylaxis  . Hydrogen Peroxide Other (See Comments)    "It burns until I bleed."    Past Medical History, Surgical history, Social history, and Family History were reviewed and updated.  Review of Systems: Constitutional:  Negative for fever, chills, night sweats, anorexia, weight loss, pain. Cardiovascular: negative Respiratory: no cough, shortness of breath, or wheezing negative Neurological: negative Dermatological: negative ENT: negative Skin Gastrointestinal: negative Genito-Urinary: negative Hematological and Lymphatic: negative Breast: negative Musculoskeletal: negative Remaining ROS negative.  Physical Exam:  Blood pressure 105/71, pulse 83, temperature 97.6 F (36.4 C), resp. rate 20, height 5\' 2"  (1.575 m), weight 200 lb 3.2 oz (90.81 kg).  ECOG: 0   HEENT:  Sclerae anicteric, conjunctivae pink.  Oropharynx clear.  No mucositis or candidiasis.  Nodes:  No cervical, supraclavicular, or axillary lymphadenopathy palpated.  Breast Exam:  Right breast is benign.  No masses, discharge, skin change, or nipple inversion. The previous documented mass in the right breast is no longer detectable. He  barely feel a abnormal lymph node in the right side.  Left breast is benign.  No masses, discharge, skin change, or nipple inversion..  Lungs:  Clear to auscultation bilaterally.  No crackles, rhonchi, or wheezes.  Heart:  Regular rate and rhythm.  Abdomen:  Soft, nontender.  Positive bowel sounds.  No organomegaly or masses palpated.  Musculoskeletal:  No focal spinal tenderness to palpation.  Extremities:  She has lower extremity edema. There is tenderness in the calf the right versus left. Edema is better .  Skin:  Benign.  Neuro:  Nonfocal.    Lab Results: Lab Results  Component Value Date   WBC 8.3 05/21/2012   HGB 11.5* 05/21/2012   HCT 34.3* 05/21/2012   MCV 96.6 05/21/2012   PLT 214 05/21/2012     Chemistry      Component Value Date/Time   NA 136 05/14/2012 1408   NA 140 05/07/2012 0950   K 4.2 05/14/2012 1408   K 4.3 05/07/2012 0950   CL 103 05/14/2012 1408   CL 108 05/07/2012 0950   CO2 26 05/14/2012 1408   CO2 23 05/07/2012 0950   BUN 12.0 05/14/2012 1408   BUN 16 05/07/2012 0950   CREATININE 0.9 05/14/2012 1408   CREATININE 0.79 05/07/2012 0950      Component Value Date/Time   CALCIUM 9.3 05/14/2012 1408   CALCIUM 9.4 05/07/2012 0950   ALKPHOS 109 05/14/2012 1408   ALKPHOS 73 05/07/2012 0950   AST 30 05/14/2012 1408   AST 18 05/07/2012 0950   ALT 35 05/14/2012 1408   ALT 23 05/07/2012 0950   BILITOT 0.50 05/14/2012 1408   BILITOT 0.4 05/07/2012 0950  Radiological Studies:  No results found.   IMPRESSIONS AND PLAN: A 41 y.o. female with   History of locally advanced HER-2 positive, due for day 1  cycle 4 TCH chemotherapy. We will try tramadol . We will go ahead with araxane/carboplatin Spent more than half the time coordinating care, as well as discussion of BMI and its implications.      Holly Parrish 10/27/20138:03 PM Cell [phone]

## 2012-05-27 ENCOUNTER — Other Ambulatory Visit: Payer: Self-pay | Admitting: Oncology

## 2012-05-27 DIAGNOSIS — C50519 Malignant neoplasm of lower-outer quadrant of unspecified female breast: Secondary | ICD-10-CM

## 2012-05-28 ENCOUNTER — Other Ambulatory Visit: Payer: Self-pay | Admitting: *Deleted

## 2012-05-28 ENCOUNTER — Other Ambulatory Visit (HOSPITAL_BASED_OUTPATIENT_CLINIC_OR_DEPARTMENT_OTHER): Payer: Medicare Other | Admitting: Lab

## 2012-05-28 ENCOUNTER — Ambulatory Visit (HOSPITAL_BASED_OUTPATIENT_CLINIC_OR_DEPARTMENT_OTHER): Payer: Medicare Other

## 2012-05-28 VITALS — BP 102/69 | HR 45 | Temp 98.1°F | Resp 18

## 2012-05-28 DIAGNOSIS — Z5112 Encounter for antineoplastic immunotherapy: Secondary | ICD-10-CM

## 2012-05-28 DIAGNOSIS — C50519 Malignant neoplasm of lower-outer quadrant of unspecified female breast: Secondary | ICD-10-CM

## 2012-05-28 DIAGNOSIS — Z5111 Encounter for antineoplastic chemotherapy: Secondary | ICD-10-CM

## 2012-05-28 DIAGNOSIS — C50919 Malignant neoplasm of unspecified site of unspecified female breast: Secondary | ICD-10-CM

## 2012-05-28 LAB — CBC WITH DIFFERENTIAL/PLATELET
BASO%: 0.1 % (ref 0.0–2.0)
Basophils Absolute: 0 10*3/uL (ref 0.0–0.1)
Eosinophils Absolute: 0 10*3/uL (ref 0.0–0.5)
HCT: 29.9 % — ABNORMAL LOW (ref 34.8–46.6)
HGB: 9.9 g/dL — ABNORMAL LOW (ref 11.6–15.9)
LYMPH%: 18.8 % (ref 14.0–49.7)
MONO#: 0.2 10*3/uL (ref 0.1–0.9)
NEUT#: 5.4 10*3/uL (ref 1.5–6.5)
NEUT%: 78.8 % — ABNORMAL HIGH (ref 38.4–76.8)
Platelets: 179 10*3/uL (ref 145–400)
WBC: 6.9 10*3/uL (ref 3.9–10.3)
lymph#: 1.3 10*3/uL (ref 0.9–3.3)

## 2012-05-28 MED ORDER — SODIUM CHLORIDE 0.9 % IJ SOLN
10.0000 mL | INTRAMUSCULAR | Status: DC | PRN
  Administered 2012-05-28: 10 mL
  Filled 2012-05-28: qty 10

## 2012-05-28 MED ORDER — SODIUM CHLORIDE 0.9 % IV SOLN
Freq: Once | INTRAVENOUS | Status: AC
  Administered 2012-05-28: 09:00:00 via INTRAVENOUS

## 2012-05-28 MED ORDER — HEPARIN SOD (PORK) LOCK FLUSH 100 UNIT/ML IV SOLN
500.0000 [IU] | Freq: Once | INTRAVENOUS | Status: AC | PRN
  Administered 2012-05-28: 500 [IU]
  Filled 2012-05-28: qty 5

## 2012-05-28 MED ORDER — FAMOTIDINE IN NACL 20-0.9 MG/50ML-% IV SOLN
20.0000 mg | Freq: Once | INTRAVENOUS | Status: AC
  Administered 2012-05-28: 20 mg via INTRAVENOUS

## 2012-05-28 MED ORDER — DEXAMETHASONE SODIUM PHOSPHATE 4 MG/ML IJ SOLN
20.0000 mg | Freq: Once | INTRAMUSCULAR | Status: AC
  Administered 2012-05-28: 20 mg via INTRAVENOUS

## 2012-05-28 MED ORDER — TRASTUZUMAB CHEMO INJECTION 440 MG
2.0000 mg/kg | Freq: Once | INTRAVENOUS | Status: AC
  Administered 2012-05-28: 189 mg via INTRAVENOUS
  Filled 2012-05-28: qty 9

## 2012-05-28 MED ORDER — SODIUM CHLORIDE 0.9 % IV SOLN
276.2000 mg | Freq: Once | INTRAVENOUS | Status: AC
  Administered 2012-05-28: 280 mg via INTRAVENOUS
  Filled 2012-05-28: qty 28

## 2012-05-28 MED ORDER — ONDANSETRON 16 MG/50ML IVPB (CHCC)
16.0000 mg | Freq: Once | INTRAVENOUS | Status: AC
  Administered 2012-05-28: 16 mg via INTRAVENOUS

## 2012-05-28 MED ORDER — OXYCODONE-ACETAMINOPHEN 10-325 MG PO TABS: 1.0000 | ORAL_TABLET | ORAL | Status: DC | PRN

## 2012-05-28 MED ORDER — ACETAMINOPHEN 325 MG PO TABS
650.0000 mg | ORAL_TABLET | Freq: Once | ORAL | Status: AC
  Administered 2012-05-28: 650 mg via ORAL

## 2012-05-28 MED ORDER — PACLITAXEL CHEMO INJECTION 300 MG/50ML
80.0000 mg/m2 | Freq: Once | INTRAVENOUS | Status: AC
  Administered 2012-05-28: 156 mg via INTRAVENOUS
  Filled 2012-05-28: qty 26

## 2012-05-28 MED ORDER — DIPHENHYDRAMINE HCL 50 MG/ML IJ SOLN
50.0000 mg | Freq: Once | INTRAMUSCULAR | Status: AC
  Administered 2012-05-28: 50 mg via INTRAVENOUS

## 2012-05-28 NOTE — Patient Instructions (Signed)
[facility] Discharge Instructions for Patients Receiving Chemotherapy  Today you received the following chemotherapy agents Taxol To help prevent nausea and vomiting after your treatment, we encourage you to take your nausea medication as directed by MD.  If you develop nausea and vomiting that is not controlled by your nausea medication, call the clinic. If it is after clinic hours your family physician or the after hours number for the clinic or go to the Emergency Department.   BELOW ARE SYMPTOMS THAT SHOULD BE REPORTED IMMEDIATELY:  *FEVER GREATER THAN 100.5 F  *CHILLS WITH OR WITHOUT FEVER  NAUSEA AND VOMITING THAT IS NOT CONTROLLED WITH YOUR NAUSEA MEDICATION  *UNUSUAL SHORTNESS OF BREATH  *UNUSUAL BRUISING OR BLEEDING  TENDERNESS IN MOUTH AND THROAT WITH OR WITHOUT PRESENCE OF ULCERS  *URINARY PROBLEMS  *BOWEL PROBLEMS  UNUSUAL RASH Items with * indicate a potential emergency and should be followed up as soon as possible.  Feel free to call the clinic you have any questions or concerns. The clinic phone number is [phone].   I have been informed and understand all the instructions given to me. I know to contact the clinic, my physician, or go to the Emergency Department if any problems should occur. I do not have any questions at this time, but understand that I may call the clinic during office hours   should I have any questions or need assistance in obtaining follow up care.    __________________________________________  _____________  __________ Signature of Patient or Authorized Representative            Date                   Time        __________________________________________ Nurse's Signature       Paclitaxel injection What is this medicine? PACLITAXEL (PAK li TAX el) is a chemotherapy drug. It targets fast dividing cells, like cancer cells, and causes these cells to die. This medicine is used to treat ovarian cancer,  breast cancer, and other cancers. This medicine may be used for other purposes; ask your health care provider or pharmacist if you have questions. What should I tell my health care provider before I take this medicine? They need to know if you have any of these conditions: -blood disorders -irregular heartbeat -infection (especially a virus infection such as chickenpox, cold sores, or herpes) -liver disease -previous or ongoing radiation therapy -an unusual or allergic reaction to paclitaxel, alcohol, polyoxyethylated castor oil, other chemotherapy agents, other medicines, foods, dyes, or preservatives -pregnant or trying to get pregnant -breast-feeding How should I use this medicine? This drug is given as an infusion into a vein. It is administered in a hospital or clinic by a specially trained health care professional. Talk to your pediatrician regarding the use of this medicine in children. Special care may be needed. Overdosage: If you think you have taken too much of this medicine contact a poison control center or emergency room at once. NOTE: This medicine is only for you. Do not share this medicine with others. What if I miss a dose? It is important not to miss your dose. Call your doctor or health care professional if you are unable to keep an appointment. What may interact with this medicine? Do not take this medicine with any of the following medications: -disulfiram -metronidazole This medicine may also interact with the following medications: -cyclosporine -dexamethasone -diazepam -ketoconazole -medicines to increase blood counts like filgrastim, pegfilgrastim,  sargramostim -other chemotherapy drugs like cisplatin, doxorubicin, epirubicin, etoposide, teniposide, vincristine -quinidine -testosterone -vaccines -verapamil Talk to your doctor or health care professional before taking any of these medicines: -acetaminophen -aspirin -ibuprofen -ketoprofen -naproxen This  list may not describe all possible interactions. Give your health care provider a list of all the medicines, herbs, non-prescription drugs, or dietary supplements you use. Also tell them if you smoke, drink alcohol, or use illegal drugs. Some items may interact with your medicine. What should I watch for while using this medicine? Your condition will be monitored carefully while you are receiving this medicine. You will need important blood work done while you are taking this medicine. This drug may make you feel generally unwell. This is not uncommon, as chemotherapy can affect healthy cells as well as cancer cells. Report any side effects. Continue your course of treatment even though you feel ill unless your doctor tells you to stop. In some cases, you may be given additional medicines to help with side effects. Follow all directions for their use. Call your doctor or health care professional for advice if you get a fever, chills or sore throat, or other symptoms of a cold or flu. Do not treat yourself. This drug decreases your body's ability to fight infections. Try to avoid being around people who are sick. This medicine may increase your risk to bruise or bleed. Call your doctor or health care professional if you notice any unusual bleeding. Be careful brushing and flossing your teeth or using a toothpick because you may get an infection or bleed more easily. If you have any dental work done, tell your dentist you are receiving this medicine. Avoid taking products that contain aspirin, acetaminophen, ibuprofen, naproxen, or ketoprofen unless instructed by your doctor. These medicines may hide a fever. Do not become pregnant while taking this medicine. Women should inform their doctor if they wish to become pregnant or think they might be pregnant. There is a potential for serious side effects to an unborn child. Talk to your health care professional or pharmacist for more information. Do not  breast-feed an infant while taking this medicine. Men are advised not to father a child while receiving this medicine. What side effects may I notice from receiving this medicine? Side effects that you should report to your doctor or health care professional as soon as possible: -allergic reactions like skin rash, itching or hives, swelling of the face, lips, or tongue -low blood counts - This drug may decrease the number of white blood cells, red blood cells and platelets. You may be at increased risk for infections and bleeding. -signs of infection - fever or chills, cough, sore throat, pain or difficulty passing urine -signs of decreased platelets or bleeding - bruising, pinpoint red spots on the skin, black, tarry stools, nosebleeds -signs of decreased red blood cells - unusually weak or tired, fainting spells, lightheadedness -breathing problems -chest pain -high or low blood pressure -mouth sores -nausea and vomiting -pain, swelling, redness or irritation at the injection site -pain, tingling, numbness in the hands or feet -slow or irregular heartbeat -swelling of the ankle, feet, hands Side effects that usually do not require medical attention (report to your doctor or health care professional if they continue or are bothersome): -bone pain -complete hair loss including hair on your head, underarms, pubic hair, eyebrows, and eyelashes -changes in the color of fingernails -diarrhea -loosening of the fingernails -loss of appetite -muscle or joint pain -red flush to skin -sweating This  list may not describe all possible side effects. Call your doctor for medical advice about side effects. You may report side effects to FDA at 1-800-FDA-1088. Where should I keep my medicine? This drug is given in a hospital or clinic and will not be stored at home. NOTE: This sheet is a summary. It may not cover all possible information. If you have questions about this medicine, talk to your  doctor, pharmacist, or health care provider.  2012, [employer]. (06/26/2008 11:54:26 AM)

## 2012-05-31 ENCOUNTER — Telehealth: Payer: Self-pay | Admitting: *Deleted

## 2012-05-31 NOTE — Telephone Encounter (Signed)
Message copied by Augusto Garbe on Mon May 31, 2012 11:14 AM ------      Message from: Augusto Garbe      Created: Fri May 28, 2012  4:45 PM      Regarding: Chemotherapy F/U       1st [facility]  Pt. Of Dr. Donnie Coffin

## 2012-05-31 NOTE — Telephone Encounter (Signed)
Message left requesting a return call from patient for f/u after new taxol treatment added to Carboplatin/Herceptin.  Awaiting return call from patient.

## 2012-06-01 ENCOUNTER — Other Ambulatory Visit: Payer: Self-pay | Admitting: *Deleted

## 2012-06-01 DIAGNOSIS — C50519 Malignant neoplasm of lower-outer quadrant of unspecified female breast: Secondary | ICD-10-CM

## 2012-06-02 ENCOUNTER — Other Ambulatory Visit: Payer: Medicare Other | Admitting: Lab

## 2012-06-02 ENCOUNTER — Ambulatory Visit: Payer: Medicare Other | Admitting: Oncology

## 2012-06-04 ENCOUNTER — Other Ambulatory Visit: Payer: Self-pay | Admitting: Oncology

## 2012-06-04 ENCOUNTER — Telehealth: Payer: Self-pay | Admitting: *Deleted

## 2012-06-04 ENCOUNTER — Other Ambulatory Visit: Payer: Self-pay | Admitting: *Deleted

## 2012-06-04 ENCOUNTER — Other Ambulatory Visit (HOSPITAL_BASED_OUTPATIENT_CLINIC_OR_DEPARTMENT_OTHER): Payer: Medicare Other | Admitting: Lab

## 2012-06-04 ENCOUNTER — Ambulatory Visit (HOSPITAL_BASED_OUTPATIENT_CLINIC_OR_DEPARTMENT_OTHER): Payer: Medicare Other

## 2012-06-04 VITALS — BP 109/74 | HR 85 | Temp 97.9°F | Resp 20

## 2012-06-04 DIAGNOSIS — C50519 Malignant neoplasm of lower-outer quadrant of unspecified female breast: Secondary | ICD-10-CM

## 2012-06-04 DIAGNOSIS — C773 Secondary and unspecified malignant neoplasm of axilla and upper limb lymph nodes: Secondary | ICD-10-CM

## 2012-06-04 DIAGNOSIS — Z5112 Encounter for antineoplastic immunotherapy: Secondary | ICD-10-CM

## 2012-06-04 LAB — CBC WITH DIFFERENTIAL/PLATELET
Basophils Absolute: 0 10*3/uL (ref 0.0–0.1)
EOS%: 0.5 % (ref 0.0–7.0)
HCT: 31.1 % — ABNORMAL LOW (ref 34.8–46.6)
HGB: 10.3 g/dL — ABNORMAL LOW (ref 11.6–15.9)
LYMPH%: 43.3 % (ref 14.0–49.7)
MCH: 33 pg (ref 25.1–34.0)
MONO#: 0.4 10*3/uL (ref 0.1–0.9)
NEUT%: 50.6 % (ref 38.4–76.8)
Platelets: 346 10*3/uL (ref 145–400)
lymph#: 2.7 10*3/uL (ref 0.9–3.3)

## 2012-06-04 MED ORDER — TRASTUZUMAB CHEMO INJECTION 440 MG
2.0000 mg/kg | Freq: Once | INTRAVENOUS | Status: AC
  Administered 2012-06-04: 189 mg via INTRAVENOUS
  Filled 2012-06-04: qty 9

## 2012-06-04 MED ORDER — DIPHENHYDRAMINE HCL 50 MG/ML IJ SOLN
50.0000 mg | Freq: Once | INTRAMUSCULAR | Status: AC
  Administered 2012-06-04: 50 mg via INTRAVENOUS

## 2012-06-04 MED ORDER — FAMOTIDINE IN NACL 20-0.9 MG/50ML-% IV SOLN
20.0000 mg | Freq: Once | INTRAVENOUS | Status: AC
  Administered 2012-06-04: 20 mg via INTRAVENOUS

## 2012-06-04 MED ORDER — SODIUM CHLORIDE 0.9 % IJ SOLN
10.0000 mL | INTRAMUSCULAR | Status: DC | PRN
  Filled 2012-06-04: qty 10

## 2012-06-04 MED ORDER — ONDANSETRON 16 MG/50ML IVPB (CHCC)
16.0000 mg | Freq: Once | INTRAVENOUS | Status: AC
  Administered 2012-06-04: 16 mg via INTRAVENOUS

## 2012-06-04 MED ORDER — DEXAMETHASONE SODIUM PHOSPHATE 4 MG/ML IJ SOLN
20.0000 mg | Freq: Once | INTRAMUSCULAR | Status: AC
  Administered 2012-06-04: 20 mg via INTRAVENOUS

## 2012-06-04 MED ORDER — HEPARIN SOD (PORK) LOCK FLUSH 100 UNIT/ML IV SOLN
500.0000 [IU] | Freq: Once | INTRAVENOUS | Status: DC | PRN
  Filled 2012-06-04: qty 5

## 2012-06-04 MED ORDER — DIPHENHYDRAMINE HCL 25 MG PO CAPS: 50.0000 mg | ORAL_CAPSULE | Freq: Once | ORAL | Status: DC

## 2012-06-04 MED ORDER — SODIUM CHLORIDE 0.9 % IV SOLN
276.2000 mg | Freq: Once | INTRAVENOUS | Status: AC
  Administered 2012-06-04: 280 mg via INTRAVENOUS
  Filled 2012-06-04: qty 28

## 2012-06-04 MED ORDER — SODIUM CHLORIDE 0.9 % IJ SOLN
10.0000 mL | INTRAMUSCULAR | Status: DC | PRN
  Administered 2012-06-04: 10 mL
  Filled 2012-06-04: qty 10

## 2012-06-04 MED ORDER — SODIUM CHLORIDE 0.9 % IV SOLN: Freq: Once | INTRAVENOUS | Status: DC

## 2012-06-04 MED ORDER — HEPARIN SOD (PORK) LOCK FLUSH 100 UNIT/ML IV SOLN
500.0000 [IU] | Freq: Once | INTRAVENOUS | Status: AC | PRN
  Administered 2012-06-04: 500 [IU]
  Filled 2012-06-04: qty 5

## 2012-06-04 MED ORDER — SODIUM CHLORIDE 0.9 % IV SOLN
Freq: Once | INTRAVENOUS | Status: AC
  Administered 2012-06-04: 11:00:00 via INTRAVENOUS

## 2012-06-04 MED ORDER — PACLITAXEL CHEMO INJECTION 300 MG/50ML
80.0000 mg/m2 | Freq: Once | INTRAVENOUS | Status: AC
  Administered 2012-06-04: 156 mg via INTRAVENOUS
  Filled 2012-06-04: qty 26

## 2012-06-04 MED ORDER — ACETAMINOPHEN 325 MG PO TABS
650.0000 mg | ORAL_TABLET | Freq: Once | ORAL | Status: AC
Start: 2012-06-04 — End: 2012-06-04
  Administered 2012-06-04: 650 mg via ORAL

## 2012-06-04 NOTE — Patient Instructions (Addendum)
[facility] Discharge Instructions for Patients Receiving Chemotherapy  Today you received the following chemotherapy agents: taxol, carboplatin, herceptin To help prevent nausea and vomiting after your treatment, we encourage you to take your nausea medication. Take it as often as prescribed.     If you develop nausea and vomiting that is not controlled by your nausea medication, call the clinic. If it is after clinic hours your family physician or the after hours number for the clinic or go to the Emergency Department.   BELOW ARE SYMPTOMS THAT SHOULD BE REPORTED IMMEDIATELY:  *FEVER GREATER THAN 100.5 F  *CHILLS WITH OR WITHOUT FEVER  NAUSEA AND VOMITING THAT IS NOT CONTROLLED WITH YOUR NAUSEA MEDICATION  *UNUSUAL SHORTNESS OF BREATH  *UNUSUAL BRUISING OR BLEEDING  TENDERNESS IN MOUTH AND THROAT WITH OR WITHOUT PRESENCE OF ULCERS  *URINARY PROBLEMS  *BOWEL PROBLEMS  UNUSUAL RASH Items with * indicate a potential emergency and should be followed up as soon as possible.  Feel free to call the clinic you have any questions or concerns. The clinic phone number is [phone].   I have been informed and understand all the instructions given to me. I know to contact the clinic, my physician, or go to the Emergency Department if any problems should occur. I do not have any questions at this time, but understand that I may call the clinic during office hours   should I have any questions or need assistance in obtaining follow up care.    __________________________________________  _____________  __________ Signature of Patient or Authorized Representative            Date                   Time    __________________________________________ Nurse's Signature

## 2012-06-04 NOTE — Telephone Encounter (Signed)
Add on md visit for 06-11-2012   Sent email to [place] and tanya to adjust the patient treatment for 06-11-2012

## 2012-06-07 ENCOUNTER — Ambulatory Visit: Payer: Medicare Other

## 2012-06-09 ENCOUNTER — Other Ambulatory Visit: Payer: Self-pay | Admitting: *Deleted

## 2012-06-11 ENCOUNTER — Other Ambulatory Visit: Payer: Self-pay | Admitting: *Deleted

## 2012-06-11 ENCOUNTER — Other Ambulatory Visit: Payer: Medicare Other | Admitting: Lab

## 2012-06-11 ENCOUNTER — Ambulatory Visit: Payer: Medicare Other | Admitting: Oncology

## 2012-06-11 ENCOUNTER — Other Ambulatory Visit (HOSPITAL_BASED_OUTPATIENT_CLINIC_OR_DEPARTMENT_OTHER): Payer: Medicare Other | Admitting: Lab

## 2012-06-11 ENCOUNTER — Ambulatory Visit (HOSPITAL_BASED_OUTPATIENT_CLINIC_OR_DEPARTMENT_OTHER): Payer: Medicare Other | Admitting: Oncology

## 2012-06-11 ENCOUNTER — Ambulatory Visit (HOSPITAL_BASED_OUTPATIENT_CLINIC_OR_DEPARTMENT_OTHER): Payer: Medicare Other

## 2012-06-11 ENCOUNTER — Ambulatory Visit: Payer: Medicare Other

## 2012-06-11 VITALS — BP 124/80 | HR 80 | Temp 98.3°F | Resp 20 | Ht 62.0 in | Wt 202.7 lb

## 2012-06-11 DIAGNOSIS — Z170 Estrogen receptor positive status [ER+]: Secondary | ICD-10-CM

## 2012-06-11 DIAGNOSIS — Z5111 Encounter for antineoplastic chemotherapy: Secondary | ICD-10-CM

## 2012-06-11 DIAGNOSIS — C50519 Malignant neoplasm of lower-outer quadrant of unspecified female breast: Secondary | ICD-10-CM

## 2012-06-11 DIAGNOSIS — Z5112 Encounter for antineoplastic immunotherapy: Secondary | ICD-10-CM

## 2012-06-11 DIAGNOSIS — C50919 Malignant neoplasm of unspecified site of unspecified female breast: Secondary | ICD-10-CM

## 2012-06-11 LAB — CBC WITH DIFFERENTIAL/PLATELET
Basophils Absolute: 0 10*3/uL (ref 0.0–0.1)
Eosinophils Absolute: 0 10*3/uL (ref 0.0–0.5)
HCT: 31.2 % — ABNORMAL LOW (ref 34.8–46.6)
HGB: 10.2 g/dL — ABNORMAL LOW (ref 11.6–15.9)
MCH: 33.4 pg (ref 25.1–34.0)
MCV: 102.3 fL — ABNORMAL HIGH (ref 79.5–101.0)
NEUT#: 1.4 10*3/uL — ABNORMAL LOW (ref 1.5–6.5)
NEUT%: 35 % — ABNORMAL LOW (ref 38.4–76.8)
RDW: 19.5 % — ABNORMAL HIGH (ref 11.2–14.5)
lymph#: 2.3 10*3/uL (ref 0.9–3.3)

## 2012-06-11 MED ORDER — OXYCODONE-ACETAMINOPHEN 10-325 MG PO TABS: 1.0000 | ORAL_TABLET | ORAL | Status: DC | PRN

## 2012-06-11 MED ORDER — DEXAMETHASONE SODIUM PHOSPHATE 4 MG/ML IJ SOLN
20.0000 mg | Freq: Once | INTRAMUSCULAR | Status: AC
  Administered 2012-06-11: 20 mg via INTRAVENOUS

## 2012-06-11 MED ORDER — SODIUM CHLORIDE 0.9 % IV SOLN
276.2000 mg | Freq: Once | INTRAVENOUS | Status: AC
  Administered 2012-06-11: 280 mg via INTRAVENOUS
  Filled 2012-06-11: qty 28

## 2012-06-11 MED ORDER — SODIUM CHLORIDE 0.9 % IJ SOLN
10.0000 mL | INTRAMUSCULAR | Status: DC | PRN
  Administered 2012-06-11: 10 mL
  Filled 2012-06-11: qty 10

## 2012-06-11 MED ORDER — HEPARIN SOD (PORK) LOCK FLUSH 100 UNIT/ML IV SOLN
500.0000 [IU] | Freq: Once | INTRAVENOUS | Status: AC | PRN
  Administered 2012-06-11: 500 [IU]
  Filled 2012-06-11: qty 5

## 2012-06-11 MED ORDER — FAMOTIDINE IN NACL 20-0.9 MG/50ML-% IV SOLN
20.0000 mg | Freq: Once | INTRAVENOUS | Status: AC
  Administered 2012-06-11: 20 mg via INTRAVENOUS

## 2012-06-11 MED ORDER — ACETAMINOPHEN 325 MG PO TABS
650.0000 mg | ORAL_TABLET | Freq: Once | ORAL | Status: AC
  Administered 2012-06-11: 650 mg via ORAL

## 2012-06-11 MED ORDER — PACLITAXEL CHEMO INJECTION 300 MG/50ML
80.0000 mg/m2 | Freq: Once | INTRAVENOUS | Status: AC
  Administered 2012-06-11: 156 mg via INTRAVENOUS
  Filled 2012-06-11: qty 26

## 2012-06-11 MED ORDER — DIPHENHYDRAMINE HCL 50 MG/ML IJ SOLN
50.0000 mg | Freq: Once | INTRAMUSCULAR | Status: AC
  Administered 2012-06-11: 50 mg via INTRAVENOUS

## 2012-06-11 MED ORDER — ONDANSETRON 16 MG/50ML IVPB (CHCC)
16.0000 mg | Freq: Once | INTRAVENOUS | Status: AC
  Administered 2012-06-11: 16 mg via INTRAVENOUS

## 2012-06-11 MED ORDER — TRASTUZUMAB CHEMO INJECTION 440 MG
2.0000 mg/kg | Freq: Once | INTRAVENOUS | Status: AC
  Administered 2012-06-11: 189 mg via INTRAVENOUS
  Filled 2012-06-11: qty 9

## 2012-06-11 MED ORDER — SODIUM CHLORIDE 0.9 % IV SOLN
Freq: Once | INTRAVENOUS | Status: AC
  Administered 2012-06-11: 10:00:00 via INTRAVENOUS

## 2012-06-11 NOTE — Progress Notes (Signed)
Hematology and Oncology Follow Up Visit  Holly Parrish [id number] [birth year] 41 y.o. 06/11/2012 9:23 AM   DIAGNOSIS:   HER-2 positive breast cancer on [facility] chemotherapy due for day 1 cycle 5  PAST THERAPY:  3 cycles of [facility] chemotherapy  Interim History:  The patient returns for day 1 cycle 5  Of Taxol carboplatin and Herceptin.. She is tolerating this much better than she has in the past. She still is little bit of residual numbness of the fingers. But the numbness that she was experiencing in her face is improved. She is taking gabapentin at nighttime. She's also taking pain medication during the day. She also is not taking her steroids which I explained is not really necessary in this setting. She has a MRI of her breasts up for next week as well.  Medications: I have reviewed the patient's current medications.  Allergies:  Allergies  Allergen Reactions  . Nsaids Anaphylaxis  . Penicillins Anaphylaxis  . Tetanus Toxoids Anaphylaxis  . Hydrogen Peroxide Other (See Comments)    "It burns until I bleed."    Past Medical History, Surgical history, Social history, and Family History were reviewed and updated.  Review of Systems: Constitutional:  Negative for fever, chills, night sweats, anorexia, weight loss, pain. Cardiovascular: negative Respiratory: no cough, shortness of breath, or wheezing negative Neurological: negative Dermatological: negative ENT: negative Skin Gastrointestinal: negative Genito-Urinary: negative Hematological and Lymphatic: negative Breast: negative Musculoskeletal: negative Remaining ROS negative.  Physical Exam:  Blood pressure 124/80, pulse 80, temperature 98.3 F (36.8 C), temperature source Oral, resp. rate 20, height 5\' 2"  (1.575 m), weight 202 lb 11.2 oz (91.944 kg).  ECOG: 0   HEENT:  Sclerae anicteric, conjunctivae pink.  Oropharynx clear.  No mucositis or candidiasis.  Nodes:  No cervical, supraclavicular, or axillary lymphadenopathy  palpated.  Breast Exam:  Right breast is benign.  No masses, discharge, skin change, or nipple inversion. The previous documented mass in the right breast is no longer detectable. He barely feel a abnormal lymph node in the right side.  Left breast is benign.  No masses, discharge, skin change, or nipple inversion..  Lungs:  Clear to auscultation bilaterally.  No crackles, rhonchi, or wheezes.  Heart:  Regular rate and rhythm.  Abdomen:  Soft, nontender.  Positive bowel sounds.  No organomegaly or masses palpated.  Musculoskeletal:  No focal spinal tenderness to palpation.  Extremities:  She has lower extremity edema. There is tenderness in the calf the right versus left. Edema is better .  Skin:  Benign.  Neuro:  Nonfocal.    Lab Results: Lab Results  Component Value Date   WBC 4.1 06/11/2012   HGB 10.2* 06/11/2012   HCT 31.2* 06/11/2012   MCV 102.3* 06/11/2012   PLT 423* 06/11/2012     Chemistry      Component Value Date/Time   NA 136 05/14/2012 1408   NA 140 05/07/2012 0950   K 4.2 05/14/2012 1408   K 4.3 05/07/2012 0950   CL 103 05/14/2012 1408   CL 108 05/07/2012 0950   CO2 26 05/14/2012 1408   CO2 23 05/07/2012 0950   BUN 12.0 05/14/2012 1408   BUN 16 05/07/2012 0950   CREATININE 0.9 05/14/2012 1408   CREATININE 0.79 05/07/2012 0950      Component Value Date/Time   CALCIUM 9.3 05/14/2012 1408   CALCIUM 9.4 05/07/2012 0950   ALKPHOS 109 05/14/2012 1408   ALKPHOS 73 05/07/2012 0950   AST 30 05/14/2012 1408  AST 18 05/07/2012 0950   ALT 35 05/14/2012 1408   ALT 23 05/07/2012 0950   BILITOT 0.50 05/14/2012 1408   BILITOT 0.4 05/07/2012 0950       Radiological Studies:  No results found.   IMPRESSIONS AND PLAN: A 41 y.o. female with   History of locally advanced HER-2 positive, due for day 1  cycle 5 carboplatin Taxol and Herceptin given weekly. This is week 3 of this cycle. She will be receiving Herceptin alone next week. chemotherapy. We will try tramadol .Spent  more than half the time coordinating care, as well as discussion of BMI and its implications.      Holly Parrish 11/15/20139:23 AM Cell [id number]

## 2012-06-11 NOTE — Patient Instructions (Addendum)
[facility] Discharge Instructions for Patients Receiving Chemotherapy  Today you received the following chemotherapy agents Taxol, Carboplatin and Herceptin.  To help prevent nausea and vomiting after your treatment, we encourage you to take your nausea medication. Begin taking your nausea medication as often as prescribed for by Dr. Donnie Coffin.    If you develop nausea and vomiting that is not controlled by your nausea medication, call the clinic. If it is after clinic hours your family physician or the after hours number for the clinic or go to the Emergency Department.   BELOW ARE SYMPTOMS THAT SHOULD BE REPORTED IMMEDIATELY:  *FEVER GREATER THAN 100.5 F  *CHILLS WITH OR WITHOUT FEVER  NAUSEA AND VOMITING THAT IS NOT CONTROLLED WITH YOUR NAUSEA MEDICATION  *UNUSUAL SHORTNESS OF BREATH  *UNUSUAL BRUISING OR BLEEDING  TENDERNESS IN MOUTH AND THROAT WITH OR WITHOUT PRESENCE OF ULCERS  *URINARY PROBLEMS  *BOWEL PROBLEMS  UNUSUAL RASH Items with * indicate a potential emergency and should be followed up as soon as possible.  One of the nurses will contact you 24 hours after your treatment. Please let the nurse know about any problems that you may have experienced. Feel free to call the clinic you have any questions or concerns. The clinic phone number is [phone].   I have been informed and understand all the instructions given to me. I know to contact the clinic, my physician, or go to the Emergency Department if any problems should occur. I do not have any questions at this time, but understand that I may call the clinic during office hours   should I have any questions or need assistance in obtaining follow up care.    __________________________________________  _____________  __________ Signature of Patient or Authorized Representative            Date                   Time    __________________________________________ Nurse's Signature

## 2012-06-11 NOTE — Progress Notes (Signed)
1610 Ok to treat today with ANC @ 1.4, per Dr. Donnie Coffin.

## 2012-06-14 ENCOUNTER — Other Ambulatory Visit: Payer: Self-pay | Admitting: *Deleted

## 2012-06-14 DIAGNOSIS — C50519 Malignant neoplasm of lower-outer quadrant of unspecified female breast: Secondary | ICD-10-CM

## 2012-06-16 ENCOUNTER — Telehealth: Payer: Self-pay | Admitting: Genetic Counselor

## 2012-06-16 NOTE — Telephone Encounter (Signed)
Left good news on test results and asked that she call back.

## 2012-06-18 ENCOUNTER — Other Ambulatory Visit: Payer: Medicare Other | Admitting: Lab

## 2012-06-18 ENCOUNTER — Ambulatory Visit: Payer: Medicare Other | Admitting: Oncology

## 2012-06-18 ENCOUNTER — Ambulatory Visit: Payer: Medicare Other

## 2012-06-18 ENCOUNTER — Other Ambulatory Visit: Payer: Medicare Other

## 2012-06-25 ENCOUNTER — Other Ambulatory Visit (HOSPITAL_BASED_OUTPATIENT_CLINIC_OR_DEPARTMENT_OTHER): Payer: Medicare Other | Admitting: Lab

## 2012-06-25 ENCOUNTER — Telehealth: Payer: Self-pay | Admitting: *Deleted

## 2012-06-25 ENCOUNTER — Ambulatory Visit (HOSPITAL_BASED_OUTPATIENT_CLINIC_OR_DEPARTMENT_OTHER): Payer: Medicare Other

## 2012-06-25 ENCOUNTER — Ambulatory Visit (HOSPITAL_BASED_OUTPATIENT_CLINIC_OR_DEPARTMENT_OTHER): Payer: Medicare Other | Admitting: Oncology

## 2012-06-25 VITALS — BP 110/75 | HR 79 | Temp 97.9°F | Resp 20 | Ht 62.0 in | Wt 204.7 lb

## 2012-06-25 DIAGNOSIS — C773 Secondary and unspecified malignant neoplasm of axilla and upper limb lymph nodes: Secondary | ICD-10-CM

## 2012-06-25 DIAGNOSIS — C50519 Malignant neoplasm of lower-outer quadrant of unspecified female breast: Secondary | ICD-10-CM

## 2012-06-25 DIAGNOSIS — Z5111 Encounter for antineoplastic chemotherapy: Secondary | ICD-10-CM

## 2012-06-25 DIAGNOSIS — Z5112 Encounter for antineoplastic immunotherapy: Secondary | ICD-10-CM

## 2012-06-25 LAB — CBC WITH DIFFERENTIAL/PLATELET
BASO%: 0.8 % (ref 0.0–2.0)
Basophils Absolute: 0 10*3/uL (ref 0.0–0.1)
EOS%: 0.2 % (ref 0.0–7.0)
HGB: 11.5 g/dL — ABNORMAL LOW (ref 11.6–15.9)
MCH: 34.7 pg — ABNORMAL HIGH (ref 25.1–34.0)
MCHC: 32.7 g/dL (ref 31.5–36.0)
RDW: 20.2 % — ABNORMAL HIGH (ref 11.2–14.5)
WBC: 4.7 10*3/uL (ref 3.9–10.3)
lymph#: 2.2 10*3/uL (ref 0.9–3.3)

## 2012-06-25 LAB — COMPREHENSIVE METABOLIC PANEL (CC13)
AST: 30 U/L (ref 5–34)
Albumin: 3.2 g/dL — ABNORMAL LOW (ref 3.5–5.0)
Alkaline Phosphatase: 111 U/L (ref 40–150)
Glucose: 97 mg/dl (ref 70–99)
Potassium: 4.4 mEq/L (ref 3.5–5.1)
Sodium: 141 mEq/L (ref 136–145)
Total Protein: 6.1 g/dL — ABNORMAL LOW (ref 6.4–8.3)

## 2012-06-25 MED ORDER — SODIUM CHLORIDE 0.9 % IV SOLN
800.0000 mg/m2 | Freq: Once | INTRAVENOUS | Status: AC
  Administered 2012-06-25: 1634 mg via INTRAVENOUS
  Filled 2012-06-25: qty 43

## 2012-06-25 MED ORDER — SODIUM CHLORIDE 0.9 % IV SOLN
2.0000 mg/kg | Freq: Once | INTRAVENOUS | Status: DC
  Filled 2012-06-25: qty 9

## 2012-06-25 MED ORDER — OXYCODONE-ACETAMINOPHEN 5-325 MG PO TABS
1.0000 | ORAL_TABLET | Freq: Once | ORAL | Status: AC
  Administered 2012-06-25: 1 via ORAL

## 2012-06-25 MED ORDER — ACETAMINOPHEN 325 MG PO TABS
650.0000 mg | ORAL_TABLET | Freq: Once | ORAL | Status: AC
  Administered 2012-06-25: 650 mg via ORAL

## 2012-06-25 MED ORDER — SODIUM CHLORIDE 0.9 % IV SOLN
300.0000 mg | Freq: Once | INTRAVENOUS | Status: AC
  Administered 2012-06-25: 300 mg via INTRAVENOUS
  Filled 2012-06-25: qty 30

## 2012-06-25 MED ORDER — ONDANSETRON 8 MG/50ML IVPB (CHCC)
8.0000 mg | Freq: Once | INTRAVENOUS | Status: AC
  Administered 2012-06-25: 8 mg via INTRAVENOUS

## 2012-06-25 MED ORDER — DEXAMETHASONE SODIUM PHOSPHATE 10 MG/ML IJ SOLN
10.0000 mg | Freq: Once | INTRAMUSCULAR | Status: AC
  Administered 2012-06-25: 10 mg via INTRAVENOUS

## 2012-06-25 MED ORDER — SODIUM CHLORIDE 0.9 % IJ SOLN
10.0000 mL | INTRAMUSCULAR | Status: DC | PRN
  Administered 2012-06-25: 10 mL
  Filled 2012-06-25: qty 10

## 2012-06-25 MED ORDER — HEPARIN SOD (PORK) LOCK FLUSH 100 UNIT/ML IV SOLN
500.0000 [IU] | Freq: Once | INTRAVENOUS | Status: AC | PRN
  Administered 2012-06-25: 500 [IU]
  Filled 2012-06-25: qty 5

## 2012-06-25 MED ORDER — TRASTUZUMAB CHEMO INJECTION 440 MG
2.0000 mg/kg | Freq: Once | INTRAVENOUS | Status: AC
  Administered 2012-06-25: 189 mg via INTRAVENOUS
  Filled 2012-06-25: qty 9

## 2012-06-25 MED ORDER — DIPHENHYDRAMINE HCL 25 MG PO CAPS
50.0000 mg | ORAL_CAPSULE | Freq: Once | ORAL | Status: AC
  Administered 2012-06-25: 25 mg via ORAL

## 2012-06-25 MED ORDER — SODIUM CHLORIDE 0.9 % IV SOLN
Freq: Once | INTRAVENOUS | Status: AC
  Administered 2012-06-25: 11:00:00 via INTRAVENOUS

## 2012-06-25 NOTE — Patient Instructions (Addendum)
[facility] Discharge Instructions for Patients Receiving Chemotherapy  Today you received the following chemotherapy agents Herceptin/Carboplatin/Gemzar To help prevent nausea and vomiting after your treatment, we encourage you to take your nausea medication as prescribed.   If you develop nausea and vomiting that is not controlled by your nausea medication, call the clinic. If it is after clinic hours your family physician or the after hours number for the clinic or go to the Emergency Department.   BELOW ARE SYMPTOMS THAT SHOULD BE REPORTED IMMEDIATELY:  *FEVER GREATER THAN 100.5 F  *CHILLS WITH OR WITHOUT FEVER  NAUSEA AND VOMITING THAT IS NOT CONTROLLED WITH YOUR NAUSEA MEDICATION  *UNUSUAL SHORTNESS OF BREATH  *UNUSUAL BRUISING OR BLEEDING  TENDERNESS IN MOUTH AND THROAT WITH OR WITHOUT PRESENCE OF ULCERS  *URINARY PROBLEMS  *BOWEL PROBLEMS  UNUSUAL RASH Items with * indicate a potential emergency and should be followed up as soon as possible.  One of the nurses will contact you 24 hours after your treatment. Please let the nurse know about any problems that you may have experienced. Feel free to call the clinic you have any questions or concerns. The clinic phone number is [phone].   I have been informed and understand all the instructions given to me. I know to contact the clinic, my physician, or go to the Emergency Department if any problems should occur. I do not have any questions at this time, but understand that I may call the clinic during office hours   should I have any questions or need assistance in obtaining follow up care.    __________________________________________  _____________  __________ Signature of Patient or Authorized Representative            Date                   Time    __________________________________________ Nurse's Signature     Gemcitabine injection What is this medicine? GEMCITABINE (jem SIT a been) is a  chemotherapy drug. This medicine is used to treat many types of cancer like breast cancer, lung cancer, pancreatic cancer, and ovarian cancer. This medicine may be used for other purposes; ask your health care provider or pharmacist if you have questions. What should I tell my health care provider before I take this medicine? They need to know if you have any of these conditions: -blood disorders -infection -kidney disease -liver disease -recent or ongoing radiation therapy -an unusual or allergic reaction to gemcitabine, other chemotherapy, other medicines, foods, dyes, or preservatives -pregnant or trying to get pregnant -breast-feeding How should I use this medicine? This drug is given as an infusion into a vein. It is administered in a hospital or clinic by a specially trained health care professional. Talk to your pediatrician regarding the use of this medicine in children. Special care may be needed. Overdosage: If you think you have taken too much of this medicine contact a poison control center or emergency room at once. NOTE: This medicine is only for you. Do not share this medicine with others. What if I miss a dose? It is important not to miss your dose. Call your doctor or health care professional if you are unable to keep an appointment. What may interact with this medicine? -medicines to increase blood counts like filgrastim, pegfilgrastim, sargramostim -some other chemotherapy drugs like cisplatin -vaccines Talk to your doctor or health care professional before taking any of these medicines: -acetaminophen -aspirin -ibuprofen -ketoprofen -naproxen This list may not  describe all possible interactions. Give your health care provider a list of all the medicines, herbs, non-prescription drugs, or dietary supplements you use. Also tell them if you smoke, drink alcohol, or use illegal drugs. Some items may interact with your medicine. What should I watch for while using this  medicine? Visit your doctor for checks on your progress. This drug may make you feel generally unwell. This is not uncommon, as chemotherapy can affect healthy cells as well as cancer cells. Report any side effects. Continue your course of treatment even though you feel ill unless your doctor tells you to stop. In some cases, you may be given additional medicines to help with side effects. Follow all directions for their use. Call your doctor or health care professional for advice if you get a fever, chills or sore throat, or other symptoms of a cold or flu. Do not treat yourself. This drug decreases your body's ability to fight infections. Try to avoid being around people who are sick. This medicine may increase your risk to bruise or bleed. Call your doctor or health care professional if you notice any unusual bleeding. Be careful brushing and flossing your teeth or using a toothpick because you may get an infection or bleed more easily. If you have any dental work done, tell your dentist you are receiving this medicine. Avoid taking products that contain aspirin, acetaminophen, ibuprofen, naproxen, or ketoprofen unless instructed by your doctor. These medicines may hide a fever. Women should inform their doctor if they wish to become pregnant or think they might be pregnant. There is a potential for serious side effects to an unborn child. Talk to your health care professional or pharmacist for more information. Do not breast-feed an infant while taking this medicine. What side effects may I notice from receiving this medicine? Side effects that you should report to your doctor or health care professional as soon as possible: -allergic reactions like skin rash, itching or hives, swelling of the face, lips, or tongue -low blood counts - this medicine may decrease the number of white blood cells, red blood cells and platelets. You may be at increased risk for infections and bleeding. -signs of infection  - fever or chills, cough, sore throat, pain or difficulty passing urine -signs of decreased platelets or bleeding - bruising, pinpoint red spots on the skin, black, tarry stools, blood in the urine -signs of decreased red blood cells - unusually weak or tired, fainting spells, lightheadedness -breathing problems -chest pain -mouth sores -nausea and vomiting -pain, swelling, redness at site where injected -pain, tingling, numbness in the hands or feet -stomach pain -swelling of ankles, feet, hands -unusual bleeding Side effects that usually do not require medical attention (report to your doctor or health care professional if they continue or are bothersome): -constipation -diarrhea -hair loss -loss of appetite -stomach upset This list may not describe all possible side effects. Call your doctor for medical advice about side effects. You may report side effects to FDA at 1-800-FDA-1088. Where should I keep my medicine? This drug is given in a hospital or clinic and will not be stored at home. NOTE: This sheet is a summary. It may not cover all possible information. If you have questions about this medicine, talk to your doctor, pharmacist, or health care provider.  2013, [employer]. (11/23/2007 6:45:54 PM)

## 2012-06-25 NOTE — Telephone Encounter (Signed)
Gave patient appointment for 07-05-2012

## 2012-06-25 NOTE — Telephone Encounter (Signed)
chemo/lab /ov 1 week; chemo schedule -weekly with heerceptin  Gave patient appointment for lab md and treatment weekly  Sent michelle email to set up patient's treatment

## 2012-06-25 NOTE — Progress Notes (Signed)
Hematology and Oncology Follow Up Visit  Holly Parrish [id number] 1970-11-26 41 y.o. 06/25/2012 10:15 AM   DIAGNOSIS:   HER-2 positive breast cancer on [facility] chemotherapy due for day 1 cycle 5  PAST THERAPY:  3 cycles of TCH chemotherapy, 1 cycle of weekly carbo/taxol/herceptin  Interim History:  The patient returns for day 1 cycle 5  Of Taxol carboplatin and Herceptin..she missed her chemo last week. Despite that she still h as numbness in both of her hands. hder legs hurt as well. She stuck an 18G needle in her finger last week and didn't  Feel it. She is not working fridays. She feels opk otherwise. NO GI pblms. Medications: I have reviewed the patient's current medications.  Allergies:  Allergies  Allergen Reactions  . Nsaids Anaphylaxis  . Penicillins Anaphylaxis  . Tetanus Toxoids Anaphylaxis  . Hydrogen Peroxide Other (See Comments)    "It burns until I bleed."    Past Medical History, Surgical history, Social history, and Family History were reviewed and updated.  Review of Systems: Constitutional:  Negative for fever, chills, night sweats, anorexia, weight loss, pain. Cardiovascular: negative Respiratory: no cough, shortness of breath, or wheezing negative Neurological: negative Dermatological: negative ENT: negative Skin Gastrointestinal: negative Genito-Urinary: negative Hematological and Lymphatic: negative Breast: negative Musculoskeletal: negative Remaining ROS negative.  Physical Exam:  Blood pressure 110/75, pulse 79, temperature 97.9 F (36.6 C), temperature source Oral, resp. rate 20, height 5\' 2"  (1.575 m), weight 204 lb 11.2 oz (92.851 kg).  ECOG: 0   HEENT:  Sclerae anicteric, conjunctivae pink.  Oropharynx clear.  No mucositis or candidiasis.  Nodes:  No cervical, supraclavicular, or axillary lymphadenopathy palpated.  Breast Exam:  Right breast is benign.  No masses, discharge, skin change, or nipple inversion. The previous documented mass in  the right breast is no longer detectable. I barely feel a abnormal lymph node in the right side.  Left breast is benign.  No masses, discharge, skin change, or nipple inversion..  Lungs:  Clear to auscultation bilaterally.  No crackles, rhonchi, or wheezes.  Heart:  Regular rate and rhythm.  Abdomen:  Soft, nontender.  Positive bowel sounds.  No organomegaly or masses palpated.  Musculoskeletal:  No focal spinal tenderness to palpation.  Extremities:  She has lower extremity edema. There is tenderness in the calf the right versus left. Edema is better .  Skin:  Benign.  Neuro:  Nonfocal.    Lab Results: Lab Results  Component Value Date   WBC 4.7 06/25/2012   HGB 11.5* 06/25/2012   HCT 35.2 06/25/2012   MCV 106.3* 06/25/2012   PLT 372 06/25/2012     Chemistry      Component Value Date/Time   NA 136 05/14/2012 1408   NA 140 05/07/2012 0950   K 4.2 05/14/2012 1408   K 4.3 05/07/2012 0950   CL 103 05/14/2012 1408   CL 108 05/07/2012 0950   CO2 26 05/14/2012 1408   CO2 23 05/07/2012 0950   BUN 12.0 05/14/2012 1408   BUN 16 05/07/2012 0950   CREATININE 0.9 05/14/2012 1408   CREATININE 0.79 05/07/2012 0950      Component Value Date/Time   CALCIUM 9.3 05/14/2012 1408   CALCIUM 9.4 05/07/2012 0950   ALKPHOS 109 05/14/2012 1408   ALKPHOS 73 05/07/2012 0950   AST 30 05/14/2012 1408   AST 18 05/07/2012 0950   ALT 35 05/14/2012 1408   ALT 23 05/07/2012 0950   BILITOT 0.50 05/14/2012 1408   BILITOT  0.4 05/07/2012 0950       Radiological Studies:  No results found.   IMPRESSIONS AND PLAN: A 41 y.o. female with   History of locally advanced HER-2 positive, due for day 1  cycle 5 carboplatin Taxol and Herceptin given weekly.  Given her neuropathy , severity depsite neurontin. I have suggested a switch to carbo/gemzar weekly with herceptin. Hopefully neuropathy will improve. Her schedule will be weekly chemo 3/4 with herceptin only on week 4.    30 minutes spent discussing  these elements. Holly Parrish 11/29/201310:15 AM Cell [phone]

## 2012-06-25 NOTE — Telephone Encounter (Signed)
Per staff message I have scheduled appts. [name]  

## 2012-07-01 ENCOUNTER — Encounter: Payer: Self-pay | Admitting: *Deleted

## 2012-07-02 ENCOUNTER — Other Ambulatory Visit: Payer: Self-pay | Admitting: *Deleted

## 2012-07-02 ENCOUNTER — Ambulatory Visit (HOSPITAL_BASED_OUTPATIENT_CLINIC_OR_DEPARTMENT_OTHER): Payer: Medicare Other

## 2012-07-02 ENCOUNTER — Ambulatory Visit (HOSPITAL_BASED_OUTPATIENT_CLINIC_OR_DEPARTMENT_OTHER): Payer: Medicare Other | Admitting: Oncology

## 2012-07-02 ENCOUNTER — Other Ambulatory Visit (HOSPITAL_BASED_OUTPATIENT_CLINIC_OR_DEPARTMENT_OTHER): Payer: Medicare Other | Admitting: Lab

## 2012-07-02 VITALS — BP 116/76 | HR 73 | Temp 97.9°F | Resp 20 | Ht 62.0 in | Wt 199.9 lb

## 2012-07-02 DIAGNOSIS — C50919 Malignant neoplasm of unspecified site of unspecified female breast: Secondary | ICD-10-CM

## 2012-07-02 DIAGNOSIS — G589 Mononeuropathy, unspecified: Secondary | ICD-10-CM

## 2012-07-02 DIAGNOSIS — Z5111 Encounter for antineoplastic chemotherapy: Secondary | ICD-10-CM

## 2012-07-02 DIAGNOSIS — C50519 Malignant neoplasm of lower-outer quadrant of unspecified female breast: Secondary | ICD-10-CM

## 2012-07-02 DIAGNOSIS — C773 Secondary and unspecified malignant neoplasm of axilla and upper limb lymph nodes: Secondary | ICD-10-CM

## 2012-07-02 DIAGNOSIS — Z5112 Encounter for antineoplastic immunotherapy: Secondary | ICD-10-CM

## 2012-07-02 LAB — CBC WITH DIFFERENTIAL/PLATELET
Basophils Absolute: 0 10*3/uL (ref 0.0–0.1)
EOS%: 0 % (ref 0.0–7.0)
HGB: 10.6 g/dL — ABNORMAL LOW (ref 11.6–15.9)
MCH: 35 pg — ABNORMAL HIGH (ref 25.1–34.0)
NEUT#: 1.4 10*3/uL — ABNORMAL LOW (ref 1.5–6.5)
RDW: 17.5 % — ABNORMAL HIGH (ref 11.2–14.5)
WBC: 4.5 10*3/uL (ref 3.9–10.3)
lymph#: 2.7 10*3/uL (ref 0.9–3.3)

## 2012-07-02 LAB — COMPREHENSIVE METABOLIC PANEL (CC13)
AST: 28 U/L (ref 5–34)
Albumin: 3.2 g/dL — ABNORMAL LOW (ref 3.5–5.0)
Alkaline Phosphatase: 81 U/L (ref 40–150)
Glucose: 82 mg/dl (ref 70–99)
Potassium: 4.1 mEq/L (ref 3.5–5.1)
Sodium: 140 mEq/L (ref 136–145)
Total Protein: 6.1 g/dL — ABNORMAL LOW (ref 6.4–8.3)

## 2012-07-02 MED ORDER — ONDANSETRON 8 MG/50ML IVPB (CHCC)
8.0000 mg | Freq: Once | INTRAVENOUS | Status: AC
  Administered 2012-07-02: 8 mg via INTRAVENOUS

## 2012-07-02 MED ORDER — SODIUM CHLORIDE 0.9 % IJ SOLN
10.0000 mL | INTRAMUSCULAR | Status: DC | PRN
  Administered 2012-07-02: 10 mL
  Filled 2012-07-02: qty 10

## 2012-07-02 MED ORDER — ACETAMINOPHEN 325 MG PO TABS
650.0000 mg | ORAL_TABLET | Freq: Once | ORAL | Status: AC
  Administered 2012-07-02: 650 mg via ORAL

## 2012-07-02 MED ORDER — SODIUM CHLORIDE 0.9 % IV SOLN
800.0000 mg/m2 | Freq: Once | INTRAVENOUS | Status: AC
  Administered 2012-07-02: 1634 mg via INTRAVENOUS
  Filled 2012-07-02: qty 43

## 2012-07-02 MED ORDER — DIPHENHYDRAMINE HCL 25 MG PO CAPS
50.0000 mg | ORAL_CAPSULE | Freq: Once | ORAL | Status: AC
  Administered 2012-07-02: 50 mg via ORAL

## 2012-07-02 MED ORDER — OXYCODONE-ACETAMINOPHEN 10-325 MG PO TABS: 1.0000 | ORAL_TABLET | ORAL | Status: DC | PRN

## 2012-07-02 MED ORDER — TRASTUZUMAB CHEMO INJECTION 440 MG
2.0000 mg/kg | Freq: Once | INTRAVENOUS | Status: AC
  Administered 2012-07-02: 189 mg via INTRAVENOUS
  Filled 2012-07-02: qty 9

## 2012-07-02 MED ORDER — HEPARIN SOD (PORK) LOCK FLUSH 100 UNIT/ML IV SOLN
500.0000 [IU] | Freq: Once | INTRAVENOUS | Status: AC | PRN
  Administered 2012-07-02: 500 [IU]
  Filled 2012-07-02: qty 5

## 2012-07-02 MED ORDER — SODIUM CHLORIDE 0.9 % IV SOLN
Freq: Once | INTRAVENOUS | Status: AC
  Administered 2012-07-02: 13:00:00 via INTRAVENOUS

## 2012-07-02 MED ORDER — LORAZEPAM 0.5 MG PO TABS: 0.5000 mg | ORAL_TABLET | Freq: Four times a day (QID) | ORAL | Status: DC | PRN

## 2012-07-02 MED ORDER — DEXAMETHASONE SODIUM PHOSPHATE 10 MG/ML IJ SOLN
10.0000 mg | Freq: Once | INTRAMUSCULAR | Status: AC
  Administered 2012-07-02: 10 mg via INTRAVENOUS

## 2012-07-02 MED ORDER — SODIUM CHLORIDE 0.9 % IV SOLN
300.0000 mg | Freq: Once | INTRAVENOUS | Status: AC
  Administered 2012-07-02: 300 mg via INTRAVENOUS
  Filled 2012-07-02: qty 30

## 2012-07-02 NOTE — Patient Instructions (Addendum)
[facility] Discharge Instructions for Patients Receiving Chemotherapy  Today you received the following chemotherapy agents Gemzar/Carbo/Herceptin To help prevent nausea and vomiting after your treatment, we encourage you to take your nausea medication as directed .   If you develop nausea and vomiting that is not controlled by your nausea medication, call the clinic. If it is after clinic hours your family physician or the after hours number for the clinic or go to the Emergency Department.   BELOW ARE SYMPTOMS THAT SHOULD BE REPORTED IMMEDIATELY:  *FEVER GREATER THAN 100.5 F  *CHILLS WITH OR WITHOUT FEVER  NAUSEA AND VOMITING THAT IS NOT CONTROLLED WITH YOUR NAUSEA MEDICATION  *UNUSUAL SHORTNESS OF BREATH  *UNUSUAL BRUISING OR BLEEDING  TENDERNESS IN MOUTH AND THROAT WITH OR WITHOUT PRESENCE OF ULCERS  *URINARY PROBLEMS  *BOWEL PROBLEMS  UNUSUAL RASH Items with * indicate a potential emergency and should be followed up as soon as possible.  One of the nurses will contact you 24 hours after your treatment. Please let the nurse know about any problems that you may have experienced. Feel free to call the clinic you have any questions or concerns. The clinic phone number is [phone].   I have been informed and understand all the instructions given to me. I know to contact the clinic, my physician, or go to the Emergency Department if any problems should occur. I do not have any questions at this time, but understand that I may call the clinic during office hours   should I have any questions or need assistance in obtaining follow up care.    __________________________________________  _____________  __________ Signature of Patient or Authorized Representative            Date                   Time    __________________________________________ Nurse's Signature

## 2012-07-02 NOTE — Telephone Encounter (Signed)
lmonvm adviisng the pt of her dec 13th appts

## 2012-07-02 NOTE — Progress Notes (Signed)
Hematology and Oncology Follow Up Visit  Holly Parrish [id number] Sep 21, 1970 41 y.o. 07/02/2012 12:27 PM   DIAGNOSIS:   HER-2 positive breast cancer on [facility] chemotherapy due for day 1 cycle 5  PAST THERAPY:  3 cycles of TCH chemotherapy, 1 cycle of weekly carbo/taxol/herceptin  Interim History:  The patient returns for week 2 of carbo/gem/herceptin. Feelings of numbness are improving. There seems to be subtle improvements in her overall status. No MRI as of yet. She is not working fridays. She feels opk otherwise. NO GI pblms. Medications: I have reviewed the patient's current medications.  Allergies:  Allergies  Allergen Reactions  . Nsaids Anaphylaxis  . Penicillins Anaphylaxis  . Tetanus Toxoids Anaphylaxis  . Hydrogen Peroxide Other (See Comments)    "It burns until I bleed."    Past Medical History, Surgical history, Social history, and Family History were reviewed and updated.  Review of Systems: Constitutional:  Negative for fever, chills, night sweats, anorexia, weight loss, pain. Cardiovascular: negative Respiratory: no cough, shortness of breath, or wheezing negative Neurological: negative Dermatological: negative ENT: negative Skin Gastrointestinal: negative Genito-Urinary: negative Hematological and Lymphatic: negative Breast: negative Musculoskeletal: negative Remaining ROS negative.  Physical Exam:  Blood pressure 116/76, pulse 73, temperature 97.9 F (36.6 C), resp. rate 20, height 5\' 2"  (1.575 m), weight 199 lb 14.4 oz (90.674 kg).  ECOG: 0   HEENT:  Sclerae anicteric, conjunctivae pink.  Oropharynx clear.  No mucositis or candidiasis.  Nodes:  No cervical, supraclavicular, or axillary lymphadenopathy palpated.  Breast Exam:  Right breast is benign.  No masses, discharge, skin change, or nipple inversion. The previous documented mass in the right breast is no longer detectable. I barely feel a abnormal lymph node in the right side.  Left breast is  benign.  No masses, discharge, skin change, or nipple inversion..  Lungs:  Clear to auscultation bilaterally.  No crackles, rhonchi, or wheezes.  Heart:  Regular rate and rhythm.  Abdomen:  Soft, nontender.  Positive bowel sounds.  No organomegaly or masses palpated.  Musculoskeletal:  No focal spinal tenderness to palpation.  Extremities:  She has lower extremity edema. There is tenderness in the calf the right versus left. Edema is better .  Skin:  Benign.  Neuro:  Nonfocal.    Lab Results: Lab Results  Component Value Date   WBC 4.5 07/02/2012   HGB 10.6* 07/02/2012   HCT 32.2* 07/02/2012   MCV 106.3* 07/02/2012   PLT 238 07/02/2012     Chemistry      Component Value Date/Time   NA 141 06/25/2012 0928   NA 140 05/07/2012 0950   K 4.4 06/25/2012 0928   K 4.3 05/07/2012 0950   CL 109* 06/25/2012 0928   CL 108 05/07/2012 0950   CO2 25 06/25/2012 0928   CO2 23 05/07/2012 0950   BUN 13.0 06/25/2012 0928   BUN 16 05/07/2012 0950   CREATININE 0.7 06/25/2012 0928   CREATININE 0.79 05/07/2012 0950      Component Value Date/Time   CALCIUM 9.0 06/25/2012 0928   CALCIUM 9.4 05/07/2012 0950   ALKPHOS 111 06/25/2012 0928   ALKPHOS 73 05/07/2012 0950   AST 30 06/25/2012 0928   AST 18 05/07/2012 0950   ALT 36 06/25/2012 0928   ALT 23 05/07/2012 0950   BILITOT 0.40 06/25/2012 0928   BILITOT 0.4 05/07/2012 0950       Radiological Studies:  No results found.   IMPRESSIONS AND PLAN: A 41 y.o. female with  History of locally advanced HER-2 positive, due for day 1  cycle 5 carboplatin Taxol and Herceptin given weekly.  Given her neuropathy , we have switcdhed to carbo/gem,which I think she tolerates better She will return next week for herceptin alone. Her MRI has been rescheduled.she nis unsure of the date.   30 minutes spent discussing these elements. Timarion Agcaoili 12/6/201312:27 PM Cell [phone]

## 2012-07-05 ENCOUNTER — Other Ambulatory Visit: Payer: Medicare Other | Admitting: Lab

## 2012-07-05 ENCOUNTER — Ambulatory Visit: Payer: Medicare Other | Admitting: Oncology

## 2012-07-06 ENCOUNTER — Other Ambulatory Visit: Payer: Self-pay | Admitting: Certified Registered Nurse Anesthetist

## 2012-07-09 ENCOUNTER — Ambulatory Visit (HOSPITAL_BASED_OUTPATIENT_CLINIC_OR_DEPARTMENT_OTHER): Payer: Medicare Other

## 2012-07-09 ENCOUNTER — Other Ambulatory Visit (HOSPITAL_BASED_OUTPATIENT_CLINIC_OR_DEPARTMENT_OTHER): Payer: Medicare Other | Admitting: Lab

## 2012-07-09 ENCOUNTER — Telehealth: Payer: Self-pay | Admitting: Oncology

## 2012-07-09 ENCOUNTER — Ambulatory Visit (HOSPITAL_BASED_OUTPATIENT_CLINIC_OR_DEPARTMENT_OTHER): Payer: Medicare Other | Admitting: Physician Assistant

## 2012-07-09 ENCOUNTER — Other Ambulatory Visit: Payer: Self-pay | Admitting: *Deleted

## 2012-07-09 ENCOUNTER — Telehealth: Payer: Self-pay | Admitting: *Deleted

## 2012-07-09 VITALS — BP 108/76 | HR 88 | Temp 98.5°F | Resp 20 | Ht 62.0 in | Wt 201.5 lb

## 2012-07-09 DIAGNOSIS — G609 Hereditary and idiopathic neuropathy, unspecified: Secondary | ICD-10-CM

## 2012-07-09 DIAGNOSIS — Z170 Estrogen receptor positive status [ER+]: Secondary | ICD-10-CM

## 2012-07-09 DIAGNOSIS — C50519 Malignant neoplasm of lower-outer quadrant of unspecified female breast: Secondary | ICD-10-CM

## 2012-07-09 DIAGNOSIS — Z5112 Encounter for antineoplastic immunotherapy: Secondary | ICD-10-CM

## 2012-07-09 DIAGNOSIS — D696 Thrombocytopenia, unspecified: Secondary | ICD-10-CM

## 2012-07-09 LAB — CBC WITH DIFFERENTIAL/PLATELET
BASO%: 0.3 % (ref 0.0–2.0)
MCHC: 33.1 g/dL (ref 31.5–36.0)
MONO#: 0 10*3/uL — ABNORMAL LOW (ref 0.1–0.9)
RBC: 2.74 10*6/uL — ABNORMAL LOW (ref 3.70–5.45)
WBC: 3.2 10*3/uL — ABNORMAL LOW (ref 3.9–10.3)
lymph#: 1.7 10*3/uL (ref 0.9–3.3)
nRBC: 0 % (ref 0–0)

## 2012-07-09 LAB — COMPREHENSIVE METABOLIC PANEL (CC13)
ALT: 47 U/L (ref 0–55)
AST: 47 U/L — ABNORMAL HIGH (ref 5–34)
Alkaline Phosphatase: 81 U/L (ref 40–150)
Chloride: 107 mEq/L (ref 98–107)
Creatinine: 0.7 mg/dL (ref 0.6–1.1)
Total Bilirubin: 0.39 mg/dL (ref 0.20–1.20)

## 2012-07-09 LAB — TECHNOLOGIST REVIEW

## 2012-07-09 MED ORDER — PROMETHAZINE HCL 25 MG PO TABS: ORAL_TABLET | ORAL | Status: DC

## 2012-07-09 MED ORDER — SODIUM CHLORIDE 0.9 % IV SOLN
Freq: Once | INTRAVENOUS | Status: AC
  Administered 2012-07-09: 13:00:00 via INTRAVENOUS

## 2012-07-09 MED ORDER — HEPARIN SOD (PORK) LOCK FLUSH 100 UNIT/ML IV SOLN
500.0000 [IU] | Freq: Once | INTRAVENOUS | Status: AC | PRN
  Administered 2012-07-09: 500 [IU]
  Filled 2012-07-09: qty 5

## 2012-07-09 MED ORDER — DIPHENHYDRAMINE HCL 25 MG PO CAPS
50.0000 mg | ORAL_CAPSULE | Freq: Once | ORAL | Status: AC
  Administered 2012-07-09: 25 mg via ORAL

## 2012-07-09 MED ORDER — SODIUM CHLORIDE 0.9 % IJ SOLN
10.0000 mL | INTRAMUSCULAR | Status: DC | PRN
  Administered 2012-07-09: 10 mL
  Filled 2012-07-09: qty 10

## 2012-07-09 MED ORDER — TRASTUZUMAB CHEMO INJECTION 440 MG
2.0000 mg/kg | Freq: Once | INTRAVENOUS | Status: AC
  Administered 2012-07-09: 189 mg via INTRAVENOUS
  Filled 2012-07-09: qty 9

## 2012-07-09 MED ORDER — ACETAMINOPHEN 325 MG PO TABS
650.0000 mg | ORAL_TABLET | Freq: Once | ORAL | Status: AC
  Administered 2012-07-09: 650 mg via ORAL

## 2012-07-09 NOTE — Progress Notes (Signed)
IDMelody Parrish   DOB: 03/08/1971  MR#: [id number]  WUJ#:[id number]  PCP: No primary provider on file. GYN:  SU:  OTHER MD:   HISTORY OF PRESENT ILLNESS: Holly Parrish noted a mass in her right breast about 5 months ago. She did not have any healthcare an insurance and so delayed getting this looked after. She ultimately underwent a mammogram on 02/16/2012. There showed a firm palpable mass right breast 8:00 position measuring 4 cm there was an additional 3 cm lower right axillary lymph node. Ultrasound confirmed the presence of this mass in the breast measuring 2.7 x 2.7 x 2.9 cm. There were 2 large right lower right axillary lymph nodes largest one measuring 4 cm a smaller one measuring 2 x 3.1 cm. She underwent biopsies of both these areas. 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 This is also the ER and PR positive.  STAGE:  Cancer of lower-outer quadrant of female breast  Primary site: Breast (Right)  Staging method: AJCC 7th Edition  Clinical: Stage IIB (T2, N1, cM0)  Summary: Stage IIB (T2, N1, cM0)  Remainder history as noted below.  INTERVAL HISTORY: Holly Parrish  returns today for followup of her locally advanced right breast carcinoma.  She's currently day 15 cycle 1 of carboplatin/gemcitabine with trastuzumab. Recall that [facility] had 3 cycles of q. three-week TC with weekly trastuzumab. This was then switched to weekly carboplatin/paclitaxel/trastuzumab which she received for one cycle in early November. She initiated treatment with carboplatin/and cytidine with trastuzumab on November 29, the plan being to treat with all 3 agents on days 1, 8, and 15, with trastuzumab alone given on day 21 of each 28 day cycle.  She is very tearful on presentation today and tells me she "feels terrible. She's had significant leg pain over the past week. She's having increased numbness and tingling which she tells me is "all over" her body. She tells me that even when  I touch her back, she feels the pressure, but no actual sensation. He does seem to have worsened over the last couple of weeks.  Next is also having increased nausea for which she has been utilizing ondansetron. She's having associated headaches. She has constipation with subsequent rectal bleeding associated with hemorrhoids.   REVIEW OF SYSTEMS: Fortunately, Holly Parrish has had no fevers or chills. She's had no rashes or skin changes. She has no increased shortness of breath or cough and denies any chest pain. No dizziness or change in vision. No peripheral swelling.  She is having some increased vaginal dryness (she has not had a menstrual cycle since beginning chemotherapy) which is causing general discomfort. She denies any signs of abnormal bleeding other than the slight rectal bleeding as noted above.  A detailed review of systems is otherwise stable and noncontributory.   PAST MEDICAL HISTORY: Past Medical History  Diagnosis Date  . Lupus   . Joint pain   . Hearing loss   . Bleeding nose   . Bilateral swelling of feet   . Breast cancer 40  . Cancer 37    Cervical, treated with cryoablation  . Migraines   . Headache     PAST SURGICAL HISTORY: Past Surgical History  Procedure Date  . Appendectomy   . Tonsillectomy   . Cholecystectomy   . Tubal ligation   . 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 Portacath placement 03/11/2012    Procedure: INSERTION PORT-A-CATH;  Surgeon: Emelia Loron, MD;  Location:  [facility];  Service: General;  Laterality: Left;    FAMILY HISTORY Family History  Problem Relation Age of Onset  . Uterine cancer Mother 76  . Pancreatic cancer Paternal Uncle     diagnosed in his 69s; smoker; half uncle related through grandmother  . Liver cancer Maternal Grandmother     not a drinker  . Breast cancer Cousin     3 paternal cousins with breast cancer, onset 37, 36 and one bilateral at  12 and 66  . Heart attack Father 62  . Breast  cancer Paternal Grandmother 46  . Heart attack Paternal Grandfather   . Throat cancer Paternal Aunt     smoker; half uncle related through grandmother  . Cystic fibrosis Cousin     GYNECOLOGIC HISTORY: G5 P5,  one daughter died at age 59, menarche at age 89; age of first live birth is 10; no recent history of birth control pill use; has normal menses.  SOCIAL HISTORY: From [place] where most of her family still resides.  Works as a [employer] farm manager.  She has been married twice and recently has been divorced.  She has four children who live in [place].    ADVANCED DIRECTIVES:  HEALTH MAINTENANCE: History  Substance Use Topics  . Smoking status: Never Smoker   . Smokeless tobacco: Not on file  . Alcohol Use: Yes     Comment: occasional drinker     Colonoscopy:  PAP:  Bone density:  Lipid panel:  Allergies  Allergen Reactions  . Nsaids Anaphylaxis  . Penicillins Anaphylaxis  . Tetanus Toxoids Anaphylaxis  . Hydrogen Peroxide Other (See Comments)    "It burns until I bleed."    Current Outpatient Prescriptions  Medication Sig Dispense Refill  . aspirin-acetaminophen-caffeine (EXCEDRIN MIGRAINE) 250-250-65 MG per tablet Take 1 tablet by mouth every 6 (six) hours as needed.      Marland Kitchen dexamethasone (DECADRON) 4 MG tablet TAKE 2 TABLETS BY MOUTH 2 TIMES A DAY THE DAY BEFORE TAXOTERE-THEN TAKE 2TABS 2 TIMES A DAY STARTING DAY AFTER CHEMO FOR 3DAYS  30 tablet  2  . fluconazole (DIFLUCAN) 100 MG tablet Take 1 tablet (100 mg total) by mouth daily.  14 tablet  3  . furosemide (LASIX) 20 MG tablet Take 1 tablet (20 mg total) by mouth 2 (two) times daily.  30 tablet  4  . gabapentin (NEURONTIN) 300 MG capsule Take 1 capsule (300 mg total) by mouth 3 (three) times daily.  90 capsule  4  . lidocaine-prilocaine (EMLA) cream Apply topically as needed.  30 g  0  . LORazepam (ATIVAN) 0.5 MG tablet Take 1 tablet (0.5 mg total) by mouth every 6 (six) hours as needed for anxiety.  30 tablet  1   . ondansetron (ZOFRAN) 8 MG tablet TAKE 1 TABLET BY MOUTH 2 TIMES A DAY STARTING THE DAY AFTER CHEMO FOR 3DAYS-THEN TAKE 1TAB 2XDAY AS NEEDED FOR NAUSEA OR VOMITING  30 tablet  1  . oxyCODONE-acetaminophen (PERCOCET) 10-325 MG per tablet Take 1 tablet by mouth every 4 (four) hours as needed for pain.  60 tablet  0  . potassium chloride (K-DUR) 10 MEQ tablet Take 2 tablets (20 mEq total) by mouth once.  30 tablet  4  . promethazine (PHENERGAN) 25 MG tablet Take 1 tablet by mouth every 6 to 8 hrs as needed for nausea or vomiting  30 tablet  0  . traMADol (ULTRAM) 50 MG tablet Take 1 tablet (50 mg total)  by mouth every 6 (six) hours as needed for pain.  30 tablet  3    OBJECTIVE: Young white female who appears tired and anxious Filed Vitals:   07/09/12 1156  BP: 108/76  Pulse: 88  Temp: 98.5 F (36.9 C)  Resp: 20     Body mass index is 36.85 kg/(m^2).    ECOG FS: 2 Filed Weights   07/09/12 1156  Weight: 201 lb 8 oz (91.4 kg)    Sclerae unicteric Oropharynx clear No cervical or supraclavicular adenopathy Lungs no rales or rhonchi Heart regular rate and rhythm Abdomen soft, nontender, with positive bowel sounds MSK no focal spinal tenderness, no peripheral edema Neuro: nonfocal, alert and oriented x3 Breasts: Deferred. Axillae are benign bilaterally, with no adenopathy palpated.   LAB RESULTS: Lab Results  Component Value Date   WBC 3.2* 07/09/2012   NEUTROABS 1.4* 07/09/2012   HGB 9.6* 07/09/2012   HCT 29.0* 07/09/2012   MCV 105.8* 07/09/2012   PLT 42* 07/09/2012      Chemistry      Component Value Date/Time   NA 141 07/09/2012 1140   NA 140 05/07/2012 0950   K 4.0 07/09/2012 1140   K 4.3 05/07/2012 0950   CL 107 07/09/2012 1140   CL 108 05/07/2012 0950   CO2 27 07/09/2012 1140   CO2 23 05/07/2012 0950   BUN 14.0 07/09/2012 1140   BUN 16 05/07/2012 0950   CREATININE 0.7 07/09/2012 1140   CREATININE 0.79 05/07/2012 0950      Component Value Date/Time   CALCIUM  8.7 07/09/2012 1140   CALCIUM 9.4 05/07/2012 0950   ALKPHOS 81 07/09/2012 1140   ALKPHOS 73 05/07/2012 0950   AST 47* 07/09/2012 1140   AST 18 05/07/2012 0950   ALT 47 07/09/2012 1140   ALT 23 05/07/2012 0950   BILITOT 0.39 07/09/2012 1140   BILITOT 0.4 05/07/2012 0950       Lab Results  Component Value Date   LABCA2 11 05/28/2012     STUDIES: No results found.    ASSESSMENT: 41 y.o.    PLAN: I spent a little over one hour with this patient and her husband today, the majority of which was spent counseling her regarding her multiple concerns. With her thrombocytopenia today, we're holding both carboplatin and gemcitabine and will treat with trastuzumab alone today.  She will review her treatment plan with Dr. Donnie Coffin when she sees him again next week on December 20. Depending on his plan for treatment, she will also need to be scheduled for a repeat breast MRI to assess response, and we'll need to see her surgeon, Dr. Dwain Sarna, to discuss definitive surgery.  She'll continue with her gabapentin at night, and we'll also add a vitamin B complex daily for the peripheral neuropathy. She will continue with her oxycodone/APAP for the pain in her lower extremities. She will keep an elevated when possible. She will call this pain worsens.  We also reviewed a bowel regimen to include the stool softeners and MiraLAX as needed, the goal being to maintain regular bowel movements at least every other day. She's given this information in writing. We'll also prescribe Phenergan, 25 mg by mouth every 6-8 hours for nausea. Recall that she tolerated the suppositories well with regards to the medication itself, but takes using suppositories and would prefer oral medication. If she needs to take the ondansetron, she'll take it with a low dose of Benadryl to help with the headaches. She is intolerant of  both Compazine and metoclopramide and this has been noted in her chart accordingly.  Holly Parrish voices  understanding and agreement with our plan, and will call if she has any changes or problems prior to her appointment here next week.   Noam Karaffa    07/09/2012

## 2012-07-09 NOTE — Telephone Encounter (Signed)
Per staff message and POF I have scheduled appts.  [name]  

## 2012-07-09 NOTE — Patient Instructions (Addendum)
[facility] Discharge Instructions for Patients Receiving Chemotherapy  Today you received the following chemotherapy agents Herceptin.  To help prevent nausea and vomiting after your treatment, we encourage you to take your nausea medication as ordered per MD.    If you develop nausea and vomiting that is not controlled by your nausea medication, call the clinic. If it is after clinic hours your family physician or the after hours number for the clinic or go to the Emergency Department.   BELOW ARE SYMPTOMS THAT SHOULD BE REPORTED IMMEDIATELY:  *FEVER GREATER THAN 100.5 F  *CHILLS WITH OR WITHOUT FEVER  NAUSEA AND VOMITING THAT IS NOT CONTROLLED WITH YOUR NAUSEA MEDICATION  *UNUSUAL SHORTNESS OF BREATH  *UNUSUAL BRUISING OR BLEEDING  TENDERNESS IN MOUTH AND THROAT WITH OR WITHOUT PRESENCE OF ULCERS  *URINARY PROBLEMS  *BOWEL PROBLEMS  UNUSUAL RASH Items with * indicate a potential emergency and should be followed up as soon as possible.  . Please let the nurse know about any problems that you may have experienced. Feel free to call the clinic you have any questions or concerns. The clinic phone number is [phone].   I have been informed and understand all the instructions given to me. I know to contact the clinic, my physician, or go to the Emergency Department if any problems should occur. I do not have any questions at this time, but understand that I may call the clinic during office hours   should I have any questions or need assistance in obtaining follow up care.    __________________________________________  _____________  __________ Signature of Patient or Authorized Representative            Date                   Time    __________________________________________ Nurse's Signature    

## 2012-07-09 NOTE — Telephone Encounter (Signed)
gve the pt her dec and her jan 2014 appt calendar. Sent michelle a staff message to add the chemo appt. Pt is aware of the process

## 2012-07-13 ENCOUNTER — Encounter: Payer: Self-pay | Admitting: Oncology

## 2012-07-13 NOTE — Progress Notes (Signed)
Patient came in with rental agreement.She wants rent paid. I gave her w-9 for landlord to fill out and get back. I advised her of 2-3 weeks for processing so need to get back asap. Darlena gave her a Personal assistant. She doesn't have anything. She became upset. She has Art gallery manager funds for rent available.

## 2012-07-16 ENCOUNTER — Ambulatory Visit: Payer: Medicare Other | Admitting: Oncology

## 2012-07-16 ENCOUNTER — Ambulatory Visit: Payer: Medicare Other

## 2012-07-16 ENCOUNTER — Other Ambulatory Visit (HOSPITAL_BASED_OUTPATIENT_CLINIC_OR_DEPARTMENT_OTHER): Payer: Medicare Other | Admitting: Lab

## 2012-07-16 ENCOUNTER — Other Ambulatory Visit: Payer: Self-pay | Admitting: *Deleted

## 2012-07-16 ENCOUNTER — Ambulatory Visit (HOSPITAL_COMMUNITY): Payer: Medicare Other | Attending: Oncology

## 2012-07-16 VITALS — BP 114/83 | HR 87 | Temp 98.4°F | Resp 20 | Ht 62.0 in | Wt 204.5 lb

## 2012-07-16 DIAGNOSIS — C50519 Malignant neoplasm of lower-outer quadrant of unspecified female breast: Secondary | ICD-10-CM

## 2012-07-16 DIAGNOSIS — C50919 Malignant neoplasm of unspecified site of unspecified female breast: Secondary | ICD-10-CM

## 2012-07-16 LAB — CBC WITH DIFFERENTIAL/PLATELET
Eosinophils Absolute: 0 10*3/uL (ref 0.0–0.5)
LYMPH%: 56.6 % — ABNORMAL HIGH (ref 14.0–49.7)
MCH: 35.1 pg — ABNORMAL HIGH (ref 25.1–34.0)
MCV: 109.6 fL — ABNORMAL HIGH (ref 79.5–101.0)
MONO%: 20.4 % — ABNORMAL HIGH (ref 0.0–14.0)
NEUT#: 0.7 10*3/uL — ABNORMAL LOW (ref 1.5–6.5)
Platelets: 207 10*3/uL (ref 145–400)
RBC: 2.71 10*6/uL — ABNORMAL LOW (ref 3.70–5.45)
nRBC: 2 % — ABNORMAL HIGH (ref 0–0)

## 2012-07-16 MED ORDER — OXYCODONE-ACETAMINOPHEN 10-325 MG PO TABS: 1.0000 | ORAL_TABLET | ORAL | Status: DC | PRN

## 2012-07-17 ENCOUNTER — Telehealth: Payer: Self-pay | Admitting: *Deleted

## 2012-07-17 NOTE — Telephone Encounter (Signed)
Left message for pt to return my call on Monday concerning her upcoming appt.

## 2012-07-19 ENCOUNTER — Telehealth: Payer: Self-pay | Admitting: *Deleted

## 2012-07-19 NOTE — Telephone Encounter (Signed)
Gave patient appointment for 07-26-2012  

## 2012-07-23 ENCOUNTER — Telehealth: Payer: Self-pay | Admitting: Oncology

## 2012-07-23 ENCOUNTER — Ambulatory Visit: Payer: Medicare Other

## 2012-07-23 ENCOUNTER — Other Ambulatory Visit: Payer: Medicare Other | Admitting: Lab

## 2012-07-23 NOTE — Telephone Encounter (Signed)
I called and left the patient a message.   She did not show for her chemo appt and Dr. Donnie Coffin had referred her for a doppler last Friday.  I left her my phone number and asked her to return my call.

## 2012-07-26 ENCOUNTER — Ambulatory Visit: Payer: Medicare Other | Admitting: Oncology

## 2012-07-26 ENCOUNTER — Other Ambulatory Visit: Payer: Medicare Other | Admitting: Lab

## 2012-07-29 ENCOUNTER — Telehealth: Payer: Self-pay | Admitting: Oncology

## 2012-07-29 ENCOUNTER — Encounter: Payer: Self-pay | Admitting: Oncology

## 2012-07-29 NOTE — Progress Notes (Signed)
Patient's landlord Dwan Bolt called and left a message to call her back at [phone]. She said patient had told her of funds to pay her rent. I called and left the patient a message to call me back and let me know when she could come by here for w-9 for Ms. Cox to fill out so her rent can get paid. Patient is not due back till 08/06/12. I advised on recrder to drop by and get w-9 so landlord can get filled out and returned to get her rent paid. She does have 600.00- Alight fund available.

## 2012-07-29 NOTE — Telephone Encounter (Signed)
Pt has not come in for several appts nor has she returned any phone calls.   I left another voicemail.

## 2012-07-29 NOTE — Telephone Encounter (Signed)
I left a message for the doctor at [facility] requesting a call back.

## 2012-07-29 NOTE — Telephone Encounter (Signed)
Dr. Geralynn Ochs returned my call and left a message- so I returned his call.   He has not spoken to the patient since Tuesday- for work.  She is scheduled to come back to work today.  I asked him to please have her call us-because we are very worried about her.

## 2012-07-29 NOTE — Telephone Encounter (Signed)
I left a message for the patient's emergency contact stating we have not been able to connect with the patient.

## 2012-07-30 ENCOUNTER — Other Ambulatory Visit: Payer: Medicare Other | Admitting: Lab

## 2012-07-30 ENCOUNTER — Ambulatory Visit: Payer: Medicare Other | Admitting: Oncology

## 2012-07-30 ENCOUNTER — Ambulatory Visit: Payer: Medicare Other

## 2012-08-02 ENCOUNTER — Telehealth: Payer: Self-pay | Admitting: *Deleted

## 2012-08-02 ENCOUNTER — Encounter: Payer: Self-pay | Admitting: Oncology

## 2012-08-02 NOTE — Progress Notes (Signed)
Spoke with patient's landlord  Holly Parrish at [phone] and she is going to mail the w-9 back to me. I gave her the address and I advised her of about 2-3 weeks for process of getting rent check to her. [name] said that was fine. I told her I would call [name] and let her know. I called [name] and left on her voicemail I had spoke with Ms. Cox and gave her address to mail the w-9 form back to me.

## 2012-08-02 NOTE — Telephone Encounter (Signed)
Per patient reassignment I have moved appt to later for 1/10.  [name]

## 2012-08-03 ENCOUNTER — Telehealth: Payer: Self-pay | Admitting: *Deleted

## 2012-08-03 NOTE — Telephone Encounter (Signed)
Left vm with date and time for new appt for lab/md/chemo.  Encourage pt to call back to confirm appt.  Contact information given.

## 2012-08-04 ENCOUNTER — Encounter: Payer: Self-pay | Admitting: Oncology

## 2012-08-04 ENCOUNTER — Encounter: Payer: Self-pay | Admitting: *Deleted

## 2012-08-04 ENCOUNTER — Other Ambulatory Visit: Payer: Self-pay | Admitting: *Deleted

## 2012-08-04 DIAGNOSIS — C50519 Malignant neoplasm of lower-outer quadrant of unspecified female breast: Secondary | ICD-10-CM

## 2012-08-04 DIAGNOSIS — C50919 Malignant neoplasm of unspecified site of unspecified female breast: Secondary | ICD-10-CM

## 2012-08-04 MED ORDER — OXYCODONE-ACETAMINOPHEN 10-325 MG PO TABS: 1.0000 | ORAL_TABLET | ORAL | Status: DC | PRN

## 2012-08-04 NOTE — Progress Notes (Signed)
Called and left patient a message to call me back. I received the w-9 from her landlord and I need to see how much of the Alight she wants to use. She does have 600.00 and we can pay the 450.00 for last month and 150.00 toward this month. I have to advise her that all of Alight will be gone. I need to see if she has tried for Medicaid again. I emailed Christine per Darlena to see if she can try for [facility] again since her medicare is termed. I will forward request to pay rent when she returns my call.

## 2012-08-04 NOTE — Telephone Encounter (Signed)
Reports leaving voice mail on 08/03/12 requesting refill on her pain medication. Is completely out now. Is on her way to pick up the script and asks for nurse to please have it ready for her.

## 2012-08-06 ENCOUNTER — Telehealth (INDEPENDENT_AMBULATORY_CARE_PROVIDER_SITE_OTHER): Payer: Self-pay

## 2012-08-06 ENCOUNTER — Ambulatory Visit: Payer: Medicare Other

## 2012-08-06 ENCOUNTER — Other Ambulatory Visit (HOSPITAL_BASED_OUTPATIENT_CLINIC_OR_DEPARTMENT_OTHER): Payer: Medicare Other | Admitting: Lab

## 2012-08-06 ENCOUNTER — Encounter: Payer: Self-pay | Admitting: Physician Assistant

## 2012-08-06 ENCOUNTER — Ambulatory Visit (HOSPITAL_BASED_OUTPATIENT_CLINIC_OR_DEPARTMENT_OTHER): Payer: Medicare Other | Admitting: Physician Assistant

## 2012-08-06 VITALS — BP 113/72 | HR 81 | Temp 98.1°F | Resp 20 | Ht 62.0 in | Wt 198.7 lb

## 2012-08-06 DIAGNOSIS — G629 Polyneuropathy, unspecified: Secondary | ICD-10-CM

## 2012-08-06 DIAGNOSIS — F32A Depression, unspecified: Secondary | ICD-10-CM | POA: Insufficient documentation

## 2012-08-06 DIAGNOSIS — C773 Secondary and unspecified malignant neoplasm of axilla and upper limb lymph nodes: Secondary | ICD-10-CM

## 2012-08-06 DIAGNOSIS — C50519 Malignant neoplasm of lower-outer quadrant of unspecified female breast: Secondary | ICD-10-CM

## 2012-08-06 DIAGNOSIS — F341 Dysthymic disorder: Secondary | ICD-10-CM

## 2012-08-06 DIAGNOSIS — G609 Hereditary and idiopathic neuropathy, unspecified: Secondary | ICD-10-CM

## 2012-08-06 DIAGNOSIS — F329 Major depressive disorder, single episode, unspecified: Secondary | ICD-10-CM | POA: Insufficient documentation

## 2012-08-06 DIAGNOSIS — C50919 Malignant neoplasm of unspecified site of unspecified female breast: Secondary | ICD-10-CM

## 2012-08-06 LAB — CBC WITH DIFFERENTIAL/PLATELET
BASO%: 1 % (ref 0.0–2.0)
EOS%: 0.5 % (ref 0.0–7.0)
HCT: 38.4 % (ref 34.8–46.6)
LYMPH%: 54.3 % — ABNORMAL HIGH (ref 14.0–49.7)
MCH: 36 pg — ABNORMAL HIGH (ref 25.1–34.0)
MCHC: 32.8 g/dL (ref 31.5–36.0)
NEUT%: 33.5 % — ABNORMAL LOW (ref 38.4–76.8)
Platelets: 527 10*3/uL — ABNORMAL HIGH (ref 145–400)
lymph#: 3.2 10*3/uL (ref 0.9–3.3)

## 2012-08-06 MED ORDER — SERTRALINE HCL 50 MG PO TABS: 50.0000 mg | ORAL_TABLET | Freq: Every day | ORAL | Status: DC

## 2012-08-06 MED ORDER — OXYCODONE HCL 5 MG PO TABS: 10.0000 mg | ORAL_TABLET | Freq: Every day | ORAL | Status: DC

## 2012-08-06 NOTE — Telephone Encounter (Signed)
[facility] At [facility] called asking for appt with Dr Dwain Sarna before 08-17-12 so pt can have mri and  surgical plan discussed. I advised her I will send request to Dr Dwain Sarna and Elease Hashimoto to review schedule. Dewayne Hatch can be reached at [phone].

## 2012-08-08 NOTE — Progress Notes (Signed)
IDMelody Parrish   DOB: 10-02-1970  MR#: [id number]  HYQ#:[id number]  PCP: No primary provider on file. GYN:  SU:  OTHER MD:   HISTORY OF PRESENT ILLNESS: Holly Parrish noted a mass in her right breast about 5 months ago. She did not have any healthcare an insurance and so delayed getting this looked after. She ultimately underwent a mammogram on 02/16/2012. There showed a firm palpable mass right breast 8:00 position measuring 4 cm there was an additional 3 cm lower right axillary lymph node. Ultrasound confirmed the presence of this mass in the breast measuring 2.7 x 2.7 x 2.9 cm. There were 2 large right lower right axillary lymph nodes largest one measuring 4 cm a smaller one measuring 2 x 3.1 cm. She underwent biopsies of both these areas. 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 This is also the ER and PR positive.  STAGE:  Cancer of lower-outer quadrant of female breast  Primary site: Breast (Right)  Staging method: AJCC 7th Edition  Clinical: Stage IIB (T2, N1, cM0)  Summary: Stage IIB (T2, N1, cM0)  Remainder history as noted below.  INTERVAL HISTORY: Holly Parrish  returns today for followup of her locally advanced right breast carcinoma.  Her last dose of carboplatin/gemcitabine with trastuzumab was given in early December and has been held since that time due to poor tolerance and a worsening of peripheral neuropathy.  Most recent dose of trastuzumab was given 07/09/12.  She continues to be very anxious and tearful on presentation.  She continues to feel poorly physically, is depressed and anxious, and also has multiple stressors at home with her family and her financial situation.  Holly Parrish continues to have achy legs.  Her feet and ankles swell intermittently, but this improves with rest and elevation.  Neuropathy with associated pain and discomfort in the feet and hands continues to be Holly Parrish's greatest complaint.  This has not improved.  She takes  gabapentin 300 mg in the afternoon and at bedtime.  It makes her too dizzy to take before work.     REVIEW OF SYSTEMS: Fortunately, Holly Parrish has had no fevers or chills. She's had no rashes or skin changes. She has no increased shortness of breath, orthopnea, or PND and denies any chest pain. She complains of a dry cough.  No dizziness or change in vision. She is having increased vaginal dryness (she has not had a menstrual cycle since beginning chemotherapy) which is causing general discomfort. She denies any signs of abnormal bleeding.  She has had no problems with nausea since stopping the chemotherapy and is having regular bowel movements with the use of Colace and Miralax.  A detailed review of systems is otherwise stable and noncontributory.   PAST MEDICAL HISTORY: Past Medical History  Diagnosis Date  . Lupus   . Joint pain   . Hearing loss   . Bleeding nose   . Bilateral swelling of feet   . Breast cancer 40  . Cancer 37    Cervical, treated with cryoablation  . Migraines   . Headache     PAST SURGICAL HISTORY: Past Surgical History  Procedure Date  . Appendectomy   . Tonsillectomy   . Cholecystectomy   . Tubal ligation   . 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 Portacath placement 03/11/2012    Procedure: INSERTION PORT-A-CATH;  Surgeon: Emelia Loron, MD;  Location: [facility];  Service: General;  Laterality: Left;  FAMILY HISTORY Family History  Problem Relation Age of Onset  . Uterine cancer Mother 28  . Pancreatic cancer Paternal Uncle     diagnosed in his 66s; smoker; half uncle related through grandmother  . Liver cancer Maternal Grandmother     not a drinker  . Breast cancer Cousin     3 paternal cousins with breast cancer, onset 87, 63 and one bilateral at  42 and 73  . Heart attack Father 34  . Breast cancer Paternal Grandmother 5  . Heart attack Paternal Grandfather   . Throat cancer Paternal Aunt     smoker; half uncle related  through grandmother  . Cystic fibrosis Cousin     GYNECOLOGIC HISTORY: G5 P5,  one daughter died at age 67, menarche at age 76; age of first live birth is 65; no recent history of birth control pill use; has normal menses.  SOCIAL HISTORY: From [place] where most of her family still resides.  Works as a [employer] farm manager.  She has been married twice and recently has been divorced.  She has four children who live in [place].    ADVANCED DIRECTIVES:  HEALTH MAINTENANCE: History  Substance Use Topics  . Smoking status: Never Smoker   . Smokeless tobacco: Not on file  . Alcohol Use: Yes     Comment: occasional drinker     Colonoscopy:  PAP:  Bone density:  Lipid panel:  Allergies  Allergen Reactions  . Nsaids Anaphylaxis  . Penicillins Anaphylaxis  . Tetanus Toxoids Anaphylaxis  . Hydrogen Peroxide Other (See Comments)    "It burns until I bleed."    Current Outpatient Prescriptions  Medication Sig Dispense Refill  . aspirin-acetaminophen-caffeine (EXCEDRIN MIGRAINE) 250-250-65 MG per tablet Take 1 tablet by mouth every 6 (six) hours as needed.      Marland Kitchen dexamethasone (DECADRON) 4 MG tablet TAKE 2 TABLETS BY MOUTH 2 TIMES A DAY THE DAY BEFORE TAXOTERE-THEN TAKE 2TABS 2 TIMES A DAY STARTING DAY AFTER CHEMO FOR 3DAYS  30 tablet  2  . fluconazole (DIFLUCAN) 100 MG tablet Take 1 tablet (100 mg total) by mouth daily.  14 tablet  3  . furosemide (LASIX) 20 MG tablet Take 1 tablet (20 mg total) by mouth 2 (two) times daily.  30 tablet  4  . gabapentin (NEURONTIN) 300 MG capsule Take 1 capsule (300 mg total) by mouth 3 (three) times daily.  90 capsule  4  . lidocaine-prilocaine (EMLA) cream Apply topically as needed.  30 g  0  . LORazepam (ATIVAN) 0.5 MG tablet Take 1 tablet (0.5 mg total) by mouth every 6 (six) hours as needed for anxiety.  30 tablet  1  . ondansetron (ZOFRAN) 8 MG tablet TAKE 1 TABLET BY MOUTH 2 TIMES A DAY STARTING THE DAY AFTER CHEMO FOR 3DAYS-THEN TAKE 1TAB 2XDAY  AS NEEDED FOR NAUSEA OR VOMITING  30 tablet  1  . oxyCODONE (OXY IR/ROXICODONE) 5 MG immediate release tablet Take 2 tablets (10 mg total) by mouth daily with breakfast. As needed for pain  60 tablet  0  . potassium chloride (K-DUR) 10 MEQ tablet Take 2 tablets (20 mEq total) by mouth once.  30 tablet  4  . promethazine (PHENERGAN) 25 MG tablet Take 1 tablet by mouth every 6 to 8 hrs as needed for nausea or vomiting  30 tablet  0  . sertraline (ZOLOFT) 50 MG tablet Take 1 tablet (50 mg total) by mouth daily.  30 tablet  2  .  traMADol (ULTRAM) 50 MG tablet Take 1 tablet (50 mg total) by mouth every 6 (six) hours as needed for pain.  30 tablet  3    OBJECTIVE: Young white female who appears tired and anxious Filed Vitals:   08/06/12 1416  BP: 113/72  Pulse: 81  Temp: 98.1 F (36.7 C)  Resp: 20     Body mass index is 36.34 kg/(m^2).    ECOG FS: 2 Filed Weights   08/06/12 1416  Weight: 198 lb 11.2 oz (90.13 kg)    Sclerae unicteric Oropharynx clear No cervical or supraclavicular adenopathy Lungs no rales or rhonchi Heart regular rate and rhythm Abdomen soft, nontender, with positive bowel sounds MSK no focal spinal tenderness, 1+ pitting edema bilaterally in the lower extremities, equal bilaterally.  No erythema or palpable cords. Neuro: nonfocal, alert and oriented x3 Breasts: Unable to palpate distinct mass in right breast although there is some notable thickening in the lateral portion of the breast.  Left breast is unremarkable.   Port is intact in the left upper chest wall.   LAB RESULTS: Lab Results  Component Value Date   WBC 5.8 08/06/2012   NEUTROABS 2.0 08/06/2012   HGB 12.6 08/06/2012   HCT 38.4 08/06/2012   MCV 109.7* 08/06/2012   PLT 527* 08/06/2012      Chemistry      Component Value Date/Time   NA 141 07/09/2012 1140   NA 140 05/07/2012 0950   K 4.0 07/09/2012 1140   K 4.3 05/07/2012 0950   CL 107 07/09/2012 1140   CL 108 05/07/2012 0950   CO2 27 07/09/2012  1140   CO2 23 05/07/2012 0950   BUN 14.0 07/09/2012 1140   BUN 16 05/07/2012 0950   CREATININE 0.7 07/09/2012 1140   CREATININE 0.79 05/07/2012 0950      Component Value Date/Time   CALCIUM 8.7 07/09/2012 1140   CALCIUM 9.4 05/07/2012 0950   ALKPHOS 81 07/09/2012 1140   ALKPHOS 73 05/07/2012 0950   AST 47* 07/09/2012 1140   AST 18 05/07/2012 0950   ALT 47 07/09/2012 1140   ALT 23 05/07/2012 0950   BILITOT 0.39 07/09/2012 1140   BILITOT 0.4 05/07/2012 0950       Lab Results  Component Value Date   LABCA2 11 05/28/2012     STUDIES: No results found.    ASSESSMENT: 42 y.o.   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  Palpable right axillary adenopathy at presentation.  (2)  Being treated in the neoadjuvant setting.  Status post:  (a)  3 cycles of Taxotere/carbo with Herceptin, with poor tolerance  (b)  One cycle of weekly Carbo/Taxol and Herceptin in early November 2013, with poor tolerance  (c)  2 doses of weekly Carbo/Gemzar with Herceptin with poor tolerance and worsening neuropathy.  Last dose of chemo given in early December 2013.  (3)  Herceptin to be continued for a total of one year.  (4)  Patient will need definitive surgery, followed by radiation, then anti-estrogen therapy.  PLAN: This case was reviewed with Dr. Darnelle Catalan who also spoke extensively with the patient today.  Over half of her one hour appointment was spent counseling the patient regarding her diagnosis and her treatment plan from this point forward.  She will not receive additional neoadjuvant chemotherapy at this time secondary to her poor tolerance and  progressing neuropathy.  She is being scheduled for a repeat breast MRI to assess response to therapy, and will see Dr. Dwain Sarna soon thereafter to discuss definitive surgery.  She is also due for a repeat  echocardiogram and will see Dr. Gala Romney to review those results, hopefully within the next couple of weeks.  She will then see me briefly for re-evaluation in anticipation of restarting trastuzumab.  Of course we plan to continue the trastuzumab every 3 weeks for a total of 1 year.    Inetha will continue on gabapentin, but we are going to increase her dose to 600mg  at bedtime for neuropathy and sleeplessness.  She will take oxycodone, two 5 mg tablets each morning before work to control her pain, and will take tramadol for breakthrough pain throughout the day.  She has not tolerated Cymbalta or Effexor in the past, but agrees to try a low dose Zoloft of 50 mg daily for depression and anxiety.    Our social worker, Tamala Julian, is also speaking with the patient today with regards to resources for emotional support and financial support.  Holly Parrish voices understanding and agreement with our plan, and will call if she has any changes or problems prior to her appointment here next week.   Holly Parrish    08/08/2012

## 2012-08-09 ENCOUNTER — Telehealth (INDEPENDENT_AMBULATORY_CARE_PROVIDER_SITE_OTHER): Payer: Self-pay

## 2012-08-09 NOTE — Telephone Encounter (Signed)
LMOM for pt to call me so I can give her the appt with Dr Dwain Sarna.

## 2012-08-09 NOTE — Telephone Encounter (Signed)
LMOM for Holly Parrish to call me b/c I have made an appt for the pt on 1/17 arrive at 10:15 for 10:30.

## 2012-08-10 ENCOUNTER — Telehealth (INDEPENDENT_AMBULATORY_CARE_PROVIDER_SITE_OTHER): Payer: Self-pay

## 2012-08-10 ENCOUNTER — Encounter: Payer: Self-pay | Admitting: Oncology

## 2012-08-10 NOTE — Telephone Encounter (Signed)
LMOM again stating that I am trying to give her an appt for Friday 08/13/12 with Dr Dwain Sarna for her to call me back.

## 2012-08-10 NOTE — Progress Notes (Signed)
Left a message for patient to call me again. I need to know how she wants to get rent paid.Marland Kitchenusing all of 600.00 or just 450.00 for December. I also need to have her call Martie Lee and try and get her B[facility]P again.

## 2012-08-10 NOTE — Telephone Encounter (Signed)
Pt returned my call and she has been made aware of her appt for 08/13/12 with Dr Dwain Sarna.

## 2012-08-11 ENCOUNTER — Encounter: Payer: Self-pay | Admitting: Oncology

## 2012-08-11 NOTE — Progress Notes (Signed)
Patient called back and advised me that she wants 450.00 paid for her December rent. I will forward to [facility] for processing.

## 2012-08-13 ENCOUNTER — Encounter (INDEPENDENT_AMBULATORY_CARE_PROVIDER_SITE_OTHER): Payer: Self-pay | Admitting: General Surgery

## 2012-08-13 ENCOUNTER — Telehealth: Payer: Self-pay | Admitting: Oncology

## 2012-08-13 ENCOUNTER — Ambulatory Visit: Payer: Medicare Other

## 2012-08-13 ENCOUNTER — Other Ambulatory Visit (HOSPITAL_BASED_OUTPATIENT_CLINIC_OR_DEPARTMENT_OTHER): Payer: Medicare Other | Admitting: Lab

## 2012-08-13 ENCOUNTER — Other Ambulatory Visit: Payer: Self-pay | Admitting: Physician Assistant

## 2012-08-13 ENCOUNTER — Ambulatory Visit (INDEPENDENT_AMBULATORY_CARE_PROVIDER_SITE_OTHER): Payer: Medicare Other | Admitting: General Surgery

## 2012-08-13 ENCOUNTER — Ambulatory Visit (HOSPITAL_COMMUNITY)
Admission: RE | Admit: 2012-08-13 | Discharge: 2012-08-13 | Disposition: A | Discharge status: Home / Self Care | Payer: Medicare Other | Source: Ambulatory Visit | Attending: Physician Assistant | Admitting: Physician Assistant

## 2012-08-13 VITALS — BP 108/74 | HR 60 | Temp 96.9°F | Resp 18 | Ht 63.0 in | Wt 202.6 lb

## 2012-08-13 DIAGNOSIS — C50519 Malignant neoplasm of lower-outer quadrant of unspecified female breast: Secondary | ICD-10-CM

## 2012-08-13 DIAGNOSIS — R599 Enlarged lymph nodes, unspecified: Secondary | ICD-10-CM | POA: Insufficient documentation

## 2012-08-13 DIAGNOSIS — C50919 Malignant neoplasm of unspecified site of unspecified female breast: Secondary | ICD-10-CM

## 2012-08-13 DIAGNOSIS — C773 Secondary and unspecified malignant neoplasm of axilla and upper limb lymph nodes: Secondary | ICD-10-CM | POA: Insufficient documentation

## 2012-08-13 LAB — COMPREHENSIVE METABOLIC PANEL (CC13)
ALT: 25 U/L (ref 0–55)
Alkaline Phosphatase: 90 U/L (ref 40–150)
Creatinine: 0.9 mg/dL (ref 0.6–1.1)
Sodium: 141 mEq/L (ref 136–145)
Total Bilirubin: 0.31 mg/dL (ref 0.20–1.20)
Total Protein: 7 g/dL (ref 6.4–8.3)

## 2012-08-13 LAB — CBC WITH DIFFERENTIAL/PLATELET
BASO%: 1.4 % (ref 0.0–2.0)
EOS%: 1.4 % (ref 0.0–7.0)
HCT: 35.7 % (ref 34.8–46.6)
LYMPH%: 50.3 % — ABNORMAL HIGH (ref 14.0–49.7)
MCH: 37 pg — ABNORMAL HIGH (ref 25.1–34.0)
MCHC: 33.6 g/dL (ref 31.5–36.0)
MCV: 110.2 fL — ABNORMAL HIGH (ref 79.5–101.0)
MONO%: 15.5 % — ABNORMAL HIGH (ref 0.0–14.0)
NEUT%: 31.4 % — ABNORMAL LOW (ref 38.4–76.8)
Platelets: 462 10*3/uL — ABNORMAL HIGH (ref 145–400)
RBC: 3.24 10*6/uL — ABNORMAL LOW (ref 3.70–5.45)
WBC: 4.1 10*3/uL (ref 3.9–10.3)

## 2012-08-13 MED ORDER — GADOBENATE DIMEGLUMINE 529 MG/ML IV SOLN
20.0000 mL | Freq: Once | INTRAVENOUS | Status: AC | PRN
  Administered 2012-08-13: 18 mL via INTRAVENOUS

## 2012-08-13 NOTE — Telephone Encounter (Signed)
lmonvm for pt confirming 1/21 appt echo/dr bensimhon and 1/24 lb/fu/tx. Pt has already had appt w/dr. Dwain Sarna - lb - mri (all today).

## 2012-08-15 NOTE — Progress Notes (Signed)
Patient ID: Holly Parrish, female   DOB: 27-Jul-1971, 42 y.o.   MRN: [id number]  Chief Complaint  Patient presents with  . New Evaluation    est new br ca    HPI Holly Parrish is a 42 y.o. female.   HPI 2 yof who was diagnosed with locally advanced right breast cancer.  Upon presentation she was noted to have a large right breast mass with multiple nodes.  This underwent biopsy with finding of IDC that is her2 positive.  The node was also positive. She was begun on chemotherapy and she has had a lot of trouble.  Mostly this is with significant peripheral neuropathy of her hands/feet.  According to the last note she now has had chemo stopped and is recommended to proceed with surgery.  She reports after second cycle the mass in the breast disappeared.  She does state she has some soreness in the right axilla.  She comes in today to discuss her surgical options.  She is due to get an mri later today.  Past Medical History  Diagnosis Date  . Lupus   . Joint pain   . Hearing loss   . Bleeding nose   . Bilateral swelling of feet   . Breast cancer 40  . Cancer 37    Cervical, treated with cryoablation  . Migraines   . Headache   . Asthma   . Neuromuscular disorder     Past Surgical History  Procedure Date  . Tonsillectomy   . Cholecystectomy   . 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 Portacath placement 03/11/2012    Procedure: INSERTION PORT-A-CATH;  Surgeon: Emelia Loron, MD;  Location: [facility];  Service: General;  Laterality: Left;  . Hand surgery 1997  . Appendectomy 1995  . Abdominal exploration surgery [id number]  . Tubal ligation 1997    Family History  Problem Relation Age of Onset  . Uterine cancer Mother 68  . Pancreatic cancer Paternal Uncle     diagnosed in his 78s; smoker; half uncle related through grandmother  . Cancer Paternal Uncle     Throat  . Liver cancer Maternal Grandmother     not a drinker  . Cancer Maternal Grandmother   . Breast  cancer Cousin     3 paternal cousins with breast cancer, onset 5, 54 and one bilateral at  58 and 68  . Cancer Cousin     Breast  . Heart attack Father 17  . Breast cancer Paternal Grandmother 71  . Heart attack Paternal Grandfather   . Throat cancer Paternal Aunt     smoker; half uncle related through grandmother  . Cystic fibrosis Cousin   . Cancer Cousin     Breast  . Cancer Cousin     Breast    Social History History  Substance Use Topics  . Smoking status: Never Smoker   . Smokeless tobacco: Never Used  . Alcohol Use: Yes     Comment: occasional drinker    Allergies  Allergen Reactions  . Nsaids Anaphylaxis  . Penicillins Anaphylaxis  . Tetanus Toxoids Anaphylaxis  . Hydrogen Peroxide Other (See Comments)    "It burns until I bleed."    Current Outpatient Prescriptions  Medication Sig Dispense Refill  . dexamethasone (DECADRON) 4 MG tablet TAKE 2 TABLETS BY MOUTH 2 TIMES A DAY THE DAY BEFORE TAXOTERE-THEN TAKE 2TABS 2 TIMES A DAY STARTING DAY AFTER CHEMO FOR 3DAYS  30 tablet  2  .  fluconazole (DIFLUCAN) 100 MG tablet Take 1 tablet (100 mg total) by mouth daily.  14 tablet  3  . furosemide (LASIX) 20 MG tablet Take 1 tablet (20 mg total) by mouth 2 (two) times daily.  30 tablet  4  . gabapentin (NEURONTIN) 300 MG capsule Take 1 capsule (300 mg total) by mouth 3 (three) times daily.  90 capsule  4  . lidocaine-prilocaine (EMLA) cream Apply topically as needed.  30 g  0  . LORazepam (ATIVAN) 0.5 MG tablet Take 1 tablet (0.5 mg total) by mouth every 6 (six) hours as needed for anxiety.  30 tablet  1  . ondansetron (ZOFRAN) 8 MG tablet TAKE 1 TABLET BY MOUTH 2 TIMES A DAY STARTING THE DAY AFTER CHEMO FOR 3DAYS-THEN TAKE 1TAB 2XDAY AS NEEDED FOR NAUSEA OR VOMITING  30 tablet  1  . oxyCODONE (OXY IR/ROXICODONE) 5 MG immediate release tablet Take 2 tablets (10 mg total) by mouth daily with breakfast. As needed for pain  60 tablet  0  . potassium chloride (K-DUR) 10 MEQ  tablet Take 2 tablets (20 mEq total) by mouth once.  30 tablet  4  . promethazine (PHENERGAN) 25 MG tablet Take 1 tablet by mouth every 6 to 8 hrs as needed for nausea or vomiting  30 tablet  0  . sertraline (ZOLOFT) 50 MG tablet Take 1 tablet (50 mg total) by mouth daily.  30 tablet  2  . traMADol (ULTRAM) 50 MG tablet Take 1 tablet (50 mg total) by mouth every 6 (six) hours as needed for pain.  30 tablet  3    Review of Systems Review of Systems  Blood pressure 108/74, pulse 60, temperature 96.9 F (36.1 C), temperature source Temporal, resp. rate 18, height 5\' 3"  (1.6 m), weight 202 lb 9.6 oz (91.899 kg), last menstrual period 02/07/2012.  Physical Exam Physical Exam  Vitals reviewed. Constitutional: She appears well-developed.  Cardiovascular: Normal rate, regular rhythm and normal heart sounds.   Pulmonary/Chest: Effort normal and breath sounds normal. She has no wheezes. She has no rales. Right breast exhibits no inverted nipple, no mass, no nipple discharge, no skin change and no tenderness. Left breast exhibits no inverted nipple, no mass, no nipple discharge, no skin change and no tenderness. Breasts are symmetrical.  Abdominal: Soft.  Lymphadenopathy:    She has no cervical adenopathy.    She has axillary adenopathy.       Right axillary: Lateral adenopathy present. No pectoral adenopathy present.       Left axillary: No pectoral and no lateral adenopathy present.      Right: No supraclavicular adenopathy present.       Left: No supraclavicular adenopathy present.    Data Reviewed Prior notes, mri, path  Assessment    Locally advanced right breast cancer    Plan    We discussed possible options and she may end up being candidate for bct.  She will need alnd at time of surgery.  She will get mr and then I will contact her after that.       Holly Parrish 08/15/2012, 9:29 PM

## 2012-08-16 ENCOUNTER — Telehealth: Payer: Self-pay | Admitting: Oncology

## 2012-08-16 ENCOUNTER — Other Ambulatory Visit: Payer: Self-pay | Admitting: *Deleted

## 2012-08-16 DIAGNOSIS — C50519 Malignant neoplasm of lower-outer quadrant of unspecified female breast: Secondary | ICD-10-CM

## 2012-08-16 NOTE — Telephone Encounter (Signed)
S/w the pt and she is aware of her appts on 08/26/2012.

## 2012-08-17 ENCOUNTER — Encounter (HOSPITAL_COMMUNITY): Payer: Self-pay

## 2012-08-17 ENCOUNTER — Ambulatory Visit (HOSPITAL_COMMUNITY)
Admission: RE | Admit: 2012-08-17 | Discharge: 2012-08-17 | Disposition: A | Discharge status: Home / Self Care | Payer: Medicare Other | Source: Ambulatory Visit | Attending: Internal Medicine | Admitting: Internal Medicine

## 2012-08-17 ENCOUNTER — Ambulatory Visit (HOSPITAL_BASED_OUTPATIENT_CLINIC_OR_DEPARTMENT_OTHER)
Admission: RE | Admit: 2012-08-17 | Discharge: 2012-08-17 | Disposition: A | Discharge status: Home / Self Care | Payer: Medicare Other | Source: Ambulatory Visit | Attending: Internal Medicine | Admitting: Internal Medicine

## 2012-08-17 VITALS — BP 110/80 | HR 88 | Wt 203.1 lb

## 2012-08-17 DIAGNOSIS — Z8249 Family history of ischemic heart disease and other diseases of the circulatory system: Secondary | ICD-10-CM | POA: Insufficient documentation

## 2012-08-17 DIAGNOSIS — C50919 Malignant neoplasm of unspecified site of unspecified female breast: Secondary | ICD-10-CM | POA: Insufficient documentation

## 2012-08-17 DIAGNOSIS — C50519 Malignant neoplasm of lower-outer quadrant of unspecified female breast: Secondary | ICD-10-CM

## 2012-08-17 DIAGNOSIS — Z09 Encounter for follow-up examination after completed treatment for conditions other than malignant neoplasm: Secondary | ICD-10-CM

## 2012-08-17 DIAGNOSIS — J45909 Unspecified asthma, uncomplicated: Secondary | ICD-10-CM | POA: Insufficient documentation

## 2012-08-17 DIAGNOSIS — M329 Systemic lupus erythematosus, unspecified: Secondary | ICD-10-CM

## 2012-08-17 NOTE — Patient Instructions (Addendum)
Follow up in 3 months with an ECHO  We will refer you to rheumatologist.

## 2012-08-17 NOTE — Assessment & Plan Note (Addendum)
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 However she also reports a rheumatologic history. Will check ANA. Refer to rheumatology.

## 2012-08-17 NOTE — Assessment & Plan Note (Addendum)
Explained the purpose of the HF clinic as it relates to breast cancer treatment.  Explained that ~10% of women develop cardiotoxicity while on Herceptin. ECHO reviewed and discussed during office visit. No evidence of cardiotoxcity. Continue Herceptin. We will follow every 3 months with an ECHO.

## 2012-08-17 NOTE — Progress Notes (Signed)
  Echocardiogram 2D Echocardiogram limited has been performed.  Cathie Beams 08/17/2012, 12:14 PM

## 2012-08-18 LAB — ANA: Anti Nuclear Antibody(ANA): NEGATIVE

## 2012-08-18 NOTE — Progress Notes (Signed)
Patient ID: Holly Parrish, female   DOB: [birth year], 42 y.o.   MRN: [id number] General Surgeon: Dr Dwain Sarna PCP: None Oncologist: Dr Darnelle Catalan  HPI: Holly Parrish is a 42 y.o. 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 Status post: (a) 3 cycles of Taxotere/carbo with Herceptin with poor tolerance,  one cycle of weekly Carbo/Taxol and Herceptin in early November 2013, with poor tolerance, and 2 doses of weekly Carbo/Gemzar with Herceptin with poor tolerance and worsening neuropathy. Last dose of chemo given in early December 2013. Plans to continue Herceptin until August 2014.   03/12/12 EF 55-60%  08/17/12 EF 55% lateral S' 8.7.poor window  She is referred to HF clinic by Dr Darnelle Catalan. Complains of feet and joint pain. Complains of fatigue.  Denies SOB/PND/Orthopnea.  She currently is not followed by rheumatologist or PCP. Denies SOB/PND/Orthopnea. Works part time as a Engineer, mining.  Compliant with medications. She is taking lasix once a week.    Review of Systems:     Cardiac Review of Systems: {Y] = yes [ ]  = no  Chest Pain [    ]  Resting SOB [   ] Exertional SOB  [  ]  Orthopnea [  ]   Pedal Edema [   ]    Palpitations [  ] Syncope  [  ]   Presyncope [   ]  General Review of Systems: [Y] = yes [  ]=no Constitional: recent weight change [  ]; anorexia [  ]; fatigue [ Y  ]; nausea [  ]; night sweats [  ]; fever [  ]; or chills [  ];                                                                                                                    Eye : blurred vision [  ]; diplopia [   ]; vision changes [  ];  Amaurosis fugax[  ]; Resp: cough [  ];  wheezing[  ];  hemoptysis[  ]; shortness of breath[  ]; paroxysmal nocturnal dyspnea[  ]; dyspnea on exertion[  ]; or orthopnea[  ];  GI:   gallstones[  ], vomiting[  ];  dysphagia[  ]; melena[  ];  hematochezia [  ]; heartburn[  ];   Hx of  Colonoscopy[  ]; GU: kidney stones [  ]; hematuria[  ];   dysuria [  ];  nocturia[  ];  history of     obstruction [  ];                 Skin: rash, swelling[  ];, hair loss[  ];  peripheral edema[  ];  or itching[  ]; Musculosketetal: myalgias[Y  ];  joint swelling[Y  ];  joint erythema[  ];  joint pain[Y ];  back pain[  ];  Heme/Lymph: bruising[  ];  bleeding[  ];  anemia[  ];  Neuro: TIA[  ];  headaches[  ];  stroke[  ];  vertigo[  ];  seizures[  ];   paresthesias[  ];  difficulty walking[ Y ];  Psych:depression[  ]; anxiety[  ];  Endocrine: diabetes[  ];  thyroid dysfunction[  ];  Other:    Past Medical History  Diagnosis Date  . Lupus   . Joint pain   . Hearing loss   . Bleeding nose   . Bilateral swelling of feet   . Breast cancer 40  . Cancer 37    Cervical, treated with cryoablation  . Migraines   . Headache   . Asthma   . Neuromuscular disorder     Current Outpatient Prescriptions  Medication Sig Dispense Refill  . furosemide (LASIX) 20 MG tablet Take 20 mg by mouth as needed.      . gabapentin (NEURONTIN) 300 MG capsule Take 600 mg by mouth daily.      Marland Kitchen lidocaine-prilocaine (EMLA) cream Apply topically as needed.  30 g  0  . LORazepam (ATIVAN) 0.5 MG tablet Take 1 tablet (0.5 mg total) by mouth every 6 (six) hours as needed for anxiety.  30 tablet  1  . ondansetron (ZOFRAN) 8 MG tablet TAKE 1 TABLET BY MOUTH 2 TIMES A DAY STARTING THE DAY AFTER CHEMO FOR 3DAYS-THEN TAKE 1TAB 2XDAY AS NEEDED FOR NAUSEA OR VOMITING  30 tablet  1  . oxyCODONE (OXY IR/ROXICODONE) 5 MG immediate release tablet Take 2 tablets (10 mg total) by mouth daily with breakfast. As needed for pain  60 tablet  0  . potassium chloride (K-DUR) 10 MEQ tablet Take 20 mEq by mouth once. Only when taking lasix      . promethazine (PHENERGAN) 25 MG tablet Take 1 tablet by mouth every 6 to 8 hrs as needed for  nausea or vomiting  30 tablet  0  . sertraline (ZOLOFT) 50 MG tablet Take 1 tablet (50 mg total) by mouth daily.  30 tablet  2  . traMADol (ULTRAM) 50 MG tablet Take 1 tablet (50 mg total) by mouth every 6 (six) hours as needed for pain.  30 tablet  3  . dexamethasone (DECADRON) 4 MG tablet TAKE 2 TABLETS BY MOUTH 2 TIMES A DAY THE DAY BEFORE TAXOTERE-THEN TAKE 2TABS 2 TIMES A DAY STARTING DAY AFTER CHEMO FOR 3DAYS  30 tablet  2     Allergies  Allergen Reactions  . Nsaids Anaphylaxis  . Penicillins Anaphylaxis  . Tetanus Toxoids Anaphylaxis  . Hydrogen Peroxide Other (See Comments)    "It burns until I bleed."    History   Social History  . Marital Status: Divorced    Spouse Name: N/A    Number of Children: N/A  . Years of Education: N/A   Occupational History  . Not on file.   Social History Main Topics  . Smoking status: Never Smoker   . Smokeless tobacco: Never Used  . Alcohol Use: Yes     Comment: occasional drinker  . Drug Use: No  . Sexually Active:    Other Topics Concern  . Not on file   Social History Narrative  . No narrative on file    Family History  Problem Relation Age of Onset  . Uterine cancer Mother 18  . Pancreatic cancer Paternal Uncle     diagnosed in his 22s; smoker; half uncle  related through grandmother  . Cancer Paternal Uncle     Throat  . Liver cancer Maternal Grandmother     not a drinker  . Cancer Maternal Grandmother   . Breast cancer Cousin     3 paternal cousins with breast cancer, onset 53, 38 and one bilateral at  38 and 70  . Cancer Cousin     Breast  . Heart attack Father 51  . Breast cancer Paternal Grandmother 4  . Heart attack Paternal Grandfather   . Throat cancer Paternal Aunt     smoker; half uncle related through grandmother  . Cystic fibrosis Cousin   . Cancer Cousin     Breast  . Cancer Cousin     Breast    PHYSICAL EXAM: Filed Vitals:   08/17/12 1214  BP: 110/80  Pulse: 88   General:  Chronically  ill appearing. No respiratory difficulty HEENT: normal Neck: supple. no JVD. Carotids 2+ bilat; no bruits. No lymphadenopathy or thryomegaly appreciated. Cor: PMI nondisplaced. Regular rate & rhythm. No rubs, gallops or murmurs. Lungs: clear Abdomen: soft, nontender, nondistended. No hepatosplenomegaly. No bruits or masses. Good bowel sounds. Extremities: no cyanosis, clubbing, rash, edema Neuro: Alert & oriented x 3, cranial nerves grossly intact. moves all 4 extremities w/o difficulty. Affect pleasant.

## 2012-08-20 ENCOUNTER — Other Ambulatory Visit: Payer: Medicare Other | Admitting: Lab

## 2012-08-20 ENCOUNTER — Ambulatory Visit: Payer: Medicare Other | Admitting: Physician Assistant

## 2012-08-20 ENCOUNTER — Ambulatory Visit: Payer: Medicare Other | Admitting: Oncology

## 2012-08-20 ENCOUNTER — Ambulatory Visit: Payer: Medicare Other

## 2012-08-23 NOTE — Progress Notes (Signed)
[facility] today.  Letter mailed to patient.  

## 2012-08-26 ENCOUNTER — Telehealth: Payer: Self-pay | Admitting: *Deleted

## 2012-08-26 ENCOUNTER — Other Ambulatory Visit: Payer: Medicare Other | Admitting: Lab

## 2012-08-26 ENCOUNTER — Telehealth: Payer: Self-pay | Admitting: Oncology

## 2012-08-26 ENCOUNTER — Ambulatory Visit: Payer: Medicare Other | Admitting: Physician Assistant

## 2012-08-26 NOTE — Telephone Encounter (Signed)
S/w AAB re pt calling to cx lb/fu today - pt has tx tomorrow. Per AB she will take a look at this and get back to me. Returned pt's call and lm that we will contact her w/new appt.

## 2012-08-26 NOTE — Telephone Encounter (Signed)
Sent Holly Parrish a staff message to add a chemo appt on for 09/17/2012.

## 2012-08-26 NOTE — Telephone Encounter (Signed)
Per staff message and POF I have scheduled appts.  [name]  

## 2012-08-26 NOTE — Progress Notes (Signed)
The patient contacted scheduling this morning with a need to cancel her appointments for today, January 30. Her lab and trastuzumab infusion will be rescheduled to tomorrow, January 31. Unfortunately we will not be in the office tomorrow, so I will plan on seeing her back in 3 weeks, February 21, when she returns for her next dose of trastuzumab.  In the meanwhile, the patient continues to be followed by her surgeon, and they are in the process of scheduling her for definitive breast surgery.   Zollie Scale, PA-C 08/26/2012

## 2012-08-26 NOTE — Telephone Encounter (Signed)
lmonvm regarding the pt's appts for 08/27/2012 and for her to pick up an appt calendar for feb.

## 2012-08-27 ENCOUNTER — Telehealth (INDEPENDENT_AMBULATORY_CARE_PROVIDER_SITE_OTHER): Payer: Self-pay

## 2012-08-27 ENCOUNTER — Other Ambulatory Visit: Payer: Medicare Other | Admitting: Lab

## 2012-08-27 ENCOUNTER — Ambulatory Visit: Payer: Medicare Other

## 2012-08-27 ENCOUNTER — Ambulatory Visit: Payer: Medicare Other | Admitting: Oncology

## 2012-08-27 NOTE — Telephone Encounter (Signed)
Pt called requesting mri result and treatment plan. Pt advised I will send request to [facility] and Dr Dwain Sarna. Pt can be reached at [phone].

## 2012-08-30 ENCOUNTER — Ambulatory Visit (HOSPITAL_BASED_OUTPATIENT_CLINIC_OR_DEPARTMENT_OTHER): Payer: Medicare Other

## 2012-08-30 ENCOUNTER — Telehealth (INDEPENDENT_AMBULATORY_CARE_PROVIDER_SITE_OTHER): Payer: Self-pay

## 2012-08-30 ENCOUNTER — Other Ambulatory Visit: Payer: Self-pay | Admitting: Physician Assistant

## 2012-08-30 ENCOUNTER — Other Ambulatory Visit (HOSPITAL_BASED_OUTPATIENT_CLINIC_OR_DEPARTMENT_OTHER): Payer: Medicare Other | Admitting: Lab

## 2012-08-30 VITALS — BP 125/68 | HR 71 | Temp 97.6°F

## 2012-08-30 DIAGNOSIS — C50519 Malignant neoplasm of lower-outer quadrant of unspecified female breast: Secondary | ICD-10-CM

## 2012-08-30 DIAGNOSIS — G629 Polyneuropathy, unspecified: Secondary | ICD-10-CM

## 2012-08-30 DIAGNOSIS — Z5112 Encounter for antineoplastic immunotherapy: Secondary | ICD-10-CM

## 2012-08-30 DIAGNOSIS — C773 Secondary and unspecified malignant neoplasm of axilla and upper limb lymph nodes: Secondary | ICD-10-CM

## 2012-08-30 LAB — COMPREHENSIVE METABOLIC PANEL (CC13)
ALT: 14 U/L (ref 0–55)
AST: 20 U/L (ref 5–34)
Albumin: 3.5 g/dL (ref 3.5–5.0)
BUN: 15.7 mg/dL (ref 7.0–26.0)
Calcium: 9.6 mg/dL (ref 8.4–10.4)
Chloride: 106 mEq/L (ref 98–107)
Potassium: 3.8 mEq/L (ref 3.5–5.1)
Total Protein: 7.3 g/dL (ref 6.4–8.3)

## 2012-08-30 LAB — CBC WITH DIFFERENTIAL/PLATELET
BASO%: 0.5 % (ref 0.0–2.0)
HCT: 42.1 % (ref 34.8–46.6)
HGB: 14 g/dL (ref 11.6–15.9)
MCHC: 33.3 g/dL (ref 31.5–36.0)
MONO#: 0.5 10*3/uL (ref 0.1–0.9)
NEUT%: 59.8 % (ref 38.4–76.8)
WBC: 8 10*3/uL (ref 3.9–10.3)
lymph#: 2.6 10*3/uL (ref 0.9–3.3)

## 2012-08-30 MED ORDER — OXYCODONE-ACETAMINOPHEN 5-325 MG PO TABS: ORAL_TABLET | ORAL | Status: DC

## 2012-08-30 MED ORDER — ACETAMINOPHEN 325 MG PO TABS
650.0000 mg | ORAL_TABLET | Freq: Once | ORAL | Status: AC
  Administered 2012-08-30: 325 mg via ORAL

## 2012-08-30 MED ORDER — HEPARIN SOD (PORK) LOCK FLUSH 100 UNIT/ML IV SOLN
500.0000 [IU] | Freq: Once | INTRAVENOUS | Status: AC | PRN
  Administered 2012-08-30: 500 [IU]
  Filled 2012-08-30: qty 5

## 2012-08-30 MED ORDER — TRASTUZUMAB CHEMO INJECTION 440 MG
6.0000 mg/kg | Freq: Once | INTRAVENOUS | Status: AC
  Administered 2012-08-30: 546 mg via INTRAVENOUS
  Filled 2012-08-30: qty 26

## 2012-08-30 MED ORDER — SODIUM CHLORIDE 0.9 % IV SOLN
Freq: Once | INTRAVENOUS | Status: AC
  Administered 2012-08-30: 14:00:00 via INTRAVENOUS

## 2012-08-30 MED ORDER — DIPHENHYDRAMINE HCL 25 MG PO CAPS
50.0000 mg | ORAL_CAPSULE | Freq: Once | ORAL | Status: AC
  Administered 2012-08-30: 25 mg via ORAL

## 2012-08-30 MED ORDER — SODIUM CHLORIDE 0.9 % IJ SOLN
10.0000 mL | INTRAMUSCULAR | Status: DC | PRN
  Administered 2012-08-30: 10 mL
  Filled 2012-08-30: qty 10

## 2012-08-30 NOTE — Telephone Encounter (Signed)
Pt returned my call. The pt can come in tomorrow as long as the appt is after 1:00 b/c of her work scheduled which answered my question about which day she wanted for this week. It was going to be either Tuesday or Thursday. We scheduled the pt for 2/4 at 2:30 b/c Thursday was going to be at 1:00. The pt is ok with the appt.

## 2012-08-30 NOTE — Telephone Encounter (Signed)
Her mr looks much better.  I think it is best for me to talk with her in person about options.  Could she come in tomorrow before gregory?

## 2012-08-30 NOTE — Telephone Encounter (Signed)
LMOM for pt to call me b/c Dr Dwain Sarna wants to see her in the office this week. I have two days in mind this week that would work best with DR Doreen Salvage schedule.

## 2012-08-30 NOTE — Patient Instructions (Addendum)
[facility] Discharge Instructions for Patients Receiving Chemotherapy  Today you received the following chemotherapy agents: herceptin  To help prevent nausea and vomiting after your treatment, we encourage you to take your nausea medication.  Take it as often as prescribed.     If you develop nausea and vomiting that is not controlled by your nausea medication, call the clinic. If it is after clinic hours your family physician or the after hours number for the clinic or go to the Emergency Department.   BELOW ARE SYMPTOMS THAT SHOULD BE REPORTED IMMEDIATELY:  *FEVER GREATER THAN 100.5 F  *CHILLS WITH OR WITHOUT FEVER  NAUSEA AND VOMITING THAT IS NOT CONTROLLED WITH YOUR NAUSEA MEDICATION  *UNUSUAL SHORTNESS OF BREATH  *UNUSUAL BRUISING OR BLEEDING  TENDERNESS IN MOUTH AND THROAT WITH OR WITHOUT PRESENCE OF ULCERS  *URINARY PROBLEMS  *BOWEL PROBLEMS  UNUSUAL RASH Items with * indicate a potential emergency and should be followed up as soon as possible.  Feel free to call the clinic you have any questions or concerns. The clinic phone number is [phone].   I have been informed and understand all the instructions given to me. I know to contact the clinic, my physician, or go to the Emergency Department if any problems should occur. I do not have any questions at this time, but understand that I may call the clinic during office hours   should I have any questions or need assistance in obtaining follow up care.    __________________________________________  _____________  __________ Signature of Patient or Authorized Representative            Date                   Time    __________________________________________ Nurse's Signature    

## 2012-08-31 ENCOUNTER — Telehealth (INDEPENDENT_AMBULATORY_CARE_PROVIDER_SITE_OTHER): Payer: Self-pay

## 2012-08-31 ENCOUNTER — Encounter (INDEPENDENT_AMBULATORY_CARE_PROVIDER_SITE_OTHER): Payer: Medicare Other | Admitting: General Surgery

## 2012-08-31 NOTE — Telephone Encounter (Signed)
Pt called in this am wanting to change her appt from 2/4 to 2/6 b/c she has to work this pm. I will start Dr Doreen Salvage office early at 1pm for him to see pt at the beginning of his clinic. The pt understands.

## 2012-09-02 ENCOUNTER — Telehealth (INDEPENDENT_AMBULATORY_CARE_PROVIDER_SITE_OTHER): Payer: Self-pay

## 2012-09-02 ENCOUNTER — Other Ambulatory Visit: Payer: Self-pay | Admitting: Oncology

## 2012-09-02 ENCOUNTER — Encounter (INDEPENDENT_AMBULATORY_CARE_PROVIDER_SITE_OTHER): Payer: Medicare Other | Admitting: General Surgery

## 2012-09-02 NOTE — Telephone Encounter (Signed)
Called pt to let her know that Dr Dwain Sarna said he would see her tomorrow am since she missed her appt today due to a flat tire. The pt agrees to come in at 8:00am on 09/03/12.

## 2012-09-03 ENCOUNTER — Encounter (INDEPENDENT_AMBULATORY_CARE_PROVIDER_SITE_OTHER): Payer: Medicare Other | Admitting: General Surgery

## 2012-09-03 ENCOUNTER — Other Ambulatory Visit: Payer: Medicare Other | Admitting: Lab

## 2012-09-03 ENCOUNTER — Ambulatory Visit: Payer: Medicare Other | Admitting: Oncology

## 2012-09-03 ENCOUNTER — Ambulatory Visit: Payer: Medicare Other

## 2012-09-06 ENCOUNTER — Telehealth (INDEPENDENT_AMBULATORY_CARE_PROVIDER_SITE_OTHER): Payer: Self-pay

## 2012-09-06 ENCOUNTER — Other Ambulatory Visit: Payer: Self-pay | Admitting: *Deleted

## 2012-09-06 DIAGNOSIS — G629 Polyneuropathy, unspecified: Secondary | ICD-10-CM

## 2012-09-06 DIAGNOSIS — C50519 Malignant neoplasm of lower-outer quadrant of unspecified female breast: Secondary | ICD-10-CM

## 2012-09-06 MED ORDER — OXYCODONE-ACETAMINOPHEN 5-325 MG PO TABS: ORAL_TABLET | ORAL | Status: DC

## 2012-09-06 NOTE — Telephone Encounter (Signed)
LMOM asking for the pt to call me back b/c I wanted to see why she was a no show on Friday 2/7. I also want to make her another appt to see Dr Dwain Sarna for 2/14 arrive at 8:00am. I have not made the appt yet until I speak with her about this appt.

## 2012-09-06 NOTE — Telephone Encounter (Signed)
Message left by pt stating need for refill on pain medication obtained 08/30/2012 of 45 tabs.  Amadea states she is taking 2 tabs in am with 1 neurontin, 1/2 tab mid day with 2 neurontin, 2 tabs post work, and then 2 tabs before bed with 2 neurontin.  Above regiman allows pt to [facility] ADL's with least amount of neuropathy.  Of note pt is scheduled for follow up 2/21 with AB/P.

## 2012-09-13 NOTE — Telephone Encounter (Signed)
LMOM for pt to call me back.

## 2012-09-14 ENCOUNTER — Encounter: Payer: Self-pay | Admitting: Oncology

## 2012-09-14 ENCOUNTER — Other Ambulatory Visit: Payer: Self-pay | Admitting: Oncology

## 2012-09-15 ENCOUNTER — Telehealth: Payer: Self-pay | Admitting: Oncology

## 2012-09-15 NOTE — Telephone Encounter (Signed)
Letter sent to patient from Dr. Magrinat. °

## 2012-09-15 NOTE — Telephone Encounter (Signed)
LMOM for pt to call me back.

## 2012-09-17 ENCOUNTER — Other Ambulatory Visit: Payer: Medicare Other | Admitting: Lab

## 2012-09-17 ENCOUNTER — Ambulatory Visit: Payer: Medicare Other

## 2012-09-17 ENCOUNTER — Telehealth: Payer: Self-pay | Admitting: *Deleted

## 2012-09-17 ENCOUNTER — Encounter (INDEPENDENT_AMBULATORY_CARE_PROVIDER_SITE_OTHER): Payer: Self-pay | Admitting: General Surgery

## 2012-09-17 ENCOUNTER — Ambulatory Visit: Payer: Medicare Other | Admitting: Physician Assistant

## 2012-09-17 NOTE — Telephone Encounter (Signed)
Received a message from the operator that the patient needs to cancel appts for today. Message given to desk RN, I canceled treatment appt.  [name]

## 2012-09-20 ENCOUNTER — Telehealth: Payer: Self-pay | Admitting: Oncology

## 2012-09-20 ENCOUNTER — Other Ambulatory Visit: Payer: Self-pay | Admitting: *Deleted

## 2012-09-20 NOTE — Telephone Encounter (Signed)
S/w the pt and she is aware of her r/s appts for march 2014

## 2012-09-21 ENCOUNTER — Encounter (INDEPENDENT_AMBULATORY_CARE_PROVIDER_SITE_OTHER): Payer: Medicare Other | Admitting: General Surgery

## 2012-09-21 ENCOUNTER — Telehealth: Payer: Self-pay | Admitting: *Deleted

## 2012-09-21 NOTE — Telephone Encounter (Signed)
Per staff message and POF I have scheduled appts.  [name]  

## 2012-09-22 ENCOUNTER — Other Ambulatory Visit: Payer: Self-pay | Admitting: Physician Assistant

## 2012-09-22 DIAGNOSIS — G629 Polyneuropathy, unspecified: Secondary | ICD-10-CM

## 2012-09-22 DIAGNOSIS — C50519 Malignant neoplasm of lower-outer quadrant of unspecified female breast: Secondary | ICD-10-CM

## 2012-09-22 MED ORDER — OXYCODONE-ACETAMINOPHEN 5-325 MG PO TABS: ORAL_TABLET | ORAL | Status: DC

## 2012-09-24 ENCOUNTER — Ambulatory Visit (INDEPENDENT_AMBULATORY_CARE_PROVIDER_SITE_OTHER): Payer: Medicare Other | Admitting: General Surgery

## 2012-09-24 ENCOUNTER — Encounter (INDEPENDENT_AMBULATORY_CARE_PROVIDER_SITE_OTHER): Payer: Self-pay | Admitting: General Surgery

## 2012-09-24 VITALS — BP 102/58 | HR 76 | Temp 97.3°F | Resp 16 | Ht 62.0 in | Wt 206.0 lb

## 2012-09-24 DIAGNOSIS — C50511 Malignant neoplasm of lower-outer quadrant of right female breast: Secondary | ICD-10-CM

## 2012-09-24 DIAGNOSIS — C50519 Malignant neoplasm of lower-outer quadrant of unspecified female breast: Secondary | ICD-10-CM

## 2012-09-24 NOTE — Progress Notes (Signed)
Patient ID: Catina Dallman, female   DOB: 03/24/1971, 41 y.o.   MRN: [id number]  Chief Complaint  Patient presents with  . Re-evaluation    eval rt breast     HPI Jericho Hundertmark is a 41 y.o. female.   HPI This is a 41-year-old female who was diagnosed initially with a locally advanced right breast cancer. Upon presentation she was noted to have a large right breast mass with a satellite nodule and multiple enlarged nodes. 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 She has completed some of her chemotherapy and remains on her Herceptin now. I saw her in the middle of January and send her to get an MRI for followup. She has not returned until today despite some of our best effort to get her in. She reports no real changes since her last visit he comes in to review her MRI as well as possible future treatment for breast cancer. Her MRI is below and still shows a 6 x 3.9 x 1.6 cm area in place where the 2 adjacent masses were before that is irregular and has low-grade enhancement. This is still in contact with the anterior aspect of her pectoralis major. The right breast remains larger than left breast and there is diffuse skin thickening and enhancement as well. There were no additional masses or areas of enhancement suspicious. Lymph nodes were all much better. Past Medical History  Diagnosis Date  . Lupus   . Joint pain   . Hearing loss   . Bleeding nose   . Bilateral swelling of feet   . Breast cancer 40  . Cancer 37    Cervical, treated with cryoablation  . Migraines   . Headache   . Asthma   . Neuromuscular disorder     Past Surgical History  Procedure Laterality Date  . Tonsillectomy    . Cholecystectomy    . 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 Portacath placement  03/11/2012    Procedure: INSERTION PORT-A-CATH;  Surgeon: Tavonte Seybold, MD;  Location: [facility];  Service: General;  Laterality: Left;  . Hand surgery   1997  . Appendectomy  1995  . Abdominal exploration surgery  1993,1997  . Tubal ligation  1997    Family History  Problem Relation Age of Onset  . Uterine cancer Mother 42  . Pancreatic cancer Paternal Uncle     diagnosed in his 50s; smoker; half uncle related through grandmother  . Cancer Paternal Uncle     Throat  . Liver cancer Maternal Grandmother     not a drinker  . Cancer Maternal Grandmother   . Breast cancer Cousin     3 paternal cousins with breast cancer, onset 45, 46 and one bilateral at  48 and 52  . Cancer Cousin     Breast  . Heart attack Father 64  . Breast cancer Paternal Grandmother 42  . Heart attack Paternal Grandfather   . Throat cancer Paternal Aunt     smoker; half uncle related through grandmother  . Cystic fibrosis Cousin   . Cancer Cousin     Breast  . Cancer Cousin     Breast    Social History History  Substance Use Topics  . Smoking status: Never Smoker   . Smokeless tobacco: Never Used  . Alcohol Use: Yes     Comment: occasional drinker    Allergies  Allergen Reactions  . Nsaids Anaphylaxis  .   Penicillins Anaphylaxis  . Tetanus Toxoids Anaphylaxis  . Hydrogen Peroxide Other (See Comments)    "It burns until I bleed."    Current Outpatient Prescriptions  Medication Sig Dispense Refill  . dexamethasone (DECADRON) 4 MG tablet TAKE 2 TABLETS BY MOUTH 2 TIMES A DAY THE DAY BEFORE TAXOTERE-THEN TAKE 2TABS 2 TIMES A DAY STARTING DAY AFTER CHEMO FOR 3DAYS  30 tablet  2  . furosemide (LASIX) 20 MG tablet Take 20 mg by mouth as needed.      . gabapentin (NEURONTIN) 300 MG capsule Take 600 mg by mouth daily.      . lidocaine-prilocaine (EMLA) cream Apply topically as needed.  30 g  0  . LORazepam (ATIVAN) 0.5 MG tablet Take 1 tablet (0.5 mg total) by mouth every 6 (six) hours as needed for anxiety.  30 tablet  1  . ondansetron (ZOFRAN) 8 MG tablet Take 1 tablet (8 mg total) by mouth every 12 (twelve) hours as needed for nausea.  20 tablet  0   . oxyCODONE (OXY IR/ROXICODONE) 5 MG immediate release tablet Take 2 tablets (10 mg total) by mouth daily with breakfast. As needed for pain  60 tablet  0  . oxyCODONE-acetaminophen (ROXICET) 5-325 MG per tablet 1-2 tabs PO Q 6 hrs PRN pain.  Maximum of 8 tablets in 24 hours.  60 tablet  0  . potassium chloride (K-DUR) 10 MEQ tablet Take 20 mEq by mouth once. Only when taking lasix      . promethazine (PHENERGAN) 25 MG tablet Take 1 tablet by mouth every 6 to 8 hrs as needed for nausea or vomiting  30 tablet  0  . sertraline (ZOLOFT) 50 MG tablet Take 1 tablet (50 mg total) by mouth daily.  30 tablet  2  . traMADol (ULTRAM) 50 MG tablet Take 1 tablet (50 mg total) by mouth every 6 (six) hours as needed for pain.  30 tablet  3   No current facility-administered medications for this visit.    Review of Systems Review of Systems  Constitutional: Negative for fever, chills and unexpected weight change.  HENT: Negative for hearing loss, congestion, sore throat, trouble swallowing and voice change.   Eyes: Negative for visual disturbance.  Respiratory: Negative for cough and wheezing.   Cardiovascular: Negative for chest pain, palpitations and leg swelling.  Gastrointestinal: Negative for nausea, vomiting, abdominal pain, diarrhea, constipation, blood in stool, abdominal distention and anal bleeding.  Genitourinary: Negative for hematuria, vaginal bleeding and difficulty urinating.  Musculoskeletal: Negative for arthralgias.  Skin: Negative for rash and wound.  Neurological: Negative for seizures, syncope and headaches.  Hematological: Negative for adenopathy. Does not bruise/bleed easily.  Psychiatric/Behavioral: Negative for confusion.    Blood pressure 102/58, pulse 76, temperature 97.3 F (36.3 C), temperature source Temporal, resp. rate 16, height 5' 2" (1.575 m), weight 206 lb (93.441 kg).  Physical Exam Physical Exam  Vitals reviewed. Constitutional: She appears well-developed and  well-nourished.  Cardiovascular: Normal rate, regular rhythm and normal heart sounds.   Pulmonary/Chest: Effort normal and breath sounds normal. She has no wheezes. She has no rales. Right breast exhibits skin change (skin thickening). Right breast exhibits no inverted nipple, no mass, no nipple discharge and no tenderness. Left breast exhibits no inverted nipple, no mass, no nipple discharge, no skin change and no tenderness. Breasts are symmetrical.  Lymphadenopathy:    She has no cervical adenopathy.    She has no axillary adenopathy.       Right:   No supraclavicular adenopathy present.       Left: No supraclavicular adenopathy present.    Data Reviewed BILATERAL BREAST MRI WITH AND WITHOUT CONTRAST  Technique: Multiplanar, multisequence MR images of both breasts  were obtained prior to and following the intravenous administration  of 18ml of MultiHance. Three dimensional images were evaluated at  the independent DynaCad workstation.  Comparison: 03/02/2012.  Findings: Minimal background parenchymal enhancement in both  breasts.  Elongated, irregularly marginated area of low grade enhancement in  the middle and posterior thirds of the lower outer quadrant of the  right breast containing a biopsy marker clip artifact anteriorly.  This is at the location of the previously demonstrated two adjacent  masses and measures 6.0 x 3.9 x 1.6 cm in maximum dimensions. The  previously seen masses in that area are no longer demonstrated.  The low grade enhancement in this area has persistent kinetics.  This is in contact with the anterior aspect of the pectoralis major  muscle on the right without extension into the muscle. The right  breast remains larger than the left breast with diffuse skin  thickening and enhancement.  No additional masses or areas enhancement suspicious for malignancy  in either breast.  The previously demonstrated multiple enlarged right axillary lymph  nodes are  significantly smaller. On image number 190 of the first  post contrast sequence, one of the more laterally located lymph  nodes has a short axis diameter of 0.7 cm. This previously had a  short axis diameter of 2.4 cm.  IMPRESSION:  1. 6.0 x 3.9 x 1.6 cm elongated area of scarring in the lower  outer quadrant of the right breast at the location of the  previously demonstrated two adjacent masses. Those masses are no  longer seen. This extends to the pectoralis major muscle without  extension into the muscle.  2. Significantly improved metastatic right axillary adenopathy.  The lymph nodes in the right axilla remain mildly asymmetrically  enlarged and increased in number compared to the left.  3. No new findings suspicious for malignancy elsewhere in either  breast and no new adenopathy.   Assessment   Node positive locally advanced right breast cancer s/p primary chemotherapy    Plan    Right MRM We had a long discussion today about her options for surgery. I'm going to discuss this with her medical oncologist also. I've reviewed her MRI as well as all of her prior information. I have examined her today as well. I'm concerned on her exam that she still has some skin thickening. This also shows on her MRI and there still is a fairly decent size area of low-grade enhancement in that breast. I think this it was going to be difficult to convert her to bct with her breast size her tumor size to begin with. I think for the treatment of her breast cancer the best option at this point is to proceed with a right modified radical mastectomy given the MRI, her exam, and preoperative evaluation. Her nodes were positive upon initial presentation and this would result an axillary lymph node dissection.  We discussed the hospital stay being 1-2 days. We discussed her recovery. We discussed the issues of drains.  I told her that the risks include but are not limited to bleeding, return to the operating  room for hematoma evacuation, infection, chronic shoulder pain, lymphedema with tricortical wrist up to 50%. She understands all of this and will try to proceed as soon as   possible.        Cesilia Shinn 09/24/2012, 10:31 AM    

## 2012-09-28 ENCOUNTER — Ambulatory Visit (HOSPITAL_BASED_OUTPATIENT_CLINIC_OR_DEPARTMENT_OTHER): Payer: Medicare Other

## 2012-09-28 ENCOUNTER — Encounter (HOSPITAL_COMMUNITY): Payer: Self-pay | Admitting: Pharmacy Technician

## 2012-09-28 ENCOUNTER — Ambulatory Visit: Payer: Medicare Other | Admitting: Physician Assistant

## 2012-09-28 ENCOUNTER — Other Ambulatory Visit: Payer: Medicare Other | Admitting: Lab

## 2012-09-28 ENCOUNTER — Ambulatory Visit (HOSPITAL_BASED_OUTPATIENT_CLINIC_OR_DEPARTMENT_OTHER): Payer: Medicare Other | Admitting: Physician Assistant

## 2012-09-28 ENCOUNTER — Encounter: Payer: Self-pay | Admitting: Physician Assistant

## 2012-09-28 ENCOUNTER — Other Ambulatory Visit (HOSPITAL_BASED_OUTPATIENT_CLINIC_OR_DEPARTMENT_OTHER): Payer: Medicare Other | Admitting: Lab

## 2012-09-28 VITALS — BP 114/82 | HR 68 | Temp 97.5°F | Resp 20 | Ht 62.0 in | Wt 203.2 lb

## 2012-09-28 DIAGNOSIS — G609 Hereditary and idiopathic neuropathy, unspecified: Secondary | ICD-10-CM

## 2012-09-28 DIAGNOSIS — C50911 Malignant neoplasm of unspecified site of right female breast: Secondary | ICD-10-CM

## 2012-09-28 DIAGNOSIS — G629 Polyneuropathy, unspecified: Secondary | ICD-10-CM

## 2012-09-28 DIAGNOSIS — C50519 Malignant neoplasm of lower-outer quadrant of unspecified female breast: Secondary | ICD-10-CM

## 2012-09-28 DIAGNOSIS — F329 Major depressive disorder, single episode, unspecified: Secondary | ICD-10-CM

## 2012-09-28 DIAGNOSIS — F3289 Other specified depressive episodes: Secondary | ICD-10-CM

## 2012-09-28 DIAGNOSIS — C50511 Malignant neoplasm of lower-outer quadrant of right female breast: Secondary | ICD-10-CM

## 2012-09-28 DIAGNOSIS — Z5112 Encounter for antineoplastic immunotherapy: Secondary | ICD-10-CM

## 2012-09-28 DIAGNOSIS — Z170 Estrogen receptor positive status [ER+]: Secondary | ICD-10-CM

## 2012-09-28 LAB — COMPREHENSIVE METABOLIC PANEL (CC13)
AST: 68 U/L — ABNORMAL HIGH (ref 5–34)
Albumin: 3.3 g/dL — ABNORMAL LOW (ref 3.5–5.0)
Alkaline Phosphatase: 122 U/L (ref 40–150)
Potassium: 4.1 mEq/L (ref 3.5–5.1)
Sodium: 141 mEq/L (ref 136–145)
Total Bilirubin: 0.23 mg/dL (ref 0.20–1.20)
Total Protein: 7.1 g/dL (ref 6.4–8.3)

## 2012-09-28 LAB — CBC WITH DIFFERENTIAL/PLATELET
BASO%: 0.7 % (ref 0.0–2.0)
EOS%: 2.3 % (ref 0.0–7.0)
MCH: 34.5 pg — ABNORMAL HIGH (ref 25.1–34.0)
MCHC: 33.7 g/dL (ref 31.5–36.0)
MCV: 102.5 fL — ABNORMAL HIGH (ref 79.5–101.0)
MONO%: 9.9 % (ref 0.0–14.0)
NEUT#: 1.9 10*3/uL (ref 1.5–6.5)
RBC: 4.06 10*6/uL (ref 3.70–5.45)
RDW: 13.1 % (ref 11.2–14.5)

## 2012-09-28 MED ORDER — GABAPENTIN 300 MG PO CAPS: 900.0000 mg | ORAL_CAPSULE | Freq: Four times a day (QID) | ORAL | Status: DC

## 2012-09-28 MED ORDER — DULOXETINE HCL 20 MG PO CPEP: 20.0000 mg | ORAL_CAPSULE | Freq: Every day | ORAL | Status: DC

## 2012-09-28 MED ORDER — TRASTUZUMAB CHEMO INJECTION 440 MG
6.0000 mg/kg | Freq: Once | INTRAVENOUS | Status: AC
  Administered 2012-09-28: 546 mg via INTRAVENOUS
  Filled 2012-09-28: qty 26

## 2012-09-28 MED ORDER — SODIUM CHLORIDE 0.9 % IJ SOLN
10.0000 mL | INTRAMUSCULAR | Status: DC | PRN
  Administered 2012-09-28: 10 mL
  Filled 2012-09-28: qty 10

## 2012-09-28 MED ORDER — HEPARIN SOD (PORK) LOCK FLUSH 100 UNIT/ML IV SOLN
500.0000 [IU] | Freq: Once | INTRAVENOUS | Status: AC | PRN
  Administered 2012-09-28: 500 [IU]
  Filled 2012-09-28: qty 5

## 2012-09-28 MED ORDER — SODIUM CHLORIDE 0.9 % IV SOLN
Freq: Once | INTRAVENOUS | Status: AC
  Administered 2012-09-28: 15:00:00 via INTRAVENOUS

## 2012-09-28 MED ORDER — ACETAMINOPHEN 325 MG PO TABS
650.0000 mg | ORAL_TABLET | Freq: Once | ORAL | Status: AC
  Administered 2012-09-28: 650 mg via ORAL

## 2012-09-28 MED ORDER — DIPHENHYDRAMINE HCL 25 MG PO CAPS
50.0000 mg | ORAL_CAPSULE | Freq: Once | ORAL | Status: AC
  Administered 2012-09-28: 25 mg via ORAL

## 2012-09-28 MED ORDER — OXYCODONE HCL 5 MG PO TABS: 10.0000 mg | ORAL_TABLET | Freq: Every day | ORAL | Status: DC

## 2012-09-28 MED ORDER — OXYCODONE HCL 5 MG PO TABS: ORAL_TABLET | ORAL | Status: DC

## 2012-09-28 MED ORDER — LIDOCAINE-PRILOCAINE 2.5-2.5 % EX CREA: TOPICAL_CREAM | CUTANEOUS | Status: AC | PRN

## 2012-09-28 NOTE — Patient Instructions (Signed)
[facility] Discharge Instructions for Patients Receiving Chemotherapy  Today you received the following chemotherapy agents Herceptin.  To help prevent nausea and vomiting after your treatment, we encourage you to take your nausea medication.   If you develop nausea and vomiting that is not controlled by your nausea medication, call the clinic. If it is after clinic hours your family physician or the after hours number for the clinic or go to the Emergency Department.   BELOW ARE SYMPTOMS THAT SHOULD BE REPORTED IMMEDIATELY:  *FEVER GREATER THAN 100.5 F  *CHILLS WITH OR WITHOUT FEVER  NAUSEA AND VOMITING THAT IS NOT CONTROLLED WITH YOUR NAUSEA MEDICATION  *UNUSUAL SHORTNESS OF BREATH  *UNUSUAL BRUISING OR BLEEDING  TENDERNESS IN MOUTH AND THROAT WITH OR WITHOUT PRESENCE OF ULCERS  *URINARY PROBLEMS  *BOWEL PROBLEMS  UNUSUAL RASH Items with * indicate a potential emergency and should be followed up as soon as possible.  One of the nurses will contact you 24 hours after your treatment. Please let the nurse know about any problems that you may have experienced. Feel free to call the clinic you have any questions or concerns. The clinic phone number is [phone].   I have been informed and understand all the instructions given to me. I know to contact the clinic, my physician, or go to the Emergency Department if any problems should occur. I do not have any questions at this time, but understand that I may call the clinic during office hours   should I have any questions or need assistance in obtaining follow up care.    __________________________________________  _____________  __________ Signature of Patient or Authorized Representative            Date                   Time    __________________________________________ Nurse's Signature    

## 2012-09-28 NOTE — Progress Notes (Signed)
ID: Holly Parrish   DOB: 1971/03/07  MR#: [id number]  CSN#:[id number]  PCP: Provider Not In System GYN:  SU:  OTHER MD:   HISTORY OF PRESENT ILLNESS: Holly Parrish noted a mass in her right breast about 5 months ago. She did not have any healthcare an insurance and so delayed getting this looked after. She ultimately underwent a mammogram on 02/16/2012. There showed a firm palpable mass right breast 8:00 position measuring 4 cm there was an additional 3 cm lower right axillary lymph node. Ultrasound confirmed the presence of this mass in the breast measuring 2.7 x 2.7 x 2.9 cm. There were 2 large right lower right axillary lymph nodes largest one measuring 4 cm a smaller one measuring 2 x 3.1 cm. She underwent biopsies of both these areas. 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 This is also the ER and PR positive.  STAGE:  Cancer of lower-outer quadrant of female breast  Primary site: Breast (Right)  Staging method: AJCC 7th Edition  Clinical: Stage IIB (T2, N1, cM0)  Summary: Stage IIB (T2, N1, cM0)  Remainder history as noted below.  INTERVAL HISTORY: Holly Parrish  returns today accompanied by her husband for followup of her locally advanced right breast carcinoma.  Her last dose of carboplatin/gemcitabine with trastuzumab was given in early December and has been held since that time due to poor tolerance and a worsening of peripheral neuropathy.  Most recent dose of trastuzumab was given 08/30/2012, and is due again today.  She continues to be very anxious and tearful on presentation.  She continues to feel poorly physically, is depressed and anxious, and also has multiple stressors at home with her family and her financial situation.  The neuropathic pain in her hands and feet is her biggest complaint right now. She's been taking oxycodone/APAP, but would like to avoid multiple doses of Tylenol throughout the day. She does feel that the oxycodone alone is  helpful. She continues on gabapentin, 900 mg up to 4 times daily. She still continues, however, to have severe discomfort secondary to neuropathy. It is affecting any of her day-to-day activities, including her walking. She often wakes up during the night due to pain in her feet. She has tried Cymbalta in the past, but is willing to try it again.   REVIEW OF SYSTEMS: Fortunately, Holly Parrish has had no fevers or chills. She's had no rashes or skin changes. She has no new cough, increased shortness of breath, orthopnea, or PND and denies any chest pain. No headaches, dizziness or change in vision. She is having increased vaginal dryness (she has not had a menstrual cycle since beginning chemotherapy) which is causing general discomfort. She denies any signs of abnormal bleeding.  She has had only occasional problems with nausea for which she takes ondansetron effectively. She is having regular bowel movements with the use of Colace and Miralax. She has mild swelling in her feet and ankles which comes and goes.  A detailed review of systems is otherwise stable and noncontributory.   PAST MEDICAL HISTORY: Past Medical History  Diagnosis Date  . Lupus   . Joint pain   . Hearing loss   . Bleeding nose   . Bilateral swelling of feet   . Breast cancer 40  . Cancer 37    Cervical, treated with cryoablation  . Migraines   . Headache   . Asthma   . Neuromuscular disorder     PAST SURGICAL HISTORY: Past  Surgical History  Procedure Laterality Date  . Tonsillectomy    . Cholecystectomy    . 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 Portacath placement  03/11/2012    Procedure: INSERTION PORT-A-CATH;  Surgeon: Emelia Loron, MD;  Location: [facility];  Service: General;  Laterality: Left;  . Hand surgery  1997  . Appendectomy  1995  . Abdominal exploration surgery  [id number]  . Tubal ligation  1997    FAMILY HISTORY Family History  Problem Relation Age of Onset  . Uterine  cancer Mother 61  . Pancreatic cancer Paternal Uncle     diagnosed in his 79s; smoker; half uncle related through grandmother  . Cancer Paternal Uncle     Throat  . Liver cancer Maternal Grandmother     not a drinker  . Cancer Maternal Grandmother   . Breast cancer Cousin     3 paternal cousins with breast cancer, onset 25, 35 and one bilateral at  19 and 5  . Cancer Cousin     Breast  . Heart attack Father 59  . Breast cancer Paternal Grandmother 63  . Heart attack Paternal Grandfather   . Throat cancer Paternal Aunt     smoker; half uncle related through grandmother  . Cystic fibrosis Cousin   . Cancer Cousin     Breast  . Cancer Cousin     Breast    GYNECOLOGIC HISTORY: G5 P5,  one daughter died at age 64, menarche at age 86; age of first live birth is 50; no recent history of birth control pill use; has normal menses.  SOCIAL HISTORY: From [place] where most of her family still resides.  Works as a Fish farm manager.  She has been married twice and recently has been divorced.  She has four children who live in [place].    ADVANCED DIRECTIVES:  HEALTH MAINTENANCE: History  Substance Use Topics  . Smoking status: Never Smoker   . Smokeless tobacco: Never Used  . Alcohol Use: Yes     Comment: occasional drinker     Colonoscopy:  PAP:  Bone density:  Lipid panel:  Allergies  Allergen Reactions  . Bee Venom Anaphylaxis  . Nsaids Anaphylaxis  . Penicillins Anaphylaxis  . Tetanus Toxoids Anaphylaxis  . Hydrogen Peroxide Other (See Comments)    "It burns until I bleed."    Current Outpatient Prescriptions  Medication Sig Dispense Refill  . B Complex Vitamins (VITAMIN B COMPLEX PO) Take 1 tablet by mouth daily.       Marland Kitchen docusate sodium (COLACE) 100 MG capsule Take 200 mg by mouth every morning.      . lidocaine-prilocaine (EMLA) cream Apply topically as needed.  30 g  0  . LORazepam (ATIVAN) 0.5 MG tablet Take 0.5 mg by mouth every 8 (eight) hours as needed  for anxiety.      . ondansetron (ZOFRAN) 8 MG tablet Take 1 tablet (8 mg total) by mouth every 12 (twelve) hours as needed for nausea.  20 tablet  0  . oxyCODONE-acetaminophen (ROXICET) 5-325 MG per tablet 1-2 tabs PO Q 6 hrs PRN pain.  Maximum of 8 tablets in 24 hours.  60 tablet  0  . aspirin-acetaminophen-caffeine (EXCEDRIN MIGRAINE) 250-250-65 MG per tablet Take 1 tablet by mouth every 6 (six) hours as needed for pain.      . DULoxetine (CYMBALTA) 20 MG capsule Take 1 capsule (20 mg total) by mouth at bedtime.  30 capsule  3  .  EPINEPHrine (EPIPEN 2-PAK) 0.3 mg/0.3 mL DEVI Inject 0.3 mg into the muscle once. Bee venom      . gabapentin (NEURONTIN) 300 MG capsule Take 3 capsules (900 mg total) by mouth 4 (four) times daily.  360 capsule  1  . oxyCODONE (OXY IR/ROXICODONE) 5 MG immediate release tablet 1-2 tabs by mouth every 8 hours PRN pain  60 tablet  0  . promethazine (PHENERGAN) 25 MG tablet Take 1 tablet by mouth every 6 to 8 hrs as needed for nausea or vomiting  30 tablet  0   No current facility-administered medications for this visit.   Facility-Administered Medications Ordered in Other Visits  Medication Dose Route Frequency Provider Last Rate Last Dose  . 0.9 %  sodium chloride infusion   Intravenous Once Amy Allegra Grana, PA      . diphenhydrAMINE (BENADRYL) capsule 50 mg  50 mg Oral Once Amy G Berry, PA      . heparin lock flush 100 unit/mL  500 Units Intracatheter Once PRN Catalina Gravel, PA      . sodium chloride 0.9 % injection 10 mL  10 mL Intracatheter PRN Amy Allegra Grana, PA      . trastuzumab (HERCEPTIN) 546 mg in sodium chloride 0.9 % 250 mL chemo infusion  6 mg/kg (Treatment Plan Actual) Intravenous Once Catalina Gravel, PA 552 mL/hr at 09/28/12 1539 546 mg at 09/28/12 1539    OBJECTIVE: Young white female who appears tired and anxious Filed Vitals:   09/28/12 1420  BP: 114/82  Pulse: 68  Temp: 97.5 F (36.4 C)  Resp: 20     Body mass index is 37.16 kg/(m^2).    ECOG FS: 2 Filed  Weights   09/28/12 1420  Weight: 203 lb 3.2 oz (92.171 kg)    Sclerae unicteric Oropharynx clear No cervical or supraclavicular adenopathy Lungs no rales or rhonchi Heart regular rate and rhythm Abdomen soft, nontender, with positive bowel sounds MSK no focal spinal tenderness, Nonpitting pedal edema, equal bilaterally.  No erythema or palpable cords. No upper extremity edema Neuro: nonfocal, well oriented with anxious affect. Breasts: Unable to palpate distinct mass in right breast although there is some notable thickening in the lateral portion of the breast.  Left breast is unremarkable. Axillae are benign with no current palpable adenopathy.  Port is intact in the left upper chest wall, with no erythema, edema, or evidence of infection.   LAB RESULTS: Lab Results  Component Value Date   WBC 5.7 09/28/2012   NEUTROABS 1.9 09/28/2012   HGB 14.0 09/28/2012   HCT 41.6 09/28/2012   MCV 102.5* 09/28/2012   PLT 445* 09/28/2012      Chemistry      Component Value Date/Time   NA 141 09/28/2012 1400   NA 140 05/07/2012 0950   K 4.1 09/28/2012 1400   K 4.3 05/07/2012 0950   CL 106 09/28/2012 1400   CL 108 05/07/2012 0950   CO2 26 09/28/2012 1400   CO2 23 05/07/2012 0950   BUN 16.3 09/28/2012 1400   BUN 16 05/07/2012 0950   CREATININE 0.8 09/28/2012 1400   CREATININE 0.79 05/07/2012 0950      Component Value Date/Time   CALCIUM 9.3 09/28/2012 1400   CALCIUM 9.4 05/07/2012 0950   ALKPHOS 122 09/28/2012 1400   ALKPHOS 73 05/07/2012 0950   AST 68* 09/28/2012 1400   AST 18 05/07/2012 0950   ALT 60* 09/28/2012 1400   ALT 23 05/07/2012 0950  BILITOT 0.23 09/28/2012 1400   BILITOT 0.4 05/07/2012 0950       Lab Results  Component Value Date   LABCA2 11 05/28/2012     STUDIES: Most recent echocardiogram on 08/17/2012 showed a well preserved ejection fraction of 55-60%.   08/13/2012 RADIOLOGY REPORT*  Clinical Data: Ongoing neoadjuvant chemotherapy for right breast  invasive mammary carcinoma  with right axillary metastatic  adenopathy.  BILATERAL BREAST MRI WITH AND WITHOUT CONTRAST  Technique: Multiplanar, multisequence MR images of both breasts  were obtained prior to and following the intravenous administration  of 18ml of MultiHance. Three dimensional images were evaluated at  the independent DynaCad workstation.  Comparison: 03/02/2012.  Findings: Minimal background parenchymal enhancement in both  breasts.  Elongated, irregularly marginated area of low grade enhancement in  the middle and posterior thirds of the lower outer quadrant of the  right breast containing a biopsy marker clip artifact anteriorly.  This is at the location of the previously demonstrated two adjacent  masses and measures 6.0 x 3.9 x 1.6 cm in maximum dimensions. The  previously seen masses in that area are no longer demonstrated.  The low grade enhancement in this area has persistent kinetics.  This is in contact with the anterior aspect of the pectoralis major  muscle on the right without extension into the muscle. The right  breast remains larger than the left breast with diffuse skin  thickening and enhancement.  No additional masses or areas enhancement suspicious for malignancy  in either breast.  The previously demonstrated multiple enlarged right axillary lymph  nodes are significantly smaller. On image number 190 of the first  post contrast sequence, one of the more laterally located lymph  nodes has a short axis diameter of 0.7 cm. This previously had a  short axis diameter of 2.4 cm.  IMPRESSION:  1. 6.0 x 3.9 x 1.6 cm elongated area of scarring in the lower  outer quadrant of the right breast at the location of the  previously demonstrated two adjacent masses. Those masses are no  longer seen. This extends to the pectoralis major muscle without  extension into the muscle.  2. Significantly improved metastatic right axillary adenopathy.  The lymph nodes in the right axilla remain  mildly asymmetrically  enlarged and increased in number compared to the left.  3. No new findings suspicious for malignancy elsewhere in either  breast and no new adenopathy.  RECOMMENDATION:  Treatment plan  THREE-DIMENSIONAL MR IMAGE RENDERING ON INDEPENDENT WORKSTATION:  Three-dimensional MR images were rendered by post-processing of the  original MR data on an independent workstation. The three-  dimensional MR images were interpreted, and findings were reported  in the accompanying complete MRI report for this study.  BI-RADS CATEGORY 6: Known biopsy-proven malignancy - appropriate  action should be taken.  Original Report Authenticated By: Beckie Salts, M.D.    ASSESSMENT: 42 y.o.   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  Palpable right axillary adenopathy at presentation.  (2)  Being treated in the neoadjuvant setting.  Status post:  (a)  3 cycles of Taxotere/carbo with Herceptin, with poor tolerance  (b)  One cycle of weekly Carbo/Taxol and Herceptin in early November 2013, with poor tolerance  (c)  2 doses of weekly Carbo/Gemzar with Herceptin with poor tolerance and worsening neuropathy.  Last dose of chemo given in  early December 2013.  (3)  Herceptin to be continued for a total of one year.  (4)  Patient will need definitive surgery, followed by radiation, then anti-estrogen therapy.  PLAN: Holly Parrish will proceed to treatment today as scheduled for her next dose of trastuzumab.  Over half of her one hour appointment was spent counseling the patient regarding her diagnosis, her current complaints, and her treatment plan from this point forward.  We are switching her pain medication from oxycodone/APAP to oxycodone 5 mg instructions to take 1-2 tablets every 8 hours as needed for pain. She can take Tylenol as needed, but hopefully this  will reduce the amount of Tylenol she is taking. She'll continue on the gabapentin at her current dose, 900 mg 4 times daily. We're also going to try a low dose of Cymbalta at night, 20 mg, to see if this will help with the depression, the neuropathy, and the chronic joint pain associated to her fibromyalgia. I cautioned the patient not to drive while she is taking these medications, especially the oxycodone and the gabapentin which both have the potential to make her sleepy and/or dizzy. I also refilled her Emla cream which she uses with her treatments.  I suggested she try soaking her nails and a water/Listerine solution and try Tea tree oil topically.  Holly Parrish is scheduled for her right mastectomy on March 12. I will see HER-2 weeks later when she returns on March 25 for followup prior to her next dose of trastuzumab. Hopefully we'll have her pathology report back at that time, and discuss in more detail her long-term treatment plan.  Of course we will be continuing the trastuzumab to complete a total of one year, and she will need at those every 3 months, the next being due in April.  Holly Parrish voices understanding and agreement with our plan, and will call if she has any changes or problems prior to her appointment here next week.   BERRY,AMY    09/28/2012

## 2012-09-29 ENCOUNTER — Telehealth: Payer: Self-pay | Admitting: Oncology

## 2012-09-29 NOTE — Telephone Encounter (Signed)
lmonvm for pt re appt for 3/25 and mailed schedule.

## 2012-09-30 ENCOUNTER — Encounter (HOSPITAL_COMMUNITY)
Admission: RE | Admit: 2012-09-30 | Discharge: 2012-09-30 | Disposition: A | Discharge status: Home / Self Care | Payer: Medicare Other | Source: Ambulatory Visit | Attending: General Surgery | Admitting: General Surgery

## 2012-09-30 ENCOUNTER — Encounter (HOSPITAL_COMMUNITY): Payer: Self-pay

## 2012-09-30 ENCOUNTER — Other Ambulatory Visit (HOSPITAL_COMMUNITY): Payer: Self-pay | Admitting: *Deleted

## 2012-09-30 HISTORY — DX: Unspecified osteoarthritis, unspecified site: M19.90

## 2012-09-30 LAB — SURGICAL PCR SCREEN
MRSA, PCR: POSITIVE — AB
Staphylococcus aureus: POSITIVE — AB

## 2012-09-30 LAB — PREGNANCY, URINE: Preg Test, Ur: NEGATIVE

## 2012-09-30 NOTE — Patient Instructions (Addendum)
Arabela Schack  09/30/2012                           YOUR PROCEDURE IS SCHEDULED ON: 10/06/12               PLEASE REPORT TO SHORT STAY CENTER AT :  6:00 AM               CALL THIS NUMBER IF ANY PROBLEMS THE DAY OF SURGERY :               [phone]                      REMEMBER:   Do not eat food or drink liquids AFTER MIDNIGHT   Take these medicines the morning of surgery with A SIP OF WATER: GABAPENTIN / OXYCODONE   Do not wear jewelry, make-up   Do not wear lotions, powders, or perfumes.   Do not shave legs or underarms 12 hrs. before surgery (men may shave face)  Do not bring valuables to the hospital.  Contacts, dentures or bridgework may not be worn into surgery.  Leave suitcase in the car. After surgery it may be brought to your room.  For patients admitted to the hospital more than one night, checkout time is 11:00                          The day of discharge.   Patients discharged the day of surgery will not be allowed to drive home                             If going home same day of surgery, must have someone stay with you first                           24 hrs at home and arrange for some one to drive you home from hospital.    Special Instructions:   Please read over the following fact sheets that you were given:               1. MRSA  INFORMATION                      2. [facility] PREPARING FOR SURGERY SHEET               3. DISCONTINUE ASPIRIN AND HERBAL MEDICATIONS 5 DAYS PREOP                                                X_____________________________________________________________________        Failure to follow these instructions may result in cancellation of your surgery

## 2012-10-03 ENCOUNTER — Encounter: Payer: Self-pay | Admitting: Oncology

## 2012-10-04 ENCOUNTER — Other Ambulatory Visit (INDEPENDENT_AMBULATORY_CARE_PROVIDER_SITE_OTHER): Payer: Self-pay | Admitting: General Surgery

## 2012-10-05 MED ORDER — VANCOMYCIN HCL 10 G IV SOLR
1500.0000 mg | INTRAVENOUS | Status: DC
  Filled 2012-10-05: qty 1500

## 2012-10-06 ENCOUNTER — Inpatient Hospital Stay (HOSPITAL_COMMUNITY): Payer: Medicare Other | Admitting: Certified Registered Nurse Anesthetist

## 2012-10-06 ENCOUNTER — Encounter (HOSPITAL_COMMUNITY)
Admission: RE | Disposition: A | Discharge status: Home / Self Care | Payer: Self-pay | Source: Ambulatory Visit | Attending: General Surgery

## 2012-10-06 ENCOUNTER — Inpatient Hospital Stay (HOSPITAL_COMMUNITY)
Admission: RE | Admit: 2012-10-06 | Discharge: 2012-10-08 | DRG: 583 | Disposition: A | Discharge status: Home / Self Care | Payer: Medicare Other | Source: Ambulatory Visit | Attending: General Surgery | Admitting: General Surgery

## 2012-10-06 ENCOUNTER — Inpatient Hospital Stay: Admit: 2012-10-06 | Payer: Self-pay | Admitting: General Surgery

## 2012-10-06 ENCOUNTER — Encounter (HOSPITAL_COMMUNITY): Payer: Self-pay | Admitting: Certified Registered Nurse Anesthetist

## 2012-10-06 ENCOUNTER — Encounter (HOSPITAL_COMMUNITY): Payer: Self-pay | Admitting: *Deleted

## 2012-10-06 DIAGNOSIS — G629 Polyneuropathy, unspecified: Secondary | ICD-10-CM

## 2012-10-06 DIAGNOSIS — C50911 Malignant neoplasm of unspecified site of right female breast: Secondary | ICD-10-CM

## 2012-10-06 DIAGNOSIS — Z79899 Other long term (current) drug therapy: Secondary | ICD-10-CM

## 2012-10-06 DIAGNOSIS — C50919 Malignant neoplasm of unspecified site of unspecified female breast: Secondary | ICD-10-CM

## 2012-10-06 DIAGNOSIS — M329 Systemic lupus erythematosus, unspecified: Secondary | ICD-10-CM | POA: Diagnosis present

## 2012-10-06 DIAGNOSIS — Z22322 Carrier or suspected carrier of Methicillin resistant Staphylococcus aureus: Secondary | ICD-10-CM

## 2012-10-06 DIAGNOSIS — Z9221 Personal history of antineoplastic chemotherapy: Secondary | ICD-10-CM

## 2012-10-06 DIAGNOSIS — J45909 Unspecified asthma, uncomplicated: Secondary | ICD-10-CM | POA: Diagnosis present

## 2012-10-06 HISTORY — PX: MASTECTOMY MODIFIED RADICAL: SHX5962

## 2012-10-06 SURGERY — MASTECTOMY, MODIFIED RADICAL
Anesthesia: General | Laterality: Right

## 2012-10-06 SURGERY — MASTECTOMY, MODIFIED RADICAL
Anesthesia: General | Site: Breast | Laterality: Right | Wound class: Clean

## 2012-10-06 MED ORDER — MIDAZOLAM HCL 5 MG/5ML IJ SOLN
INTRAMUSCULAR | Status: DC | PRN
  Administered 2012-10-06: 2 mg via INTRAVENOUS

## 2012-10-06 MED ORDER — ACETAMINOPHEN 650 MG RE SUPP: 650.0000 mg | Freq: Four times a day (QID) | RECTAL | Status: DC | PRN

## 2012-10-06 MED ORDER — DEXAMETHASONE SODIUM PHOSPHATE 10 MG/ML IJ SOLN
INTRAMUSCULAR | Status: DC | PRN
  Administered 2012-10-06: 10 mg via INTRAVENOUS

## 2012-10-06 MED ORDER — HYDROMORPHONE HCL PF 1 MG/ML IJ SOLN
0.2500 mg | INTRAMUSCULAR | Status: DC | PRN
  Administered 2012-10-06 (×4): 0.5 mg via INTRAVENOUS

## 2012-10-06 MED ORDER — 0.9 % SODIUM CHLORIDE (POUR BTL) OPTIME
TOPICAL | Status: DC | PRN
  Administered 2012-10-06 (×3): 1000 mL

## 2012-10-06 MED ORDER — METOCLOPRAMIDE HCL 5 MG/ML IJ SOLN
INTRAMUSCULAR | Status: DC | PRN
  Administered 2012-10-06: 10 mg via INTRAVENOUS

## 2012-10-06 MED ORDER — PANTOPRAZOLE SODIUM 40 MG IV SOLR
40.0000 mg | Freq: Every day | INTRAVENOUS | Status: DC
  Administered 2012-10-06 – 2012-10-07 (×2): 40 mg via INTRAVENOUS
  Filled 2012-10-06 (×4): qty 40

## 2012-10-06 MED ORDER — MUPIROCIN 2 % EX OINT
TOPICAL_OINTMENT | CUTANEOUS | Status: AC
  Administered 2012-10-06: 1 via NASAL
  Filled 2012-10-06: qty 22

## 2012-10-06 MED ORDER — LIDOCAINE HCL (CARDIAC) 20 MG/ML IV SOLN
INTRAVENOUS | Status: DC | PRN
  Administered 2012-10-06: 100 mg via INTRAVENOUS

## 2012-10-06 MED ORDER — MUPIROCIN 2 % EX OINT: TOPICAL_OINTMENT | Freq: Two times a day (BID) | CUTANEOUS | Status: DC

## 2012-10-06 MED ORDER — PROPOFOL 10 MG/ML IV BOLUS
INTRAVENOUS | Status: DC | PRN
  Administered 2012-10-06: 50 mg via INTRAVENOUS
  Administered 2012-10-06: 150 mg via INTRAVENOUS

## 2012-10-06 MED ORDER — LORAZEPAM 0.5 MG PO TABS: 0.5000 mg | ORAL_TABLET | Freq: Three times a day (TID) | ORAL | Status: DC | PRN

## 2012-10-06 MED ORDER — LACTATED RINGERS IV SOLN: INTRAVENOUS | Status: DC

## 2012-10-06 MED ORDER — DOCUSATE SODIUM 100 MG PO CAPS
200.0000 mg | ORAL_CAPSULE | Freq: Every morning | ORAL | Status: DC
  Administered 2012-10-08: 200 mg via ORAL
  Filled 2012-10-06 (×3): qty 2

## 2012-10-06 MED ORDER — DULOXETINE HCL 20 MG PO CPEP
20.0000 mg | ORAL_CAPSULE | Freq: Every day | ORAL | Status: DC
  Administered 2012-10-07: 20 mg via ORAL
  Filled 2012-10-06 (×4): qty 1

## 2012-10-06 MED ORDER — PROMETHAZINE HCL 25 MG/ML IJ SOLN
6.2500 mg | INTRAMUSCULAR | Status: DC | PRN
Start: 2012-10-06 — End: 2012-10-06

## 2012-10-06 MED ORDER — GABAPENTIN 300 MG PO CAPS
900.0000 mg | ORAL_CAPSULE | Freq: Four times a day (QID) | ORAL | Status: DC
Start: 2012-10-06 — End: 2012-10-08
  Administered 2012-10-06 – 2012-10-08 (×9): 900 mg via ORAL
  Filled 2012-10-06 (×13): qty 3

## 2012-10-06 MED ORDER — ACETAMINOPHEN 325 MG PO TABS: 650.0000 mg | ORAL_TABLET | Freq: Four times a day (QID) | ORAL | Status: DC | PRN

## 2012-10-06 MED ORDER — VANCOMYCIN HCL IN DEXTROSE 1-5 GM/200ML-% IV SOLN
1000.0000 mg | INTRAVENOUS | Status: AC
  Administered 2012-10-06: 1500 mg via INTRAVENOUS

## 2012-10-06 MED ORDER — LACTATED RINGERS IV SOLN
INTRAVENOUS | Status: DC | PRN
  Administered 2012-10-06: 08:00:00 via INTRAVENOUS

## 2012-10-06 MED ORDER — HYDROMORPHONE HCL PF 1 MG/ML IJ SOLN
1.0000 mg | INTRAMUSCULAR | Status: DC | PRN
  Administered 2012-10-06 – 2012-10-07 (×2): 1 mg via INTRAVENOUS
  Administered 2012-10-07 (×2): 2 mg via INTRAVENOUS
  Administered 2012-10-07 (×3): 1 mg via INTRAVENOUS
  Administered 2012-10-07: 2 mg via INTRAVENOUS
  Administered 2012-10-07: 1 mg via INTRAVENOUS
  Administered 2012-10-08: 2 mg via INTRAVENOUS
  Filled 2012-10-06 (×5): qty 2
  Filled 2012-10-06 (×5): qty 1

## 2012-10-06 MED ORDER — ONDANSETRON HCL 4 MG/2ML IJ SOLN
INTRAMUSCULAR | Status: DC | PRN
  Administered 2012-10-06: 4 mg via INTRAVENOUS

## 2012-10-06 MED ORDER — SUCCINYLCHOLINE CHLORIDE 20 MG/ML IJ SOLN
INTRAMUSCULAR | Status: DC | PRN
  Administered 2012-10-06: 100 mg via INTRAVENOUS

## 2012-10-06 MED ORDER — EPHEDRINE SULFATE 50 MG/ML IJ SOLN
INTRAMUSCULAR | Status: DC | PRN
  Administered 2012-10-06: 10 mg via INTRAVENOUS
  Administered 2012-10-06: 5 mg via INTRAVENOUS

## 2012-10-06 MED ORDER — FENTANYL CITRATE 0.05 MG/ML IJ SOLN
INTRAMUSCULAR | Status: DC | PRN
  Administered 2012-10-06 (×4): 50 ug via INTRAVENOUS
  Administered 2012-10-06: 100 ug via INTRAVENOUS
  Administered 2012-10-06 (×4): 50 ug via INTRAVENOUS

## 2012-10-06 MED ORDER — OXYCODONE-ACETAMINOPHEN 5-325 MG PO TABS
1.0000 | ORAL_TABLET | ORAL | Status: DC | PRN
  Administered 2012-10-06 – 2012-10-08 (×8): 2 via ORAL
  Filled 2012-10-06 (×8): qty 2

## 2012-10-06 MED ORDER — ONDANSETRON HCL 4 MG/2ML IJ SOLN: 4.0000 mg | Freq: Four times a day (QID) | INTRAMUSCULAR | Status: DC | PRN

## 2012-10-06 MED ORDER — ASPIRIN-ACETAMINOPHEN-CAFFEINE 250-250-65 MG PO TABS
1.0000 | ORAL_TABLET | Freq: Four times a day (QID) | ORAL | Status: DC | PRN
  Filled 2012-10-06: qty 1

## 2012-10-06 MED ORDER — SODIUM CHLORIDE 0.9 % IV SOLN
INTRAVENOUS | Status: DC
  Administered 2012-10-06 – 2012-10-08 (×3): via INTRAVENOUS

## 2012-10-06 MED ORDER — HYDROMORPHONE HCL PF 1 MG/ML IJ SOLN
INTRAMUSCULAR | Status: DC | PRN
  Administered 2012-10-06 (×2): 1 mg via INTRAVENOUS

## 2012-10-06 MED ORDER — MEPERIDINE HCL 50 MG/ML IJ SOLN: 6.2500 mg | INTRAMUSCULAR | Status: DC | PRN

## 2012-10-06 MED ORDER — HYDROMORPHONE HCL PF 1 MG/ML IJ SOLN
1.0000 mg | INTRAMUSCULAR | Status: DC | PRN
  Administered 2012-10-06 (×3): 1 mg via INTRAVENOUS
  Filled 2012-10-06 (×3): qty 1

## 2012-10-06 MED ORDER — PROMETHAZINE HCL 25 MG PO TABS: 12.5000 mg | ORAL_TABLET | Freq: Four times a day (QID) | ORAL | Status: DC | PRN

## 2012-10-06 SURGICAL SUPPLY — 51 items
APPLIER CLIP 11 MED OPEN (CLIP) ×4
BENZOIN TINCTURE PRP APPL 2/3 (GAUZE/BANDAGES/DRESSINGS) IMPLANT
BINDER BREAST LRG (GAUZE/BANDAGES/DRESSINGS) ×2 IMPLANT
BINDER BREAST XLRG (GAUZE/BANDAGES/DRESSINGS) IMPLANT
BLADE HEX COATED 2.75 (ELECTRODE) ×2 IMPLANT
BNDG COHESIVE 4X5 TAN STRL (GAUZE/BANDAGES/DRESSINGS) IMPLANT
CANISTER SUCTION 2500CC (MISCELLANEOUS) ×2 IMPLANT
CHLORAPREP W/TINT 26ML (MISCELLANEOUS) ×2 IMPLANT
CLIP APPLIE 11 MED OPEN (CLIP) ×2 IMPLANT
CLOTH BEACON ORANGE TIMEOUT ST (SAFETY) ×2 IMPLANT
CLSR STERI-STRIP ANTIMIC 1/2X4 (GAUZE/BANDAGES/DRESSINGS) ×2 IMPLANT
COVER MAYO STAND STRL (DRAPES) IMPLANT
COVER SURGICAL LIGHT HANDLE (MISCELLANEOUS) ×2 IMPLANT
DERMABOND ADVANCED (GAUZE/BANDAGES/DRESSINGS) ×1
DERMABOND ADVANCED .7 DNX12 (GAUZE/BANDAGES/DRESSINGS) ×1 IMPLANT
DRAIN CHANNEL RND F F (WOUND CARE) ×4 IMPLANT
DRAPE LAPAROSCOPIC ABDOMINAL (DRAPES) ×2 IMPLANT
DRAPE LG THREE QUARTER DISP (DRAPES) ×2 IMPLANT
DRAPE ORTHO SPLIT 77X108 STRL (DRAPES)
DRAPE SURG ORHT 6 SPLT 77X108 (DRAPES) IMPLANT
DRSG PAD ABDOMINAL 8X10 ST (GAUZE/BANDAGES/DRESSINGS) ×2 IMPLANT
ELECT REM PT RETURN 9FT ADLT (ELECTROSURGICAL) ×2
ELECTRODE REM PT RTRN 9FT ADLT (ELECTROSURGICAL) ×1 IMPLANT
EVACUATOR SILICONE 100CC (DRAIN) ×4 IMPLANT
GLOVE BIO SURGEON STRL SZ7 (GLOVE) ×2 IMPLANT
GLOVE BIOGEL PI IND STRL 7.5 (GLOVE) ×1 IMPLANT
GLOVE BIOGEL PI INDICATOR 7.5 (GLOVE) ×1
GOWN PREVENTION PLUS LG XLONG (DISPOSABLE) ×2 IMPLANT
GOWN PREVENTION PLUS XLARGE (GOWN DISPOSABLE) ×2 IMPLANT
GOWN STRL NON-REIN LRG LVL3 (GOWN DISPOSABLE) ×2 IMPLANT
GOWN STRL REIN XL XLG (GOWN DISPOSABLE) ×2 IMPLANT
HEMOSTAT SURGICEL 4X8 (HEMOSTASIS) ×2 IMPLANT
KIT BASIN OR (CUSTOM PROCEDURE TRAY) ×2 IMPLANT
MARKER SKIN DUAL TIP RULER LAB (MISCELLANEOUS) ×2 IMPLANT
NS IRRIG 1000ML POUR BTL (IV SOLUTION) ×2 IMPLANT
PACK GENERAL/GYN (CUSTOM PROCEDURE TRAY) ×2 IMPLANT
SPONGE DRAIN TRACH 4X4 STRL 2S (GAUZE/BANDAGES/DRESSINGS) IMPLANT
SPONGE GAUZE 4X4 12PLY (GAUZE/BANDAGES/DRESSINGS) ×2 IMPLANT
SPONGE LAP 18X18 X RAY DECT (DISPOSABLE) ×6 IMPLANT
STAPLER VISISTAT 35W (STAPLE) ×2 IMPLANT
STOCKINETTE 6  STRL (DRAPES)
STOCKINETTE 6 STRL (DRAPES) IMPLANT
STOCKINETTE 8 INCH (MISCELLANEOUS) IMPLANT
STRIP CLOSURE SKIN 1/2X4 (GAUZE/BANDAGES/DRESSINGS) IMPLANT
SUT ETHILON 2 0 PS N (SUTURE) ×4 IMPLANT
SUT MON AB 5-0 PS2 18 (SUTURE) ×4 IMPLANT
SUT SILK 2 0 SH (SUTURE) ×2 IMPLANT
SUT VIC AB 2-0 BRD 54 (SUTURE) ×2 IMPLANT
SUT VIC AB 3-0 SH 8-18 (SUTURE) ×6 IMPLANT
TOWEL OR 17X26 10 PK STRL BLUE (TOWEL DISPOSABLE) ×2 IMPLANT
TRAY FOLEY CATH 14FRSI W/METER (CATHETERS) IMPLANT

## 2012-10-06 NOTE — Transfer of Care (Signed)
Immediate Anesthesia Transfer of Care Note  Patient: Holly Parrish  Procedure(s) Performed: Procedure(s) (LRB): MASTECTOMY MODIFIED RADICAL WITH AXILLARY CONTENT (Right)  Patient Location: PACU  Anesthesia Type: General  Level of Consciousness: sedated, patient cooperative and responds to stimulaton  Airway & Oxygen Therapy: Patient Spontanous Breathing and Patient connected to face mask oxgen  Post-op Assessment: Report given to PACU RN and Post -op Vital signs reviewed and stable  Post vital signs: Reviewed and stable  Complications: No apparent anesthesia complications

## 2012-10-06 NOTE — Anesthesia Postprocedure Evaluation (Signed)
  Anesthesia Post-op Note  Patient: Stage manager  Procedure(s) Performed: Procedure(s) (LRB): MASTECTOMY MODIFIED RADICAL WITH AXILLARY CONTENT (Right)  Patient Location: PACU  Anesthesia Type: General  Level of Consciousness: awake and alert   Airway and Oxygen Therapy: Patient Spontanous Breathing  Post-op Pain: mild  Post-op Assessment: Post-op Vital signs reviewed, Patient's Cardiovascular Status Stable, Respiratory Function Stable, Patent Airway and No signs of Nausea or vomiting  Last Vitals:  Filed Vitals:   10/06/12 1200  BP: 126/77  Pulse:   Temp:   Resp:     Post-op Vital Signs: stable   Complications: No apparent anesthesia complications

## 2012-10-06 NOTE — Op Note (Addendum)
Preoperative diagnosis: Clinical stage III right breast cancer status post primary systemic chemotherapy Postoperative diagnosis: Same as above Procedure: Right modified radical mastectomy Surgeon: Dr. Dwain Sarna Anesthesia: Gen. Estimated blood loss: 100 cc Drains: 2 19 French Blake drains to mastectomy space and axilla Specimens: #1 right breast and axillary contents marked short stitch superior, lateral stitch marks axillary contents #2 additional axillary contents Complications: None Disposition to recovery room in stable condition Sponge count was correct at end of operation  Indications: This is a 42 year old female with a locally advanced right breast cancer that was node positive upon presentation. She has undergone primary systemic chemotherapy which she said some difficulty with. Her exam still shows that she has thickened skin inferior to the nipple areolar complex. A repeat MRI shows much improvement in her axillary nodes as well as in her tumor but there still was a fairly large low-grade enhancement. I recommended to her after examining her that I think that proceeding with a modified radical mastectomy would be the best option both for control of her primary tumor and to remove her nodes due to the fact she was node positive. She and I discussed this and the risks and benefits associated with and decided to proceed.  Procedure: After informed consent was obtained the patient was taken to the operating room. Her MRSA screen was positive and she received 1 g of vancomycin. Sequential compression devices were on her legs. She was placed under general anesthesia without complication. Her right breast and axilla were prepped and draped in the standard sterile surgical fashion. A surgical timeout was performed.  I made an elliptical incision with an attempt to save as much skin as possible. I did remove some of the thickened skin inferiorly. I then made flaps to the sternum medially,  clavicle superiorly, inframammary crease inferiorly, and latissimus laterally. This was very difficult and on her MRI showed that she still had extension to her anterior pectoralis. There appeared to be a fair amount of scarring to this but the breast and the fascia was able to be removed from the pectoralis muscle. I placed numerous 3-0 Vicryl sutures in vessels. Once I this laterally I entered into the axilla. Her axilla was very scarred and the axillary dissection was very difficult to identify both the nerves and the axillary vein. Eventually I was able to identify the axillary vein and isolate these contents caudad from the vein. The thoracodorsal bundle and the long thoracic nerve were both identified. Both of these were functional upon completion of the operation. I closed all of the tissue in the axilla sweeping this caudad and removed this all as one entire specimen. There was a small amount of tissue still present a right at the base of the axillary vein and the thoracodorsal bundle that I excised as well. No further tissue in her axilla. The passed these off the table as a specimen. I inserted 2 drains as above. I then irrigated copiously. I obtained hemostasis. There was a small area of bleeding right next to the thoracodorsal nerve I placed a piece of Surgicel on as opposed to suturing or cauterizing. There was one small area in the inferior flap was very concerned about what I had to go very thin. I decided to excise this in elliptical fashion. I then closed the larger incision as well as a smaller one is concerned with 3-0 Vicryl for Monocryl. Dermabond were placed on both of these as well as Steri-Strips. The drains were secured with 2-0  nylon. I then placed an ABD pad a breast binder. She was extubated in the operating room and transferred to recovery stable.

## 2012-10-06 NOTE — Anesthesia Preprocedure Evaluation (Addendum)
Anesthesia Evaluation  Patient identified by MRN, date of birth, ID band Patient awake    Reviewed: Allergy & Precautions, H&P , NPO status , Patient's Chart, lab work & pertinent test results, reviewed documented beta blocker date and time   Airway Mallampati: II TM Distance: >3 FB Neck ROM: full    Dental  (+) Teeth Intact   Pulmonary asthma ,  breath sounds clear to auscultation        Cardiovascular negative cardio ROS  Rhythm:regular     Neuro/Psych  Headaches, negative psych ROS   GI/Hepatic negative GI ROS, Neg liver ROS,   Endo/Other  negative endocrine ROS  Renal/GU negative Renal ROS  negative genitourinary   Musculoskeletal   Abdominal   Peds  Hematology negative hematology ROS (+)   Anesthesia Other Findings See surgeon's H&P   Reproductive/Obstetrics negative OB ROS                          Anesthesia Physical  Anesthesia Plan  ASA: II  Anesthesia Plan: General   Post-op Pain Management:    Induction: Intravenous  Airway Management Planned: LMA and Oral ETT  Additional Equipment:   Intra-op Plan:   Post-operative Plan: Extubation in OR  Informed Consent: I have reviewed the patients History and Physical, chart, labs and discussed the procedure including the risks, benefits and alternatives for the proposed anesthesia with the patient or authorized representative who has indicated his/her understanding and acceptance.   Dental Advisory Given  Plan Discussed with: CRNA and Surgeon  Anesthesia Plan Comments:        Anesthesia Quick Evaluation

## 2012-10-06 NOTE — Interval H&P Note (Signed)
History and Physical Interval Note:  10/06/2012 8:27 AM  Holly Parrish  has presented today for surgery, with the diagnosis of right breast cancer  The various methods of treatment have been discussed with the patient and family. After consideration of risks, benefits and other options for treatment, the patient has consented to  Procedure(s): MASTECTOMY MODIFIED RADICAL (Right) as a surgical intervention .  The patient's history has been reviewed, patient examined, no change in status, stable for surgery.  I have reviewed the patient's chart and labs.  Questions were answered to the patient's satisfaction.     WAKEFIELD,MATTHEW

## 2012-10-06 NOTE — Preoperative (Signed)
Beta Blockers   Reason not to administer Beta Blockers:Not Applicable 

## 2012-10-06 NOTE — H&P (View-Only) (Signed)
Patient ID: Holly Parrish, female   DOB: 02-27-1971, 42 y.o.   MRN: [id number]  Chief Complaint  Patient presents with  . Re-evaluation    eval rt breast     HPI Holly Parrish is a 42 y.o. female.   HPI This is a 42 year old female who was diagnosed initially with a locally advanced right breast cancer. Upon presentation she was noted to have a large right breast mass with a satellite nodule and multiple enlarged nodes. 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 She has completed some of her chemotherapy and remains on her Herceptin now. I saw her in the middle of January and send her to get an MRI for followup. She has not returned until today despite some of our best effort to get her in. She reports no real changes since her last visit he comes in to review her MRI as well as possible future treatment for breast cancer. Her MRI is below and still shows a 6 x 3.9 x 1.6 cm area in place where the 2 adjacent masses were before that is irregular and has low-grade enhancement. This is still in contact with the anterior aspect of her pectoralis major. The right breast remains larger than left breast and there is diffuse skin thickening and enhancement as well. There were no additional masses or areas of enhancement suspicious. Lymph nodes were all much better. Past Medical History  Diagnosis Date  . Lupus   . Joint pain   . Hearing loss   . Bleeding nose   . Bilateral swelling of feet   . Breast cancer 40  . Cancer 37    Cervical, treated with cryoablation  . Migraines   . Headache   . Asthma   . Neuromuscular disorder     Past Surgical History  Procedure Laterality Date  . Tonsillectomy    . Cholecystectomy    . 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 Portacath placement  03/11/2012    Procedure: INSERTION PORT-A-CATH;  Surgeon: Emelia Loron, MD;  Location: [facility];  Service: General;  Laterality: Left;  . Hand surgery   1997  . Appendectomy  1995  . Abdominal exploration surgery  [id number]  . Tubal ligation  1997    Family History  Problem Relation Age of Onset  . Uterine cancer Mother 69  . Pancreatic cancer Paternal Uncle     diagnosed in his 66s; smoker; half uncle related through grandmother  . Cancer Paternal Uncle     Throat  . Liver cancer Maternal Grandmother     not a drinker  . Cancer Maternal Grandmother   . Breast cancer Cousin     3 paternal cousins with breast cancer, onset 78, 49 and one bilateral at  80 and 27  . Cancer Cousin     Breast  . Heart attack Father 76  . Breast cancer Paternal Grandmother 61  . Heart attack Paternal Grandfather   . Throat cancer Paternal Aunt     smoker; half uncle related through grandmother  . Cystic fibrosis Cousin   . Cancer Cousin     Breast  . Cancer Cousin     Breast    Social History History  Substance Use Topics  . Smoking status: Never Smoker   . Smokeless tobacco: Never Used  . Alcohol Use: Yes     Comment: occasional drinker    Allergies  Allergen Reactions  . Nsaids Anaphylaxis  .  Penicillins Anaphylaxis  . Tetanus Toxoids Anaphylaxis  . Hydrogen Peroxide Other (See Comments)    "It burns until I bleed."    Current Outpatient Prescriptions  Medication Sig Dispense Refill  . dexamethasone (DECADRON) 4 MG tablet TAKE 2 TABLETS BY MOUTH 2 TIMES A DAY THE DAY BEFORE TAXOTERE-THEN TAKE 2TABS 2 TIMES A DAY STARTING DAY AFTER CHEMO FOR 3DAYS  30 tablet  2  . furosemide (LASIX) 20 MG tablet Take 20 mg by mouth as needed.      . gabapentin (NEURONTIN) 300 MG capsule Take 600 mg by mouth daily.      Marland Kitchen lidocaine-prilocaine (EMLA) cream Apply topically as needed.  30 g  0  . LORazepam (ATIVAN) 0.5 MG tablet Take 1 tablet (0.5 mg total) by mouth every 6 (six) hours as needed for anxiety.  30 tablet  1  . ondansetron (ZOFRAN) 8 MG tablet Take 1 tablet (8 mg total) by mouth every 12 (twelve) hours as needed for nausea.  20 tablet  0   . oxyCODONE (OXY IR/ROXICODONE) 5 MG immediate release tablet Take 2 tablets (10 mg total) by mouth daily with breakfast. As needed for pain  60 tablet  0  . oxyCODONE-acetaminophen (ROXICET) 5-325 MG per tablet 1-2 tabs PO Q 6 hrs PRN pain.  Maximum of 8 tablets in 24 hours.  60 tablet  0  . potassium chloride (K-DUR) 10 MEQ tablet Take 20 mEq by mouth once. Only when taking lasix      . promethazine (PHENERGAN) 25 MG tablet Take 1 tablet by mouth every 6 to 8 hrs as needed for nausea or vomiting  30 tablet  0  . sertraline (ZOLOFT) 50 MG tablet Take 1 tablet (50 mg total) by mouth daily.  30 tablet  2  . traMADol (ULTRAM) 50 MG tablet Take 1 tablet (50 mg total) by mouth every 6 (six) hours as needed for pain.  30 tablet  3   No current facility-administered medications for this visit.    Review of Systems Review of Systems  Constitutional: Negative for fever, chills and unexpected weight change.  HENT: Negative for hearing loss, congestion, sore throat, trouble swallowing and voice change.   Eyes: Negative for visual disturbance.  Respiratory: Negative for cough and wheezing.   Cardiovascular: Negative for chest pain, palpitations and leg swelling.  Gastrointestinal: Negative for nausea, vomiting, abdominal pain, diarrhea, constipation, blood in stool, abdominal distention and anal bleeding.  Genitourinary: Negative for hematuria, vaginal bleeding and difficulty urinating.  Musculoskeletal: Negative for arthralgias.  Skin: Negative for rash and wound.  Neurological: Negative for seizures, syncope and headaches.  Hematological: Negative for adenopathy. Does not bruise/bleed easily.  Psychiatric/Behavioral: Negative for confusion.    Blood pressure 102/58, pulse 76, temperature 97.3 F (36.3 C), temperature source Temporal, resp. rate 16, height 5\' 2"  (1.575 m), weight 206 lb (93.441 kg).  Physical Exam Physical Exam  Vitals reviewed. Constitutional: She appears well-developed and  well-nourished.  Cardiovascular: Normal rate, regular rhythm and normal heart sounds.   Pulmonary/Chest: Effort normal and breath sounds normal. She has no wheezes. She has no rales. Right breast exhibits skin change (skin thickening). Right breast exhibits no inverted nipple, no mass, no nipple discharge and no tenderness. Left breast exhibits no inverted nipple, no mass, no nipple discharge, no skin change and no tenderness. Breasts are symmetrical.  Lymphadenopathy:    She has no cervical adenopathy.    She has no axillary adenopathy.       Right:  No supraclavicular adenopathy present.       Left: No supraclavicular adenopathy present.    Data Reviewed BILATERAL BREAST MRI WITH AND WITHOUT CONTRAST  Technique: Multiplanar, multisequence MR images of both breasts  were obtained prior to and following the intravenous administration  of 18ml of MultiHance. Three dimensional images were evaluated at  the independent DynaCad workstation.  Comparison: 03/02/2012.  Findings: Minimal background parenchymal enhancement in both  breasts.  Elongated, irregularly marginated area of low grade enhancement in  the middle and posterior thirds of the lower outer quadrant of the  right breast containing a biopsy marker clip artifact anteriorly.  This is at the location of the previously demonstrated two adjacent  masses and measures 6.0 x 3.9 x 1.6 cm in maximum dimensions. The  previously seen masses in that area are no longer demonstrated.  The low grade enhancement in this area has persistent kinetics.  This is in contact with the anterior aspect of the pectoralis major  muscle on the right without extension into the muscle. The right  breast remains larger than the left breast with diffuse skin  thickening and enhancement.  No additional masses or areas enhancement suspicious for malignancy  in either breast.  The previously demonstrated multiple enlarged right axillary lymph  nodes are  significantly smaller. On image number 190 of the first  post contrast sequence, one of the more laterally located lymph  nodes has a short axis diameter of 0.7 cm. This previously had a  short axis diameter of 2.4 cm.  IMPRESSION:  1. 6.0 x 3.9 x 1.6 cm elongated area of scarring in the lower  outer quadrant of the right breast at the location of the  previously demonstrated two adjacent masses. Those masses are no  longer seen. This extends to the pectoralis major muscle without  extension into the muscle.  2. Significantly improved metastatic right axillary adenopathy.  The lymph nodes in the right axilla remain mildly asymmetrically  enlarged and increased in number compared to the left.  3. No new findings suspicious for malignancy elsewhere in either  breast and no new adenopathy.   Assessment   Node positive locally advanced right breast cancer s/p primary chemotherapy    Plan    Right MRM We had a long discussion today about her options for surgery. I'm going to discuss this with her medical oncologist also. I've reviewed her MRI as well as all of her prior information. I have examined her today as well. I'm concerned on her exam that she still has some skin thickening. This also shows on her MRI and there still is a fairly decent size area of low-grade enhancement in that breast. I think this it was going to be difficult to convert her to bct with her breast size her tumor size to begin with. I think for the treatment of her breast cancer the best option at this point is to proceed with a right modified radical mastectomy given the MRI, her exam, and preoperative evaluation. Her nodes were positive upon initial presentation and this would result an axillary lymph node dissection.  We discussed the hospital stay being 1-2 days. We discussed her recovery. We discussed the issues of drains.  I told her that the risks include but are not limited to bleeding, return to the operating  room for hematoma evacuation, infection, chronic shoulder pain, lymphedema with tricortical wrist up to 50%. She understands all of this and will try to proceed as soon as  possible.        Caelan Atchley 09/24/2012, 10:31 AM

## 2012-10-07 ENCOUNTER — Encounter (HOSPITAL_COMMUNITY): Payer: Self-pay | Admitting: General Surgery

## 2012-10-07 LAB — BASIC METABOLIC PANEL
BUN: 17 mg/dL (ref 6–23)
Chloride: 104 mEq/L (ref 96–112)
Creatinine, Ser: 0.75 mg/dL (ref 0.50–1.10)
GFR calc Af Amer: >90 mL/min (ref >90)
GFR calc non Af Amer: >90 mL/min (ref >90)
Glucose, Bld: 127 mg/dL — ABNORMAL HIGH (ref 70–99)

## 2012-10-07 LAB — CBC
HCT: 33.3 % — ABNORMAL LOW (ref 36.0–46.0)
MCH: 34.3 pg — ABNORMAL HIGH (ref 26.0–34.0)
MCHC: 33.9 g/dL (ref 30.0–36.0)
RDW: 13 % (ref 11.5–15.5)

## 2012-10-07 MED ORDER — DIAZEPAM 2 MG PO TABS
2.0000 mg | ORAL_TABLET | Freq: Three times a day (TID) | ORAL | Status: DC | PRN
  Administered 2012-10-08: 2 mg via ORAL
  Filled 2012-10-07: qty 1

## 2012-10-07 NOTE — Care Management Note (Signed)
    Page 1 of 1   10/07/2012     11:19:41 AM   CARE MANAGEMENT NOTE 10/07/2012  Patient:  Holly Parrish, Holly Parrish   Account Number:  [id number]  Date Initiated:  10/07/2012  Documentation initiated by:  Lorenda Ishihara  Subjective/Objective Assessment:   42 yo female admitted s/p mastectomy. PTA lived at home with family.     Action/Plan:   Home when stable   Anticipated DC Date:  10/08/2012   Anticipated DC Plan:  HOME/SELF CARE      DC Planning Services  CM consult      Choice offered to / List presented to:             Status of service:  Completed, signed off Medicare Important Message given?  NA - LOS <3 / Initial given by admissions (If response is "NO", the following Medicare IM given date fields will be blank) Date Medicare IM given:   Date Additional Medicare IM given:    Discharge Disposition:  HOME/SELF CARE  Per UR Regulation:  Reviewed for med. necessity/level of care/duration of stay  If discussed at Long Length of Stay Meetings, dates discussed:    Comments:

## 2012-10-07 NOTE — Progress Notes (Signed)
1 Day Post-Op  Subjective: Pain in back like muscle spasms, has been up and around  Objective: Vital signs in last 24 hours: Temp:  [96.8 F (36 C)-99.1 F (37.3 C)] 98.6 F (37 C) (03/13 0500) Pulse Rate:  [64-105] 80 (03/13 0500) Resp:  [13-19] 16 (03/13 0500) BP: (96-140)/(62-86) 96/62 mmHg (03/13 0500) SpO2:  [90 %-100 %] 93 % (03/13 0500) Weight:  [209 lb 6 oz (94.972 kg)] 209 lb 6 oz (94.972 kg) (03/12 1244) Last BM Date: 10/05/12  Intake/Output from previous day: 03/12 0701 - 03/13 0700 In: 3980.3 [P.O.:804; I.V.:3176.3] Out: 1620 [Urine:1200; Drains:370; Blood:50] Intake/Output this shift:    General appearance: no distress Incision/Wound:mastectomy incision clean without infection, no hematoma, drains with expected output  Lab Results:   Recent Labs  10/07/12 0401  WBC 10.2  HGB 11.3*  HCT 33.3*  PLT 429*   BMET  Recent Labs  10/07/12 0401  NA 137  K 4.3  CL 104  CO2 25  GLUCOSE 127*  BUN 17  CREATININE 0.75  CALCIUM 8.6    Anti-infectives: Anti-infectives   Start     Dose/Rate Route Frequency Ordered Stop   10/06/12 0609  vancomycin (VANCOCIN) IVPB 1000 mg/200 mL premix     1,000 mg 200 mL/hr over 60 Minutes Intravenous On call to O.R. 10/06/12 4540 10/06/12 0835   10/06/12 0600  vancomycin (VANCOCIN) 1,500 mg in sodium chloride 0.9 % 500 mL IVPB  Status:  Discontinued     1,500 mg 250 mL/hr over 120 Minutes Intravenous On call to O.R. 10/05/12 1702 10/06/12 9811      Assessment/Plan: POD 1 right mrm 1. Pain control an issue, will continue dilaudid, percocet, add valium for muscle spasms she cannot take muscle relaxants 2. pulm toilet, oob 3. Drain teaching 4. Plan home in am  [facility] 10/07/2012

## 2012-10-07 NOTE — Clinical Documentation Improvement (Signed)
GENERIC DOCUMENTATION CLARIFICATION QUERY  THIS DOCUMENT IS NOT A PERMANENT PART OF THE MEDICAL RECORD  TO RESPOND TO THE THIS QUERY, FOLLOW THE INSTRUCTIONS BELOW:  1. If needed, update documentation for the patient's encounter via the notes activity.  2. Access this query again and click edit on the In [employer].  3. After updating, or not, click F2 to complete all highlighted (required) fields concerning your review. Select "additional documentation in the medical record" OR "no additional documentation provided".  4. Click Sign note button.  5. The deficiency will fall out of your In Basket *Please let us know if you are not able to complete this workflow by phone or e-mail (listed below).  Please update your documentation within the medical record to reflect your response to this query.                                                                                        10/07/12   Dear Dr. Dwain Sarna / Associates,  In a better effort to capture your patient's severity of illness, reflect appropriate length of stay and utilization of resources, a review of the patient medical record has revealed the following indicators.    Based on your clinical judgment, please clarify and document in a progress note and/or discharge summary the clinical condition associated with the following supporting information:  In responding to this query please exercise your independent judgment.  The fact that a query is asked, does not imply that any particular answer is desired or expected.  Noted in OP NOTE dated 10/06/12 "Indications: This is a 42 year old female with a locally advanced left breast cancer that was node positive upon presentation"  Procedure: Right modified radical mastectomy  Please note the correct surgical site on OP NOTE . Thank you.     You may use possible, probable, or suspect with inpatient documentation. possible, probable, suspected diagnoses MUST be documented at the  time of discharge  Reviewed: additional documentation in the medical record  Thank You,  Andy Gauss RN, BSN  Clinical Documentation Specialist:  Pager # [phone] Health Information Management [facility]

## 2012-10-07 NOTE — Progress Notes (Signed)
Pt demonstrated how to empty and recharge JP drains appropriately and feels comfortable taking care of drains at discharge. Holly Parrish

## 2012-10-08 MED ORDER — DIAZEPAM 2 MG PO TABS: 2.0000 mg | ORAL_TABLET | Freq: Three times a day (TID) | ORAL | Status: DC | PRN

## 2012-10-08 MED ORDER — OXYCODONE-ACETAMINOPHEN 5-325 MG PO TABS: 1.0000 | ORAL_TABLET | ORAL | Status: DC | PRN

## 2012-10-08 MED FILL — Bacitracin Zinc Oint 500 Unit/GM: CUTANEOUS | Qty: 15 | Status: AC

## 2012-10-08 NOTE — Progress Notes (Signed)
2 Days Post-Op  Subjective: Pain much better wants to go home tol diet, ambulating  Objective: Vital signs in last 24 hours: Temp:  [98.3 F (36.8 C)-98.8 F (37.1 C)] 98.6 F (37 C) (03/14 0630) Pulse Rate:  [60-76] 60 (03/14 0630) Resp:  [16-18] 16 (03/14 0630) BP: (90-96)/(61-67) 96/66 mmHg (03/14 0630) SpO2:  [94 %-95 %] 95 % (03/14 0630) Last BM Date: 10/05/12  Intake/Output from previous day: 03/13 0701 - 03/14 0700 In: 669.8 [I.V.:669.8] Out: 1010 [Urine:800; Drains:210] Intake/Output this shift:    General appearance: no distress Incision/Wound:mastectomy incision clean without infection, no hematoma, drains with expected output  Lab Results:   Recent Labs  10/07/12 0401  WBC 10.2  HGB 11.3*  HCT 33.3*  PLT 429*   BMET  Recent Labs  10/07/12 0401  NA 137  K 4.3  CL 104  CO2 25  GLUCOSE 127*  BUN 17  CREATININE 0.75  CALCIUM 8.6   Assessment/Plan: POD 2 right mrm Dc home today  [facility] 10/08/2012

## 2012-10-11 ENCOUNTER — Observation Stay (HOSPITAL_COMMUNITY): Payer: Medicare Other

## 2012-10-11 ENCOUNTER — Telehealth (INDEPENDENT_AMBULATORY_CARE_PROVIDER_SITE_OTHER): Payer: Self-pay

## 2012-10-11 ENCOUNTER — Observation Stay (HOSPITAL_COMMUNITY)
Admission: EM | Admit: 2012-10-11 | Discharge: 2012-10-13 | Disposition: A | Discharge status: Home / Self Care | Payer: Medicare Other | Source: Ambulatory Visit | Attending: General Surgery | Admitting: General Surgery

## 2012-10-11 ENCOUNTER — Other Ambulatory Visit: Payer: Self-pay | Admitting: Oncology

## 2012-10-11 ENCOUNTER — Emergency Department (HOSPITAL_COMMUNITY): Payer: Medicare Other

## 2012-10-11 ENCOUNTER — Encounter (HOSPITAL_COMMUNITY): Payer: Self-pay

## 2012-10-11 DIAGNOSIS — Z9010 Acquired absence of unspecified breast and nipple: Secondary | ICD-10-CM | POA: Insufficient documentation

## 2012-10-11 DIAGNOSIS — R071 Chest pain on breathing: Secondary | ICD-10-CM | POA: Insufficient documentation

## 2012-10-11 DIAGNOSIS — R5381 Other malaise: Secondary | ICD-10-CM | POA: Insufficient documentation

## 2012-10-11 DIAGNOSIS — R5082 Postprocedural fever: Secondary | ICD-10-CM | POA: Insufficient documentation

## 2012-10-11 DIAGNOSIS — C50919 Malignant neoplasm of unspecified site of unspecified female breast: Secondary | ICD-10-CM | POA: Insufficient documentation

## 2012-10-11 DIAGNOSIS — G5790 Unspecified mononeuropathy of unspecified lower limb: Secondary | ICD-10-CM | POA: Insufficient documentation

## 2012-10-11 DIAGNOSIS — Z79899 Other long term (current) drug therapy: Secondary | ICD-10-CM | POA: Insufficient documentation

## 2012-10-11 DIAGNOSIS — F329 Major depressive disorder, single episode, unspecified: Secondary | ICD-10-CM

## 2012-10-11 DIAGNOSIS — G629 Polyneuropathy, unspecified: Secondary | ICD-10-CM

## 2012-10-11 DIAGNOSIS — M329 Systemic lupus erythematosus, unspecified: Secondary | ICD-10-CM | POA: Insufficient documentation

## 2012-10-11 DIAGNOSIS — Z8049 Family history of malignant neoplasm of other genital organs: Secondary | ICD-10-CM | POA: Insufficient documentation

## 2012-10-11 DIAGNOSIS — G8918 Other acute postprocedural pain: Principal | ICD-10-CM | POA: Insufficient documentation

## 2012-10-11 DIAGNOSIS — C50511 Malignant neoplasm of lower-outer quadrant of right female breast: Secondary | ICD-10-CM

## 2012-10-11 DIAGNOSIS — IMO0001 Reserved for inherently not codable concepts without codable children: Secondary | ICD-10-CM | POA: Insufficient documentation

## 2012-10-11 DIAGNOSIS — R112 Nausea with vomiting, unspecified: Secondary | ICD-10-CM | POA: Insufficient documentation

## 2012-10-11 DIAGNOSIS — Z803 Family history of malignant neoplasm of breast: Secondary | ICD-10-CM | POA: Insufficient documentation

## 2012-10-11 DIAGNOSIS — Z8541 Personal history of malignant neoplasm of cervix uteri: Secondary | ICD-10-CM | POA: Insufficient documentation

## 2012-10-11 LAB — URINALYSIS, ROUTINE W REFLEX MICROSCOPIC
Glucose, UA: NEGATIVE mg/dL
Hgb urine dipstick: NEGATIVE
Ketones, ur: NEGATIVE mg/dL
Protein, ur: NEGATIVE mg/dL

## 2012-10-11 LAB — BASIC METABOLIC PANEL
BUN: 14 mg/dL (ref 6–23)
CO2: 26 mEq/L (ref 19–32)
Calcium: 8.9 mg/dL (ref 8.4–10.5)
Creatinine, Ser: 0.9 mg/dL (ref 0.50–1.10)
Glucose, Bld: 95 mg/dL (ref 70–99)

## 2012-10-11 LAB — CBC WITH DIFFERENTIAL/PLATELET
Basophils Absolute: 0 10*3/uL (ref 0.0–0.1)
Basophils Relative: 0 % (ref 0–1)
Eosinophils Absolute: 0.1 10*3/uL (ref 0.0–0.7)
Eosinophils Relative: 1 % (ref 0–5)
Lymphs Abs: 3.1 10*3/uL (ref 0.7–4.0)
MCH: 34.2 pg — ABNORMAL HIGH (ref 26.0–34.0)
Neutrophils Relative %: 62 % (ref 43–77)
Platelets: 611 10*3/uL — ABNORMAL HIGH (ref 150–400)
RBC: 3.39 MIL/uL — ABNORMAL LOW (ref 3.87–5.11)
RDW: 13 % (ref 11.5–15.5)

## 2012-10-11 LAB — URINE MICROSCOPIC-ADD ON

## 2012-10-11 MED ORDER — SODIUM CHLORIDE 0.9 % IV BOLUS (SEPSIS)
1000.0000 mL | Freq: Once | INTRAVENOUS | Status: AC
  Administered 2012-10-11: 1000 mL via INTRAVENOUS

## 2012-10-11 MED ORDER — ACETAMINOPHEN 325 MG PO TABS: 650.0000 mg | ORAL_TABLET | Freq: Four times a day (QID) | ORAL | Status: DC | PRN

## 2012-10-11 MED ORDER — ACETAMINOPHEN 650 MG RE SUPP: 650.0000 mg | Freq: Four times a day (QID) | RECTAL | Status: DC | PRN

## 2012-10-11 MED ORDER — GABAPENTIN 300 MG PO CAPS
900.0000 mg | ORAL_CAPSULE | Freq: Four times a day (QID) | ORAL | Status: DC
Start: 2012-10-11 — End: 2012-10-13
  Administered 2012-10-11 – 2012-10-13 (×6): 900 mg via ORAL
  Filled 2012-10-11 (×10): qty 3

## 2012-10-11 MED ORDER — PROMETHAZINE HCL 25 MG PO TABS: 25.0000 mg | ORAL_TABLET | Freq: Four times a day (QID) | ORAL | Status: DC | PRN

## 2012-10-11 MED ORDER — IOHEXOL 300 MG/ML  SOLN
100.0000 mL | Freq: Once | INTRAMUSCULAR | Status: AC | PRN
  Administered 2012-10-11: 80 mL via INTRAVENOUS

## 2012-10-11 MED ORDER — MORPHINE SULFATE 2 MG/ML IJ SOLN
1.0000 mg | INTRAMUSCULAR | Status: DC | PRN
  Administered 2012-10-11 – 2012-10-13 (×11): 2 mg via INTRAVENOUS
  Filled 2012-10-11 (×12): qty 1

## 2012-10-11 MED ORDER — ONDANSETRON HCL 4 MG/2ML IJ SOLN
4.0000 mg | INTRAMUSCULAR | Status: AC
  Administered 2012-10-11: 4 mg via INTRAVENOUS
  Filled 2012-10-11: qty 2

## 2012-10-11 MED ORDER — CIPROFLOXACIN IN D5W 400 MG/200ML IV SOLN
400.0000 mg | Freq: Two times a day (BID) | INTRAVENOUS | Status: DC
  Administered 2012-10-11 – 2012-10-12 (×2): 400 mg via INTRAVENOUS
  Filled 2012-10-11 (×2): qty 200

## 2012-10-11 MED ORDER — ONDANSETRON HCL 4 MG/2ML IJ SOLN: 4.0000 mg | Freq: Four times a day (QID) | INTRAMUSCULAR | Status: DC | PRN

## 2012-10-11 MED ORDER — OXYCODONE HCL 5 MG PO TABS
5.0000 mg | ORAL_TABLET | ORAL | Status: DC | PRN
  Administered 2012-10-11: 10 mg via ORAL
  Administered 2012-10-12: 15 mg via ORAL
  Filled 2012-10-11: qty 2
  Filled 2012-10-11: qty 3

## 2012-10-11 MED ORDER — DIPHENHYDRAMINE HCL 12.5 MG/5ML PO ELIX: 12.5000 mg | ORAL_SOLUTION | Freq: Four times a day (QID) | ORAL | Status: DC | PRN

## 2012-10-11 MED ORDER — OXYCODONE HCL 5 MG PO TABS: 5.0000 mg | ORAL_TABLET | ORAL | Status: DC | PRN

## 2012-10-11 MED ORDER — DIPHENHYDRAMINE HCL 50 MG/ML IJ SOLN: 12.5000 mg | Freq: Four times a day (QID) | INTRAMUSCULAR | Status: DC | PRN

## 2012-10-11 MED ORDER — PANTOPRAZOLE SODIUM 40 MG IV SOLR
40.0000 mg | Freq: Every day | INTRAVENOUS | Status: DC
  Administered 2012-10-11: 40 mg via INTRAVENOUS
  Filled 2012-10-11 (×2): qty 40

## 2012-10-11 MED ORDER — HEPARIN SODIUM (PORCINE) 5000 UNIT/ML IJ SOLN
5000.0000 [IU] | Freq: Three times a day (TID) | INTRAMUSCULAR | Status: DC
  Administered 2012-10-12 – 2012-10-13 (×3): 5000 [IU] via SUBCUTANEOUS
  Filled 2012-10-11 (×8): qty 1

## 2012-10-11 MED ORDER — DOCUSATE SODIUM 100 MG PO CAPS
200.0000 mg | ORAL_CAPSULE | Freq: Every morning | ORAL | Status: DC
  Administered 2012-10-12 – 2012-10-13 (×2): 200 mg via ORAL
  Filled 2012-10-11 (×2): qty 2

## 2012-10-11 MED ORDER — ENOXAPARIN SODIUM 40 MG/0.4ML ~~LOC~~ SOLN: 40.0000 mg | SUBCUTANEOUS | Status: DC

## 2012-10-11 MED ORDER — ONDANSETRON HCL 4 MG PO TABS: 8.0000 mg | ORAL_TABLET | Freq: Two times a day (BID) | ORAL | Status: DC | PRN

## 2012-10-11 MED ORDER — MORPHINE SULFATE 2 MG/ML IJ SOLN: 1.0000 mg | INTRAMUSCULAR | Status: DC | PRN

## 2012-10-11 MED ORDER — SODIUM CHLORIDE 0.9 % IV SOLN
INTRAVENOUS | Status: DC
  Administered 2012-10-11 – 2012-10-13 (×2): via INTRAVENOUS

## 2012-10-11 MED ORDER — HYDROMORPHONE HCL PF 1 MG/ML IJ SOLN
1.0000 mg | Freq: Once | INTRAMUSCULAR | Status: AC
  Administered 2012-10-11: 1 mg via INTRAVENOUS
  Filled 2012-10-11: qty 1

## 2012-10-11 NOTE — ED Provider Notes (Signed)
History     CSN: [id number]  Arrival date & time 10/11/12  1009   First MD Initiated Contact with Patient 10/11/12 1015      Chief Complaint  Patient presents with  . Breast Pain    Rt breast (mastectomy last week 10/06/12), right armpit, and sternum    (Consider location/radiation/quality/duration/timing/severity/associated sxs/prior treatment) HPI Comments: Patient is a 42 year old female with a past medical history of lupus, myopathy, breast cancer, and cervical cancer, status post right mastectomy x5 days who presents complaining of a fever that began last night. Patient states her temperature ranged from 100-102F. Patient was given Roxicet at surgical discharge and states she took this to try and help her pain and fever without relief. Patient states she has been diaphoretic and had the chills last night. She also admits to associated nausea with 1 episode of nonbloody nonbilious emesis last night, generalized weakness and right-sided chest wall pain which she attributes to her surgery. Patient denies headache, vision changes, shortness of breath, abdominal pain, diarrhea, dysuria, hematuria, and numbness or tingling in her lower extremities. Patient has been worked up in the past for lower extremity swelling with venous ultrasound which has been negative; was last evaluated in December 2013. Patient has a history of myopathy and states she has some discomfort in her calves when walking, but that she has had this for some time and does not feel it has worsened since her surgery. 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 Patient has two bulb drains, draining her surgical site which are intact with an approximate 60 cc serous sanguinous fluid, total. Patient also has a Port-A-Cath in her L superior chest. Patient's breast surgeon was Dr. Dwain Sarna.  The history is provided by the patient. No language interpreter was used.    Past Medical History   Diagnosis Date  . Lupus   . Joint pain   . Hearing loss   . Bilateral swelling of feet   . Breast cancer 40  . Cancer 37    Cervical, treated with cryoablation  . Asthma   . Neuromuscular disorder   . Migraines   . Headache   . Arthritis     Past Surgical History  Procedure Laterality Date  . Tonsillectomy    . Cholecystectomy    . 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 Portacath placement  03/11/2012    Procedure: INSERTION PORT-A-CATH;  Surgeon: Emelia Loron, MD;  Location: [facility];  Service: General;  Laterality: Left;  . Hand surgery  1997  . Appendectomy  1995  . Abdominal exploration surgery  [id number]  . Tubal ligation  1997  . Mastectomy modified radical Right 10/06/2012    Procedure: MASTECTOMY MODIFIED RADICAL WITH AXILLARY CONTENT;  Surgeon: Emelia Loron, MD;  Location: [facility];  Service: General;  Laterality: Right;    Family History  Problem Relation Age of Onset  . Uterine cancer Mother 12  . Pancreatic cancer Paternal Uncle     diagnosed in his 26s; smoker; half uncle related through grandmother  . Cancer Paternal Uncle     Throat  . Liver cancer Maternal Grandmother     not a drinker  . Cancer Maternal Grandmother   . Breast cancer Cousin     3 paternal cousins with breast cancer, onset 62, 54 and one bilateral at  59 and 58  . Cancer Cousin     Breast  .  Heart attack Father 39  . Breast cancer Paternal Grandmother 42  . Heart attack Paternal Grandfather   . Throat cancer Paternal Aunt     smoker; half uncle related through grandmother  . Breast cancer Paternal Aunt     2 paternal aunts, both with 2 diagnoses of breast cancer each  . Cystic fibrosis Cousin   . Cancer Cousin     Breast  . Cancer Cousin     Breast  . Breast cancer Paternal Aunt     History  Substance Use Topics  . Smoking status: Never Smoker   . Smokeless tobacco: Never Used  . Alcohol Use: Yes     Comment: occasional drinker    OB History    Grav Para Term Preterm Abortions TAB SAB Ect Mult Living                  Review of Systems  Constitutional: Positive for fever, chills and diaphoresis.  HENT: Negative for neck pain and neck stiffness.   Eyes: Negative for visual disturbance.  Respiratory: Negative for shortness of breath and wheezing.        + chest wall pain around surgical site  Gastrointestinal: Positive for nausea and vomiting. Negative for abdominal pain and diarrhea.  Genitourinary: Negative for dysuria, urgency and hematuria.  Skin: Negative for color change.       + surgical wound site  Neurological: Positive for weakness. Negative for syncope and numbness.  All other systems reviewed and are negative.    Allergies  Bee venom; Nsaids; Penicillins; Tetanus toxoids; Vitamin k and related; Hydrogen peroxide; and Banana  Home Medications   Current Outpatient Rx  Name  Route  Sig  Dispense  Refill  . B Complex Vitamins (VITAMIN B COMPLEX PO)   Oral   Take 1 tablet by mouth daily.          Marland Kitchen docusate sodium (COLACE) 100 MG capsule   Oral   Take 200 mg by mouth every morning.         Marland Kitchen EPINEPHrine (EPIPEN 2-PAK) 0.3 mg/0.3 mL DEVI   Intramuscular   Inject 0.3 mg into the muscle once. Bee venom         . gabapentin (NEURONTIN) 300 MG capsule   Oral   Take 3 capsules (900 mg total) by mouth 4 (four) times daily.   360 capsule   1   . lidocaine-prilocaine (EMLA) cream   Topical   Apply topically as needed.   30 g   0   . ondansetron (ZOFRAN) 8 MG tablet   Oral   Take 1 tablet (8 mg total) by mouth every 12 (twelve) hours as needed for nausea.   20 tablet   0   . oxyCODONE-acetaminophen (ROXICET) 5-325 MG per tablet   Oral   Take 1 tablet by mouth every 4 (four) hours as needed for pain.   30 tablet   0   . PRESCRIPTION MEDICATION   Intravenous   Inject into the vein every 30 (thirty) days. trastuzumab (HERCEPTIN) 546 mg in sodium chloride 0.9 % 250 mL chemo infusion   6 mg/kg,  552 mL/hr         . promethazine (PHENERGAN) 25 MG tablet      Take 1 tablet by mouth every 6 to 8 hrs as needed for nausea or vomiting   30 tablet   0     BP 105/70  Pulse 86  Temp(Src) 99.8 F (37.7 C) (Oral)  Resp 24  SpO2 95%  LMP 03/02/2012  Physical Exam  Nursing note and vitals reviewed. Constitutional: She is oriented to person, place, and time. She appears well-developed and well-nourished. No distress.  Patient uncomfortable; emotional and teary secondary to patient's discomfort and pain.  HENT:  Head: Normocephalic and atraumatic.  Mouth/Throat: Oropharynx is clear and moist. No oropharyngeal exudate.  Eyes: Conjunctivae and EOM are normal. Pupils are equal, round, and reactive to light. Right eye exhibits no discharge. Left eye exhibits no discharge. No scleral icterus.  Neck: Normal range of motion. Neck supple.  Cardiovascular: Regular rhythm, normal heart sounds and intact distal pulses.   +tachycardic; distal radial, posterior tibial, and dorsal pedis pulses 2+ bilaterally  Pulmonary/Chest: Effort normal and breath sounds normal. No respiratory distress. She has no wheezes. She has no rales. She exhibits tenderness.  Abdominal: Soft. Bowel sounds are normal. She exhibits no distension. There is no tenderness. There is no rebound and no guarding.  Musculoskeletal: Normal range of motion. She exhibits no tenderness.  1+ pitting edema in her b/l lower extremities which patient states is her usual baseline.  Lymphadenopathy:    She has no cervical adenopathy.  Neurological: She is alert and oriented to person, place, and time.  Skin: No rash noted. She is diaphoretic. No pallor.  Surgical incision site without erythema, swelling, warmth, or puslike drainage. Drain insertion site also without erythema, swelling, warmth, or puslike drainage. Patient has some resolving ecchymosis on her right chest at the level of the axilla.  Psychiatric: She has a normal mood and  affect. Her behavior is normal.    ED Course  Procedures (including critical care time)  Labs Reviewed  CBC WITH DIFFERENTIAL - Abnormal; Notable for the following:    WBC 11.4 (*)    RBC 3.39 (*)    Hemoglobin 11.6 (*)    HCT 34.2 (*)    MCV 100.9 (*)    MCH 34.2 (*)    Platelets 611 (*)    Monocytes Absolute 1.1 (*)    All other components within normal limits  BASIC METABOLIC PANEL - Abnormal; Notable for the following:    GFR calc non Af Amer 77 (*)    GFR calc Af Amer 89 (*)    All other components within normal limits  URINALYSIS, ROUTINE W REFLEX MICROSCOPIC - Abnormal; Notable for the following:    APPearance CLOUDY (*)    Leukocytes, UA TRACE (*)    All other components within normal limits  CULTURE, BLOOD (ROUTINE X 2)  CULTURE, BLOOD (ROUTINE X 2)  URINE MICROSCOPIC-ADD ON   Dg Chest 2 View  10/11/2012  *RADIOLOGY REPORT*  Clinical Data: Short of breath.  Fever.  Asthma. Postop from right mastectomy for breast carcinoma.  CHEST - 2 VIEW  Comparison: 03/11/2012  Findings: Surgical drains are seen in the right chest wall.  Left- sided Port-A-Cath remains in appropriate position.  No evidence of pneumothorax.  Low lung volumes are seen with mild bibasilar atelectasis.  No evidence of pulmonary consolidation or edema.  No evidence of pleural effusion.  IMPRESSION: Low lung volumes with mild bibasilar atelectasis.  No evidence of pulmonary consolidation or pleural effusion.   Original Report Authenticated By: Myles Rosenthal, M.D.      1. Postoperative fever      MDM  Patient is a 42 year old female with an extensive past medical history including lupus, cervical cancer, and breast cancer. Patient presents with fever ranging between 100-102 last night. She status  post right mastectomy x5 days. Patient notes to associated nausea, one episode of nonbloody nonbilious emesis, generalized weakness, diaphoresis, and chills. She denies shortness of breath. Patient is teary and  emotional which she states is secondary to the discomfort she's feeling in her right chest around her surgical incision site. There is no evidence of infection around the patient's surgical incision site or insertion site of both her drains. Both of her drains evaluated and intact draining serosanguineous fluid. Patient workup at this time will include basic labs, blood cultures, urinalysis, and chest x-ray for initial evaluation. IV dilaudid and fluids ordered.  Patient remains exquisitely tender around her surgical site. Leukocytosis to 11.4 and CXR with mild bibasilar atelectasis. No evidence of UTI; blood cultures pending. [facility] consulted who have agreed to come assess the patient.  Surgery will admit the patient the hospital for further workup and evaluation of the patient's postoperative fever. Patient also seen by Dr. Estell Harpin with whom this patient's ED work up and management was discussed.     Antony Madura, PA-C 10/12/12 [phone]

## 2012-10-11 NOTE — ED Notes (Signed)
Johnny Bridge, RN transported pt on monitor to 1422

## 2012-10-11 NOTE — ED Notes (Signed)
Pt had a right mastectomy on Wed., March 12th and still has two drains remaining.  Site around drains are not red or draining.  Pt wearing binder around chest.   Per pt, the pain (pain around drains, armpit and sternum) became more intense last night so Dr. Doreen Salvage RN advised pt to come into the ER for evaluation.  Pt has a port-a-cath on left chest and right arm is restricted (restriction band placed) due to recent mastectomy.  Pt complaining of feeling cold and weaker than usual.

## 2012-10-11 NOTE — ED Notes (Signed)
JP bulb at mastectomy incision on right chest contains around 100cc of serosanguineous fluid.  Will not empty so that general surgery may assess drainage (consult made).

## 2012-10-11 NOTE — Telephone Encounter (Signed)
Patient called back and states she has 102.35F fever and severe pain.  Patient advised to go to [facility] ED at this time.  Patient states understand and agreeable at this time.

## 2012-10-11 NOTE — Discharge Summary (Signed)
Physician Discharge Summary  Patient ID: Holly Parrish MRN: [id number] DOB/AGE: 10/04/1970 42 y.o.  Admit date: 10/06/2012 Discharge date: 10/11/2012  Admission Diagnoses: Clinical stage III right breast cancer s/p primary chemotherapy  Discharge Diagnoses:  Active Problems:   * No active hospital problems. *   Discharged Condition: good  Hospital Course: 50 yof who underwent primary systemic therapy for locally advanced right breast cancer with good response.  We reviewed options after therapy including post treatment mri and have decided to proceed with mastectomy as well as axillary clearance due to positive nodes up front.  She underwent right mrm without difficulty and has recovered well. She was maintained in hospital for two days due to pain control.   Consults: none  Significant Diagnostic Studies: none  Treatments: right MRM    Disposition: 01-Home or Self Care   Future Appointments Provider Department Dept Phone   10/15/2012 8:30 AM Emelia Loron, MD [facility], [place] [phone]   10/19/2012 1:15 PM [facility] MEDICAL ONCOLOGY [phone]   10/19/2012 1:45 PM Amy Wenda [facility] MEDICAL ONCOLOGY [phone]   10/19/2012 3:00 PM [facility] MEDICAL ONCOLOGY [phone]       Med List    Warning      Cannot display patient medications because the patient has not yet arrived.            Follow-up Information   Follow up with [facility], MD In 1 week.   Contact information:   [address] [place] [address] [phone]       Signed: Emelia Loron 10/11/2012, 11:39 AM

## 2012-10-11 NOTE — Telephone Encounter (Signed)
LMOM for pt to call me about messages left over the weekend with the on call doctor.

## 2012-10-11 NOTE — ED Notes (Signed)
MD at bedside. 

## 2012-10-11 NOTE — Progress Notes (Signed)
[facility] ED CM noted pt with medicare coverage but no pcp listed Spoke with pt who confirms no pcp but aware of how to obtain one with insurance coverage customer service number or web site She reports primarily being followed by oncologist Dr Darnelle Catalan

## 2012-10-11 NOTE — ED Notes (Signed)
Johnny Bridge, RN transporting

## 2012-10-11 NOTE — ED Notes (Signed)
Johnny Bridge, RN transport pt

## 2012-10-11 NOTE — ED Notes (Signed)
Patient transported to X-ray 

## 2012-10-11 NOTE — Progress Notes (Signed)
Patient ID: Holly Parrish, female   DOB: May 28, 1971, 42 y.o.   MRN: [id number] Brief Note  Pt with fevers, chills, vomiting, and severe pain.  Pts WBC is slightly elevated.  Pain is uncontrolled by home pain medicine.  Blood culutres pending.  She does have a port-a-cath.  Will admit the patient for pain control and IV antibiotics.  OK to eat if no nausea or vomiting.   Full note to follow  .Auburn Hester 10/11/2012 1:24 PM

## 2012-10-11 NOTE — ED Notes (Signed)
Pt desatting to 90% during sleep- 2L Marianne applied.  O2 saturation up to 95%

## 2012-10-11 NOTE — Progress Notes (Signed)
General Surgery [facility]  Patient seen and examined in the ER.  Patient complains of pain in right chest wall and right axilla.  Pain markedly out of proportion to physical exam findings.  Will get chest CT scan to rule out acute soft tissue process.  Pain management is a chronic issue per patient and family at bedside.  Will ask Dr. Darnelle Catalan to assist with pain management.  Dr. Dwain Sarna is on vacation.  Will manage on [facility] MD general surgery service until his return.  Velora Heckler, MD, [facility] Office: [phone]

## 2012-10-12 ENCOUNTER — Encounter (HOSPITAL_COMMUNITY): Payer: Self-pay | Admitting: Internal Medicine

## 2012-10-12 ENCOUNTER — Other Ambulatory Visit: Payer: Self-pay | Admitting: Oncology

## 2012-10-12 DIAGNOSIS — Z9010 Acquired absence of unspecified breast and nipple: Secondary | ICD-10-CM

## 2012-10-12 DIAGNOSIS — Z170 Estrogen receptor positive status [ER+]: Secondary | ICD-10-CM

## 2012-10-12 DIAGNOSIS — C50519 Malignant neoplasm of lower-outer quadrant of unspecified female breast: Secondary | ICD-10-CM

## 2012-10-12 DIAGNOSIS — G8918 Other acute postprocedural pain: Principal | ICD-10-CM

## 2012-10-12 LAB — CBC
HCT: 31.8 % — ABNORMAL LOW (ref 36.0–46.0)
MCH: 33.8 pg (ref 26.0–34.0)
MCV: 103.2 fL — ABNORMAL HIGH (ref 78.0–100.0)
RBC: 3.08 MIL/uL — ABNORMAL LOW (ref 3.87–5.11)
WBC: 7.9 10*3/uL (ref 4.0–10.5)

## 2012-10-12 LAB — MRSA PCR SCREENING: MRSA by PCR: NEGATIVE

## 2012-10-12 MED ORDER — CHLORHEXIDINE GLUCONATE CLOTH 2 % EX PADS
6.0000 | MEDICATED_PAD | Freq: Every day | CUTANEOUS | Status: DC
  Administered 2012-10-12: 6 via TOPICAL

## 2012-10-12 MED ORDER — MUPIROCIN 2 % EX OINT
1.0000 "application " | TOPICAL_OINTMENT | Freq: Two times a day (BID) | CUTANEOUS | Status: DC
  Administered 2012-10-12 – 2012-10-13 (×3): 1 via NASAL
  Filled 2012-10-12: qty 22

## 2012-10-12 MED ORDER — PANTOPRAZOLE SODIUM 40 MG PO TBEC
40.0000 mg | DELAYED_RELEASE_TABLET | Freq: Every day | ORAL | Status: DC
  Administered 2012-10-12 – 2012-10-13 (×2): 40 mg via ORAL
  Filled 2012-10-12 (×2): qty 1

## 2012-10-12 MED ORDER — AMITRIPTYLINE HCL 50 MG PO TABS
50.0000 mg | ORAL_TABLET | Freq: Every day | ORAL | Status: DC
  Administered 2012-10-12: 50 mg via ORAL
  Filled 2012-10-12 (×2): qty 1

## 2012-10-12 MED ORDER — OXYCODONE HCL 5 MG PO TABS
10.0000 mg | ORAL_TABLET | ORAL | Status: DC | PRN
  Administered 2012-10-12 – 2012-10-13 (×6): 10 mg via ORAL
  Filled 2012-10-12 (×6): qty 2

## 2012-10-12 MED ORDER — POLYETHYLENE GLYCOL 3350 17 G PO PACK
17.0000 g | PACK | Freq: Every day | ORAL | Status: DC
  Administered 2012-10-12 – 2012-10-13 (×2): 17 g via ORAL
  Filled 2012-10-12 (×2): qty 1

## 2012-10-12 MED ORDER — CIPROFLOXACIN HCL 500 MG PO TABS
500.0000 mg | ORAL_TABLET | Freq: Two times a day (BID) | ORAL | Status: DC
  Administered 2012-10-12 – 2012-10-13 (×2): 500 mg via ORAL
  Filled 2012-10-12 (×4): qty 1

## 2012-10-12 NOTE — Progress Notes (Addendum)
Subjective: Feels somewhat better on neurontin, oxycodone, she says feels like she had flu, no specific symptoms  Objective: Vital signs in last 24 hours: Temp:  [98.1 F (36.7 C)-99.8 F (37.7 C)] 98.1 F (36.7 C) (03/18 0456) Pulse Rate:  [73-103] 80 (03/18 0456) Resp:  [14-24] 16 (03/18 0456) BP: (92-114)/(52-70) 100/59 mmHg (03/18 0456) SpO2:  [93 %-97 %] 95 % (03/18 0456) Weight:  [205 lb 9.6 oz (93.26 kg)] 205 lb 9.6 oz (93.26 kg) (03/17 1721) Last BM Date: 10/10/12  Intake/Output from previous day: 03/17 0701 - 03/18 0700 In: 2397.5 [I.V.:1997.5; IV Piggyback:400] Out: 1535 [Urine:1400; Drains:60] Intake/Output this shift:    General appearance: no distress Incision/Wound:clean without infection, drains with minimal output, she has large fold under arm  Lab Results:   Recent Labs  10/11/12 1130  WBC 11.4*  HGB 11.6*  HCT 34.2*  PLT 611*   BMET  Recent Labs  10/11/12 1130  NA 137  K 3.7  CL 103  CO2 26  GLUCOSE 95  BUN 14  CREATININE 0.90  CALCIUM 8.9   Studies/Results: Dg Chest 2 View  10/11/2012  *RADIOLOGY REPORT*  Clinical Data: Short of breath.  Fever.  Asthma. Postop from right mastectomy for breast carcinoma.  CHEST - 2 VIEW  Comparison: 03/11/2012  Findings: Surgical drains are seen in the right chest wall.  Left- sided Port-A-Cath remains in appropriate position.  No evidence of pneumothorax.  Low lung volumes are seen with mild bibasilar atelectasis.  No evidence of pulmonary consolidation or edema.  No evidence of pleural effusion.  IMPRESSION: Low lung volumes with mild bibasilar atelectasis.  No evidence of pulmonary consolidation or pleural effusion.   Original Report Authenticated By: Myles Rosenthal, M.D.    Ct Chest W Contrast  10/11/2012  *RADIOLOGY REPORT*  Clinical Data:   5 days post right mastectomy for breast cancer, presenting now with severe right chest wall pain.  CT CHEST WITH CONTRAST  Technique:  Multidetector CT imaging of the  chest was performed following the standard protocol during bolus administration of intravenous contrast.  Contrast: 80mL OMNIPAQUE IOHEXOL 300 MG/ML  SOLN  Comparison: PET CT 03/12/2012.  Findings: Surgical drain in place in the right mastectomy bed.  No post-operative fluid collection.  Expected post-surgical changes in the right axilla after node dissection.  No unexpected post- surgical findings in the right chest wall.  Left subclavian Port-A-Cath with the catheter tip at the cavoatrial junction.  Heart size normal.  No visible coronary atherosclerosis. No visible atherosclerosis involving the thoracic or upper abdominal aorta.  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 Retrosternal lymphadenopathy is felt less likely.  No significant lymphadenopathy in the hila, mediastinum, or axillae.  Thyroid gland normal in appearance.  Peripheral nodules in the posterior right lower lobe, each on the order of approximately 5 mm (series 7, images 28 and 29), unchanged from the prior PET CT.  No new or enlarging pulmonary parenchymal nodules in either lung.  Expected dependent atelectasis in the posterior lower lobes bilaterally.  Lungs otherwise clear.  No pleural effusions.  Prior cholecystectomy.  Visualized upper abdomen unremarkable. Bone window images demonstrate minimal thoracic spondylosis but no evidence of osseous metastatic disease.  IMPRESSION:  1.  Expected post-surgical changes in the right chest wall and right axilla post mastectomy and axillary node dissection.  No abnormal fluid collection. 2.  Adjacent 5 mm  peripheral nodules in the right lower lobe, unchanged since the PET CT from August, 2013.  These are statistically benign, but attention on follow-up is recommended. 3.  Expected dependent atelectasis in the lower lobes bilaterally. No acute cardiopulmonary disease otherwise.   Original  Report Authenticated By: Hulan Saas, M.D.     Assessment/Plan: Pain and postop fever at home  She has not had fever here.  Was placed on cipro by admitting team for ? Source.  Her wound is not source, will continue drains, she has flap under arm that has come up after what was difficult alnd that may have to be taken care of later has not been wearing compression at all.  Will continue neurontin and oxycodone at current level and hopefully will discharge in am.  She has chronic pain issues from fibromyalgia that have been much better on neurontin and oxycodone in past.    [facility] 10/12/2012

## 2012-10-12 NOTE — Progress Notes (Signed)
   CARE MANAGEMENT NOTE 10/12/2012  Patient:  Holly Parrish, Holly Parrish   Account Number:  [id number]  Date Initiated:  10/12/2012  Documentation initiated by:  Jiles Crocker  Subjective/Objective Assessment:   ADMITTED WITH POST OP PAIN AND FEVER     Action/Plan:   RECENT DISCHARGE; CM FOLLOWING FOR DCP   Anticipated DC Date:  10/14/2012   Anticipated DC Plan:  HOME/SELF CARE      DC Planning Services  CM consult              Status of service:  In process, will continue to follow Medicare Important Message given?  NA - LOS <3 / Initial given by admissions (If response is "NO", the following Medicare IM given date fields will be blank) Per UR Regulation:  Reviewed for med. necessity/level of care/duration of stay Comments:  10/12/2012- B Larue Drawdy RN,BSN,MHA

## 2012-10-12 NOTE — Progress Notes (Signed)
This patient is receiving IV Protonix. Based on criteria approved by the Pharmacy and Therapeutics Committee, this medication is being converted to the equivalent oral dose form. These criteria include:   . The patient is eating (either orally or per tube) and/or has been taking other orally administered medications  . This patient has no evidence of active gastrointestinal bleeding or impaired GI absorption (gastrectomy, short bowel, patient on TNA or NPO).   If you have questions about this conversion, please contact the pharmacy department.  Dannielle Huh, [facility] 10/12/2012 12:11 PM

## 2012-10-12 NOTE — H&P (Signed)
General Surgery [facility]  Patient seen and evaluated in ED.  See earlier note.  Velora Heckler, MD, [facility] Office: [phone]

## 2012-10-12 NOTE — Progress Notes (Signed)
Holly Parrish   DOB:05-29-1971   YN#:[id number]   [phone]  Subjective: Holly Parrish complains of uncontrolled pain, though she tells me it is better today; she wants to get off the IV morphine so she can go home; she hates the peripheral neuropathy, which primarily affects her feet; this is nearly disabling to her, but she "has to work;" no BM since admission; very tearful; son and daughter in room   Objective: middle aged White woman examined in bed Filed Vitals:   10/12/12 1246  BP: 93/48  Pulse: 80  Temp: 98.3 F (36.8 C)  Resp: 16    Body mass index is 37.6 kg/(m^2).  Intake/Output Summary (Last 24 hours) at 10/12/12 1328 Last data filed at 10/12/12 0839  Gross per 24 hour  Intake 1637.5 ml  Output   1535 ml  Net  102.5 ml    Right breast s/p mastectomy, incision looks fine, steri strips in place, drain in place  CBG (last 3)  No results found for this basename: GLUCAP,  in the last 72 hours   Labs:  Lab Results  Component Value Date   WBC 7.9 10/12/2012   HGB 10.4* 10/12/2012   HCT 31.8* 10/12/2012   MCV 103.2* 10/12/2012   PLT 542* 10/12/2012   NEUTROABS 7.0 10/11/2012    @LASTCHEMISTRY @  Urine Studies No results found for this basename: UACOL, UAPR, USPG, UPH, UTP, UGL, UKET, UBIL, UHGB, UNIT, UROB, ULEU, UEPI, UWBC, URBC, UBAC, CAST, CRYS, UCOM, BILUA,  in the last 72 hours  Basic Metabolic Panel:  Recent Labs Lab 10/07/12 0401 10/11/12 1130  NA 137 137  K 4.3 3.7  CL 104 103  CO2 25 26  GLUCOSE 127* 95  BUN 17 14  CREATININE 0.75 0.90  CALCIUM 8.6 8.9   GFR Estimated Creatinine Clearance: 85.8 ml/min (by C-G formula based on Cr of 0.9). Liver Function Tests: No results found for this basename: AST, ALT, ALKPHOS, BILITOT, PROT, ALBUMIN,  in the last 168 hours No results found for this basename: LIPASE, AMYLASE,  in the last 168 hours No results found for this basename: AMMONIA,  in the last 168 hours Coagulation profile No results found for this  basename: INR, PROTIME,  in the last 168 hours  CBC:  Recent Labs Lab 10/07/12 0401 10/11/12 1130 10/12/12 1025  WBC 10.2 11.4* 7.9  NEUTROABS  --  7.0  --   HGB 11.3* 11.6* 10.4*  HCT 33.3* 34.2* 31.8*  MCV 101.2* 100.9* 103.2*  PLT 429* 611* 542*   Cardiac Enzymes: No results found for this basename: CKTOTAL, CKMB, CKMBINDEX, TROPONINI,  in the last 168 hours BNP: No components found with this basename: POCBNP,  CBG: No results found for this basename: GLUCAP,  in the last 168 hours D-Dimer No results found for this basename: DDIMER,  in the last 72 hours Hgb A1c No results found for this basename: HGBA1C,  in the last 72 hours Lipid Profile No results found for this basename: CHOL, HDL, LDLCALC, TRIG, CHOLHDL, LDLDIRECT,  in the last 72 hours Thyroid function studies No results found for this basename: TSH, T4TOTAL, FREET3, T3FREE, THYROIDAB,  in the last 72 hours Anemia work up No results found for this basename: VITAMINB12, FOLATE, FERRITIN, TIBC, IRON, RETICCTPCT,  in the last 72 hours Microbiology Recent Results (from the past 240 hour(s))  CULTURE, BLOOD (ROUTINE X 2)     Status: None   Collection Time    10/11/12 10:30 AM      Result  Value Range Status   Specimen Description BLOOD LEFT HAND   Final   Special Requests BOTTLES DRAWN AEROBIC AND ANAEROBIC 3CC   Final   Culture  Setup Time 10/11/2012 14:19   Final   Culture     Final   Value:        BLOOD CULTURE RECEIVED NO GROWTH TO DATE CULTURE WILL BE HELD FOR 5 DAYS BEFORE ISSUING A FINAL NEGATIVE REPORT   Report Status PENDING   Incomplete  CULTURE, BLOOD (ROUTINE X 2)     Status: None   Collection Time    10/11/12 12:20 PM      Result Value Range Status   Specimen Description BLOOD LEFT HAND   Final   Special Requests BOTTLES DRAWN AEROBIC AND ANAEROBIC 5CC   Final   Culture  Setup Time 10/11/2012 14:19   Final   Culture     Final   Value:        BLOOD CULTURE RECEIVED NO GROWTH TO DATE CULTURE WILL BE  HELD FOR 5 DAYS BEFORE ISSUING A FINAL NEGATIVE REPORT   Report Status PENDING   Incomplete  MRSA PCR SCREENING     Status: None   Collection Time    10/12/12  1:19 AM      Result Value Range Status   MRSA by PCR NEGATIVE  NEGATIVE Final   Comment:            The GeneXpert MRSA Assay (FDA     approved for NASAL specimens     only), is one component of a     comprehensive MRSA colonization     surveillance program. It is not     intended to diagnose MRSA     infection nor to guide or     monitor treatment for     MRSA infections.      Studies:  Dg Chest 2 View  10/11/2012  *RADIOLOGY REPORT*  Clinical Data: Short of breath.  Fever.  Asthma. Postop from right mastectomy for breast carcinoma.  CHEST - 2 VIEW  Comparison: 03/11/2012  Findings: Surgical drains are seen in the right chest wall.  Left- sided Port-A-Cath remains in appropriate position.  No evidence of pneumothorax.  Low lung volumes are seen with mild bibasilar atelectasis.  No evidence of pulmonary consolidation or edema.  No evidence of pleural effusion.  IMPRESSION: Low lung volumes with mild bibasilar atelectasis.  No evidence of pulmonary consolidation or pleural effusion.   Original Report Authenticated By: Myles Rosenthal, M.D.    Ct Chest W Contrast  10/11/2012  *RADIOLOGY REPORT*  Clinical Data:   5 days post right mastectomy for breast cancer, presenting now with severe right chest wall pain.  CT CHEST WITH CONTRAST  Technique:  Multidetector CT imaging of the chest was performed following the standard protocol during bolus administration of intravenous contrast.  Contrast: 80mL OMNIPAQUE IOHEXOL 300 MG/ML  SOLN  Comparison: PET CT 03/12/2012.  Findings: Surgical drain in place in the right mastectomy bed.  No post-operative fluid collection.  Expected post-surgical changes in the right axilla after node dissection.  No unexpected post- surgical findings in the right chest wall.  Left subclavian Port-A-Cath with the catheter  tip at the cavoatrial junction.  Heart size normal.  No visible coronary atherosclerosis. No visible atherosclerosis involving the thoracic or upper abdominal aorta.  Thymic hyperplasia in the anterior-superior mediastinum, as there is thymic tissue which was not present on the PET CT, and is likely secondary  to chemotherapy. Retrosternal lymphadenopathy is felt less likely.  No significant lymphadenopathy in the hila, mediastinum, or axillae.  Thyroid gland normal in appearance.  Peripheral nodules in the posterior right lower lobe, each on the order of approximately 5 mm (series 7, images 28 and 29), unchanged from the prior PET CT.  No new or enlarging pulmonary parenchymal nodules in either lung.  Expected dependent atelectasis in the posterior lower lobes bilaterally.  Lungs otherwise clear.  No pleural effusions.  Prior cholecystectomy.  Visualized upper abdomen unremarkable. Bone window images demonstrate minimal thoracic spondylosis but no evidence of osseous metastatic disease.  IMPRESSION:  1.  Expected post-surgical changes in the right chest wall and right axilla post mastectomy and axillary node dissection.  No abnormal fluid collection. 2.  Adjacent 5 mm peripheral nodules in the right lower lobe, unchanged since the PET CT from August, 2013.  These are statistically benign, but attention on follow-up is recommended. 3.  Expected dependent atelectasis in the lower lobes bilaterally. No acute cardiopulmonary disease otherwise.   Original Report Authenticated By: Hulan Saas, M.D.     Assessment: 42 y.o. 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 Palpable right axillary adenopathy at presentation.  (2) Being treated in the neoadjuvant setting. Status post:  (a) 3 cycles of Taxotere/carbo with Herceptin, with poor tolerance  (b)  One cycle of weekly Carbo/Taxol and Herceptin in early November 2013, with poor tolerance  (c) 2 doses of weekly Carbo/Gemzar with Herceptin with poor tolerance and worsening neuropathy. Last dose of chemo given in early December 2013.  (3) Herceptin to be continued for a total of one year; most recent echo 08/17/2012 (4) s/p Right modified radical mastectomy 10/06/2012 which shows residual invasive ductal carcinoma, ypT1c, grade 2, but 22 lymph nodes negative ypN0 (5) margin is close and she will need post-mastectomy irradiation (Dr Dayton Scrape)  Plan: I discussed pain management issues with Averi and her children. I am adding elavil 50 mg at bedtim for synergy with her pain medications and liberalizing the oral oxycodone; she will use the IV morphine only as a last resort; if she can avoid it for the next 24 hours and is moderately comfortable she may be able to go home tomorrow.  The post-op pain will improve quickly, especially once drains are pulled. thje neuropathy pain in the lower extremities and feet may prove to be chronic. If she is not on a stable dose of medication with good control after 6 weeks or so we will refer to a pain clinic. She already has an appointment with [place] 03/25.  Please let me know if I can be of further help.    Jocelyne Reinertsen C 10/12/2012

## 2012-10-12 NOTE — ED Provider Notes (Signed)
Medical screening examination/treatment/procedure(s) were conducted as a shared visit with non-physician practitioner(s) and myself.  I personally evaluated the patient during the encounter   Benny Lennert, MD 10/12/12 1537

## 2012-10-12 NOTE — H&P (Signed)
Holly Parrish is an 42 y.o. female.   Chief Complaint: Post operative fever and pain HPI: 42 year old female with a past medical history of lupus, myopathy, breast cancer, and cervical cancer, status post right mastectomy 10/06/12 who presents to [facility] with complaints of a fever that began last night. Patient states her temperature ranged from 100-102F.  Patient states she has been diaphoretic and had the chills last night. She also had nausea with 1 episode of emesis, generalized weakness and right-sided chest wall pain which she attributes to her surgery. Patient has two JP drains with sero/sang fluid. Patient also has a left Port-A-Cath.  Surgery was done by Dr. Dwain Sarna  Past Medical History  Diagnosis Date  . Lupus   . Joint pain   . Hearing loss   . Bilateral swelling of feet   . Breast cancer 40  . Cancer 37    Cervical, treated with cryoablation  . Asthma   . Neuromuscular disorder   . Migraines   . Headache   . Arthritis     Past Surgical History  Procedure Laterality Date  . Tonsillectomy    . Cholecystectomy    . 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 Portacath placement  03/11/2012    Procedure: INSERTION PORT-A-CATH;  Surgeon: Emelia Loron, MD;  Location: [facility];  Service: General;  Laterality: Left;  . Hand surgery  1997  . Appendectomy  1995  . Abdominal exploration surgery  [id number]  . Tubal ligation  1997  . Mastectomy modified radical Right 10/06/2012    Procedure: MASTECTOMY MODIFIED RADICAL WITH AXILLARY CONTENT;  Surgeon: Emelia Loron, MD;  Location: [facility];  Service: General;  Laterality: Right;    Family History  Problem Relation Age of Onset  . Uterine cancer Mother 82  . Pancreatic cancer Paternal Uncle     diagnosed in his 7s; smoker; half uncle related through grandmother  . Cancer Paternal Uncle     Throat  . Liver cancer Maternal Grandmother     not a drinker  . Cancer Maternal Grandmother   . Breast cancer Cousin      3 paternal cousins with breast cancer, onset 62, 50 and one bilateral at  102 and 52  . Cancer Cousin     Breast  . Heart attack Father 41  . Breast cancer Paternal Grandmother 63  . Heart attack Paternal Grandfather   . Throat cancer Paternal Aunt     smoker; half uncle related through grandmother  . Breast cancer Paternal Aunt     2 paternal aunts, both with 2 diagnoses of breast cancer each  . Cystic fibrosis Cousin   . Cancer Cousin     Breast  . Cancer Cousin     Breast  . Breast cancer Paternal Aunt    Social History:  reports that she has never smoked. She has never used smokeless tobacco. She reports that  drinks alcohol. She reports that she does not use illicit drugs.  Allergies:  Allergies  Allergen Reactions  . Bee Venom Anaphylaxis  . Nsaids Anaphylaxis  . Penicillins Anaphylaxis  . Tetanus Toxoids Anaphylaxis  . Vitamin K And Related Anaphylaxis  . Hydrogen Peroxide Other (See Comments)    "It burns until I bleed."  . Banana Other (See Comments)    Face swells    Medications Prior to Admission  Medication Sig Dispense Refill  . B Complex Vitamins (VITAMIN B COMPLEX PO) Take 1 tablet by mouth daily.       Marland Kitchen  docusate sodium (COLACE) 100 MG capsule Take 200 mg by mouth every morning.      Marland Kitchen EPINEPHrine (EPIPEN 2-PAK) 0.3 mg/0.3 mL DEVI Inject 0.3 mg into the muscle once. Bee venom      . gabapentin (NEURONTIN) 300 MG capsule Take 3 capsules (900 mg total) by mouth 4 (four) times daily.  360 capsule  1  . lidocaine-prilocaine (EMLA) cream Apply topically as needed.  30 g  0  . ondansetron (ZOFRAN) 8 MG tablet Take 1 tablet (8 mg total) by mouth every 12 (twelve) hours as needed for nausea.  20 tablet  0  . oxyCODONE-acetaminophen (ROXICET) 5-325 MG per tablet Take 1 tablet by mouth every 4 (four) hours as needed for pain.  30 tablet  0  . PRESCRIPTION MEDICATION Inject into the vein every 30 (thirty) days. trastuzumab (HERCEPTIN) 546 mg in sodium chloride 0.9 %  250 mL chemo infusion   6 mg/kg, 552 mL/hr      . promethazine (PHENERGAN) 25 MG tablet Take 1 tablet by mouth every 6 to 8 hrs as needed for nausea or vomiting  30 tablet  0    Results for orders placed during the hospital encounter of 10/11/12 (from the past 48 hour(s))  CULTURE, BLOOD (ROUTINE X 2)     Status: None   Collection Time    10/11/12 10:30 AM      Result Value Range   Specimen Description BLOOD LEFT HAND     Special Requests BOTTLES DRAWN AEROBIC AND ANAEROBIC 3CC     Culture  Setup Time 10/11/2012 14:19     Culture       Value:        BLOOD CULTURE RECEIVED NO GROWTH TO DATE CULTURE WILL BE HELD FOR 5 DAYS BEFORE ISSUING A FINAL NEGATIVE REPORT   Report Status PENDING    URINALYSIS, ROUTINE W REFLEX MICROSCOPIC     Status: Abnormal   Collection Time    10/11/12 11:24 AM      Result Value Range   Color, Urine YELLOW  YELLOW   APPearance CLOUDY (*) CLEAR   Specific Gravity, Urine 1.014  1.005 - 1.030   pH 7.5  5.0 - 8.0   Glucose, UA NEGATIVE  NEGATIVE mg/dL   Hgb urine dipstick NEGATIVE  NEGATIVE   Bilirubin Urine NEGATIVE  NEGATIVE   Ketones, ur NEGATIVE  NEGATIVE mg/dL   Protein, ur NEGATIVE  NEGATIVE mg/dL   Urobilinogen, UA 1.0  0.0 - 1.0 mg/dL   Nitrite NEGATIVE  NEGATIVE   Leukocytes, UA TRACE (*) NEGATIVE  URINE MICROSCOPIC-ADD ON     Status: None   Collection Time    10/11/12 11:24 AM      Result Value Range   Squamous Epithelial / LPF RARE  RARE   Comment: RARE   WBC, UA 0-2  <3 WBC/hpf   Comment: 0-2   RBC / HPF 0-2  <3 RBC/hpf   Comment: 0-2   Bacteria, UA RARE  RARE   Comment: RARE   Urine-Other AMORPHOUS URATES/PHOSPHATES     Comment: AMORPHOUS URATES/PHOSPHATES  CBC WITH DIFFERENTIAL     Status: Abnormal   Collection Time    10/11/12 11:30 AM      Result Value Range   WBC 11.4 (*) 4.0 - 10.5 K/uL   RBC 3.39 (*) 3.87 - 5.11 MIL/uL   Hemoglobin 11.6 (*) 12.0 - 15.0 g/dL   HCT 29.5 (*) 28.4 - 13.2 %   MCV 100.9 (*) 78.0 -  100.0 fL   MCH  34.2 (*) 26.0 - 34.0 pg   MCHC 33.9  30.0 - 36.0 g/dL   RDW 40.9  81.1 - 91.4 %   Platelets 611 (*) 150 - 400 K/uL   Neutrophils Relative 62  43 - 77 %   Neutro Abs 7.0  1.7 - 7.7 K/uL   Lymphocytes Relative 27  12 - 46 %   Lymphs Abs 3.1  0.7 - 4.0 K/uL   Monocytes Relative 10  3 - 12 %   Monocytes Absolute 1.1 (*) 0.1 - 1.0 K/uL   Eosinophils Relative 1  0 - 5 %   Eosinophils Absolute 0.1  0.0 - 0.7 K/uL   Basophils Relative 0  0 - 1 %   Basophils Absolute 0.0  0.0 - 0.1 K/uL  BASIC METABOLIC PANEL     Status: Abnormal   Collection Time    10/11/12 11:30 AM      Result Value Range   Sodium 137  135 - 145 mEq/L   Potassium 3.7  3.5 - 5.1 mEq/L   Chloride 103  96 - 112 mEq/L   CO2 26  19 - 32 mEq/L   Glucose, Bld 95  70 - 99 mg/dL   BUN 14  6 - 23 mg/dL   Creatinine, Ser 7.82  0.50 - 1.10 mg/dL   Calcium 8.9  8.4 - 95.6 mg/dL   GFR calc non Af Amer 77 (*) >90 mL/min   GFR calc Af Amer 89 (*) >90 mL/min   Comment:            The eGFR has been calculated     using the CKD EPI equation.     This calculation has not been     validated in all clinical     situations.     eGFR's persistently     <90 mL/min signify     possible Chronic Kidney Disease.  CULTURE, BLOOD (ROUTINE X 2)     Status: None   Collection Time    10/11/12 12:20 PM      Result Value Range   Specimen Description BLOOD LEFT HAND     Special Requests BOTTLES DRAWN AEROBIC AND ANAEROBIC 5CC     Culture  Setup Time 10/11/2012 14:19     Culture       Value:        BLOOD CULTURE RECEIVED NO GROWTH TO DATE CULTURE WILL BE HELD FOR 5 DAYS BEFORE ISSUING A FINAL NEGATIVE REPORT   Report Status PENDING    MRSA PCR SCREENING     Status: None   Collection Time    10/12/12  1:19 AM      Result Value Range   MRSA by PCR NEGATIVE  NEGATIVE   Comment:            The GeneXpert MRSA Assay (FDA     approved for NASAL specimens     only), is one component of a     comprehensive MRSA colonization     surveillance  program. It is not     intended to diagnose MRSA     infection nor to guide or     monitor treatment for     MRSA infections.   Dg Chest 2 View  10/11/2012  *RADIOLOGY REPORT*  Clinical Data: Short of breath.  Fever.  Asthma. Postop from right mastectomy for breast carcinoma.  CHEST - 2 VIEW  Comparison: 03/11/2012  Findings: Surgical drains are  seen in the right chest wall.  Left- sided Port-A-Cath remains in appropriate position.  No evidence of pneumothorax.  Low lung volumes are seen with mild bibasilar atelectasis.  No evidence of pulmonary consolidation or edema.  No evidence of pleural effusion.  IMPRESSION: Low lung volumes with mild bibasilar atelectasis.  No evidence of pulmonary consolidation or pleural effusion.   Original Report Authenticated By: Myles Rosenthal, M.D.    Ct Chest W Contrast  10/11/2012  *RADIOLOGY REPORT*  Clinical Data:   5 days post right mastectomy for breast cancer, presenting now with severe right chest wall pain.  CT CHEST WITH CONTRAST  Technique:  Multidetector CT imaging of the chest was performed following the standard protocol during bolus administration of intravenous contrast.  Contrast: 80mL OMNIPAQUE IOHEXOL 300 MG/ML  SOLN  Comparison: PET CT 03/12/2012.  Findings: Surgical drain in place in the right mastectomy bed.  No post-operative fluid collection.  Expected post-surgical changes in the right axilla after node dissection.  No unexpected post- surgical findings in the right chest wall.  Left subclavian Port-A-Cath with the catheter tip at the cavoatrial junction.  Heart size normal.  No visible coronary atherosclerosis. No visible atherosclerosis involving the thoracic or upper abdominal aorta.  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 Retrosternal lymphadenopathy is felt less likely.  No significant lymphadenopathy in the hila, mediastinum, or  axillae.  Thyroid gland normal in appearance.  Peripheral nodules in the posterior right lower lobe, each on the order of approximately 5 mm (series 7, images 28 and 29), unchanged from the prior PET CT.  No new or enlarging pulmonary parenchymal nodules in either lung.  Expected dependent atelectasis in the posterior lower lobes bilaterally.  Lungs otherwise clear.  No pleural effusions.  Prior cholecystectomy.  Visualized upper abdomen unremarkable. Bone window images demonstrate minimal thoracic spondylosis but no evidence of osseous metastatic disease.  IMPRESSION:  1.  Expected post-surgical changes in the right chest wall and right axilla post mastectomy and axillary node dissection.  No abnormal fluid collection. 2.  Adjacent 5 mm peripheral nodules in the right lower lobe, unchanged since the PET CT from August, 2013.  These are statistically benign, but attention on follow-up is recommended. 3.  Expected dependent atelectasis in the lower lobes bilaterally. No acute cardiopulmonary disease otherwise.   Original Report Authenticated By: Hulan Saas, M.D.     Review of Systems  Constitutional: Positive for fever, chills and diaphoresis.  HENT: Negative.   Eyes: Negative.   Respiratory: Negative.   Cardiovascular: Positive for chest pain (around surgical sites).  Gastrointestinal: Negative for nausea and vomiting.  Genitourinary: Negative.   Musculoskeletal: Negative.   Skin: Negative.   Neurological: Positive for weakness. Negative for tingling and tremors.  Endo/Heme/Allergies: Negative.   Psychiatric/Behavioral: Negative.     Blood pressure 100/59, pulse 80, temperature 98.1 F (36.7 C), temperature source Oral, resp. rate 16, height 5\' 2"  (1.575 m), weight 205 lb 9.6 oz (93.26 kg), last menstrual period 03/02/2012, SpO2 95.00%. Physical Exam  Constitutional: She is oriented to person, place, and time. She appears well-developed and well-nourished.  Appears in pain    HENT:   Head: Normocephalic and atraumatic.  Eyes: Conjunctivae are normal. Pupils are equal, round, and reactive to light.  Neck: Normal range of motion. Neck supple.  Cardiovascular: Normal rate and regular rhythm.   Respiratory: Effort normal and breath sounds  normal. She exhibits tenderness (around surgical site).  Incisions appear healthy, no erythema, drains have sero/sang output, no erythema around drains  GI: Soft. Bowel sounds are normal.  Genitourinary:  Deferred   Musculoskeletal: Normal range of motion.  Neurological: She is alert and oriented to person, place, and time.  Skin: Skin is warm and dry.  Psychiatric: Her behavior is normal.  Very tearful     Assessment/Plan 1. Post-operative pain/fever: unsure of source of fever, could be atelectasis, pain out of proportion, spoke with MD and will admit for pain control and further work up.  Will re-evaluate in AM.  Shareen Capwell 10/12/2012, 9:37 AM

## 2012-10-13 MED ORDER — AMITRIPTYLINE HCL 50 MG PO TABS: 50.0000 mg | ORAL_TABLET | Freq: Every day | ORAL | Status: DC

## 2012-10-13 MED ORDER — HEPARIN SOD (PORK) LOCK FLUSH 100 UNIT/ML IV SOLN
500.0000 [IU] | INTRAVENOUS | Status: AC | PRN
  Administered 2012-10-13: 500 [IU]

## 2012-10-13 MED ORDER — OXYCODONE HCL 10 MG PO TABS: 10.0000 mg | ORAL_TABLET | ORAL | Status: DC | PRN

## 2012-10-13 MED ORDER — GABAPENTIN 300 MG PO CAPS: 900.0000 mg | ORAL_CAPSULE | Freq: Four times a day (QID) | ORAL | Status: DC

## 2012-10-13 NOTE — Progress Notes (Signed)
Patient educated about JP drain care and emptying. Patient very confident about performing care. Will continue to monitor patient. Setzer, Don Broach

## 2012-10-13 NOTE — Progress Notes (Signed)
Subjective: Pain is much better today, she would like to go home  Objective: Vital signs in last 24 hours: Temp:  [98.3 F (36.8 C)-98.6 F (37 C)] 98.5 F (36.9 C) (03/19 0500) Pulse Rate:  [52-88] 88 (03/19 0500) Resp:  [16] 16 (03/19 0500) BP: (93-106)/(48-58) 104/57 mmHg (03/19 0500) SpO2:  [94 %-97 %] 94 % (03/19 0500) Last BM Date: 10/11/12  Intake/Output from previous day: 03/18 0701 - 03/19 0700 In: 1821.3 [P.O.:600; I.V.:1221.3] Out: 2160 [Urine:2050; Drains:110] Intake/Output this shift: Total I/O In: -  Out: 300 [Urine:300]  General no distress Wound clean without infection, there is large flap laterally that is from binder present, drains with serous fluid  Lab Results:   Recent Labs  10/11/12 1130 10/12/12 1025  WBC 11.4* 7.9  HGB 11.6* 10.4*  HCT 34.2* 31.8*  PLT 611* 542*   BMET  Recent Labs  10/11/12 1130  NA 137  K 3.7  CL 103  CO2 26  GLUCOSE 95  BUN 14  CREATININE 0.90  CALCIUM 8.9   PT/INR No results found for this basename: LABPROT, INR,  in the last 72 hours ABG No results found for this basename: PHART, PCO2, PO2, HCO3,  in the last 72 hours  Studies/Results: Ct Chest W Contrast  10/11/2012  *RADIOLOGY REPORT*  Clinical Data:   5 days post right mastectomy for breast cancer, presenting now with severe right chest wall pain.  CT CHEST WITH CONTRAST  Technique:  Multidetector CT imaging of the chest was performed following the standard protocol during bolus administration of intravenous contrast.  Contrast: 80mL OMNIPAQUE IOHEXOL 300 MG/ML  SOLN  Comparison: PET CT 03/12/2012.  Findings: Surgical drain in place in the right mastectomy bed.  No post-operative fluid collection.  Expected post-surgical changes in the right axilla after node dissection.  No unexpected post- surgical findings in the right chest wall.  Left subclavian Port-A-Cath with the catheter tip at the cavoatrial junction.  Heart size normal.  No visible coronary  atherosclerosis. No visible atherosclerosis involving the thoracic or upper abdominal aorta.  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 Retrosternal lymphadenopathy is felt less likely.  No significant lymphadenopathy in the hila, mediastinum, or axillae.  Thyroid gland normal in appearance.  Peripheral nodules in the posterior right lower lobe, each on the order of approximately 5 mm (series 7, images 28 and 29), unchanged from the prior PET CT.  No new or enlarging pulmonary parenchymal nodules in either lung.  Expected dependent atelectasis in the posterior lower lobes bilaterally.  Lungs otherwise clear.  No pleural effusions.  Prior cholecystectomy.  Visualized upper abdomen unremarkable. Bone window images demonstrate minimal thoracic spondylosis but no evidence of osseous metastatic disease.  IMPRESSION:  1.  Expected post-surgical changes in the right chest wall and right axilla post mastectomy and axillary node dissection.  No abnormal fluid collection. 2.  Adjacent 5 mm peripheral nodules in the right lower lobe, unchanged since the PET CT from August, 2013.  These are statistically benign, but attention on follow-up is recommended. 3.  Expected dependent atelectasis in the lower lobes bilaterally. No acute cardiopulmonary disease otherwise.   Original Report Authenticated By: Hulan Saas, M.D.     Anti-infectives: Anti-infectives   Start     Dose/Rate Route Frequency Ordered Stop   10/12/12 2000  ciprofloxacin (CIPRO) tablet 500 mg     500 mg Oral  2 times daily 10/12/12 0751     10/11/12 1830  ciprofloxacin (CIPRO) IVPB 400 mg  Status:  Discontinued     400 mg 200 mL/hr over 60 Minutes Intravenous Every 12 hours 10/11/12 1749 10/12/12 0751      Assessment/Plan: S/p right mrm  Dc home today  [facility] 10/13/2012

## 2012-10-14 ENCOUNTER — Telehealth (INDEPENDENT_AMBULATORY_CARE_PROVIDER_SITE_OTHER): Payer: Self-pay

## 2012-10-14 NOTE — Telephone Encounter (Signed)
LMOM for pt to call me b/c I need to know if she is planning to keep her appt on 3/21 with Dr Dwain Sarna b/c if so she needs to come a little later like 9:00. Dr Dwain Sarna will be delayed coming tomorrow to the office. I know the pt just got discharged from the hospital so she might not even be planning on keeping the appt for 3/21 so if not we can get her r/s too.

## 2012-10-15 ENCOUNTER — Encounter (INDEPENDENT_AMBULATORY_CARE_PROVIDER_SITE_OTHER): Payer: Medicare Other | Admitting: General Surgery

## 2012-10-15 NOTE — Telephone Encounter (Signed)
Called pt to let her know that Dr Dwain Sarna wants to see pt on Monday 3/24 so I want pt to arrive at 3:30. The pt understands.

## 2012-10-17 LAB — CULTURE, BLOOD (ROUTINE X 2): Culture: NO GROWTH

## 2012-10-18 ENCOUNTER — Ambulatory Visit (INDEPENDENT_AMBULATORY_CARE_PROVIDER_SITE_OTHER): Payer: Medicare Other | Admitting: General Surgery

## 2012-10-18 ENCOUNTER — Encounter (INDEPENDENT_AMBULATORY_CARE_PROVIDER_SITE_OTHER): Payer: Self-pay | Admitting: General Surgery

## 2012-10-18 VITALS — BP 100/68 | HR 78 | Resp 18 | Ht 62.0 in | Wt 209.0 lb

## 2012-10-18 DIAGNOSIS — Z09 Encounter for follow-up examination after completed treatment for conditions other than malignant neoplasm: Secondary | ICD-10-CM

## 2012-10-18 NOTE — Patient Instructions (Signed)
Call Alisha Thursday at [phone] and let her know what drain output has been

## 2012-10-18 NOTE — Progress Notes (Signed)
Subjective:     Patient ID: Holly Parrish, female   DOB: 1971/02/17, 42 y.o.   MRN: [id number]  HPI This is a 42 year old female who underwent a right modified radical mastectomy after primary systemic chemotherapy. She has had her pathology discussed with her are ready. She was readmitted to the hospital basically for pain control. This is doing better now. She is an occasional twitching as well as some burning sensations in both of her arms and her feet. This is better now since she's had her medications changed. She returns today doing better. Her arm is able to move better. Her drains are appropriate. She doesn't have the amounts listed but she states that the breast drain is putting out less than 30 a day for several days it appears that this is so.  Review of Systems     Objective:   Physical Exam Healing incision with left axillary swelling That is improved Both drains have serous fluid    Assessment:     Status post right modified radical mastectomy     Plan:     I think the swelling should continue to go down. I removed the breast drain. I will leave the axillary drain. She is going to call Thursday let me know how much is coming out of this 1 hour likely have her return Friday to remove the last remaining drain. I giver she does exercises to begin mobilizing her shoulder as well.

## 2012-10-19 ENCOUNTER — Other Ambulatory Visit (HOSPITAL_BASED_OUTPATIENT_CLINIC_OR_DEPARTMENT_OTHER): Payer: Medicare Other | Admitting: Lab

## 2012-10-19 ENCOUNTER — Encounter: Payer: Self-pay | Admitting: Physician Assistant

## 2012-10-19 ENCOUNTER — Ambulatory Visit (HOSPITAL_BASED_OUTPATIENT_CLINIC_OR_DEPARTMENT_OTHER): Payer: Medicare Other

## 2012-10-19 ENCOUNTER — Ambulatory Visit (HOSPITAL_BASED_OUTPATIENT_CLINIC_OR_DEPARTMENT_OTHER): Payer: Medicare Other | Admitting: Physician Assistant

## 2012-10-19 VITALS — BP 96/94 | HR 72 | Temp 97.1°F | Resp 18

## 2012-10-19 DIAGNOSIS — G629 Polyneuropathy, unspecified: Secondary | ICD-10-CM

## 2012-10-19 DIAGNOSIS — C50511 Malignant neoplasm of lower-outer quadrant of right female breast: Secondary | ICD-10-CM

## 2012-10-19 DIAGNOSIS — C50519 Malignant neoplasm of lower-outer quadrant of unspecified female breast: Secondary | ICD-10-CM

## 2012-10-19 DIAGNOSIS — Z9010 Acquired absence of unspecified breast and nipple: Secondary | ICD-10-CM

## 2012-10-19 DIAGNOSIS — Z5112 Encounter for antineoplastic immunotherapy: Secondary | ICD-10-CM

## 2012-10-19 DIAGNOSIS — R259 Unspecified abnormal involuntary movements: Secondary | ICD-10-CM

## 2012-10-19 DIAGNOSIS — F329 Major depressive disorder, single episode, unspecified: Secondary | ICD-10-CM

## 2012-10-19 DIAGNOSIS — C50911 Malignant neoplasm of unspecified site of right female breast: Secondary | ICD-10-CM

## 2012-10-19 DIAGNOSIS — Z170 Estrogen receptor positive status [ER+]: Secondary | ICD-10-CM

## 2012-10-19 DIAGNOSIS — K5900 Constipation, unspecified: Secondary | ICD-10-CM

## 2012-10-19 LAB — CBC WITH DIFFERENTIAL/PLATELET
BASO%: 0.8 % (ref 0.0–2.0)
Basophils Absolute: 0.1 10e3/uL (ref 0.0–0.1)
EOS%: 2 % (ref 0.0–7.0)
Eosinophils Absolute: 0.2 10e3/uL (ref 0.0–0.5)
HCT: 37.8 % (ref 34.8–46.6)
HGB: 12.3 g/dL (ref 11.6–15.9)
LYMPH%: 37.5 % (ref 14.0–49.7)
MCH: 33.5 pg (ref 25.1–34.0)
MCHC: 32.5 g/dL (ref 31.5–36.0)
MCV: 103 fL — ABNORMAL HIGH (ref 79.5–101.0)
MONO#: 0.6 10e3/uL (ref 0.1–0.9)
MONO%: 7.6 % (ref 0.0–14.0)
NEUT#: 3.8 10e3/uL (ref 1.5–6.5)
NEUT%: 52.1 % (ref 38.4–76.8)
Platelets: 549 10e3/uL — ABNORMAL HIGH (ref 145–400)
RBC: 3.67 10e6/uL — ABNORMAL LOW (ref 3.70–5.45)
RDW: 13.3 % (ref 11.2–14.5)
WBC: 7.3 10e3/uL (ref 3.9–10.3)
lymph#: 2.8 10e3/uL (ref 0.9–3.3)
nRBC: 0 % (ref 0–0)

## 2012-10-19 LAB — COMPREHENSIVE METABOLIC PANEL (CC13)
ALT: 27 U/L (ref 0–55)
AST: 24 U/L (ref 5–34)
Albumin: 2.8 g/dL — ABNORMAL LOW (ref 3.5–5.0)
Alkaline Phosphatase: 115 U/L (ref 40–150)
BUN: 15.8 mg/dL (ref 7.0–26.0)
CO2: 28 meq/L (ref 22–29)
Calcium: 9.3 mg/dL (ref 8.4–10.4)
Chloride: 106 meq/L (ref 98–107)
Creatinine: 0.8 mg/dL (ref 0.6–1.1)
Glucose: 117 mg/dL — ABNORMAL HIGH (ref 70–99)
Potassium: 3.7 meq/L (ref 3.5–5.1)
Sodium: 144 meq/L (ref 136–145)
Total Bilirubin: <0.2 mg/dL (ref 0.20–1.20)
Total Protein: 6.4 g/dL (ref 6.4–8.3)

## 2012-10-19 MED ORDER — HEPARIN SOD (PORK) LOCK FLUSH 100 UNIT/ML IV SOLN
500.0000 [IU] | Freq: Once | INTRAVENOUS | Status: AC | PRN
  Administered 2012-10-19: 500 [IU]
  Filled 2012-10-19: qty 5

## 2012-10-19 MED ORDER — DULOXETINE HCL 20 MG PO CPEP: 20.0000 mg | ORAL_CAPSULE | Freq: Every day | ORAL | Status: DC

## 2012-10-19 MED ORDER — FUROSEMIDE 20 MG PO TABS: 20.0000 mg | ORAL_TABLET | Freq: Two times a day (BID) | ORAL | Status: DC

## 2012-10-19 MED ORDER — SODIUM CHLORIDE 0.9 % IV SOLN
Freq: Once | INTRAVENOUS | Status: AC
  Administered 2012-10-19: 16:00:00 via INTRAVENOUS

## 2012-10-19 MED ORDER — DIPHENHYDRAMINE HCL 25 MG PO CAPS
50.0000 mg | ORAL_CAPSULE | Freq: Once | ORAL | Status: AC
  Administered 2012-10-19: 25 mg via ORAL

## 2012-10-19 MED ORDER — SODIUM CHLORIDE 0.9 % IJ SOLN
10.0000 mL | INTRAMUSCULAR | Status: DC | PRN
Start: 2012-10-19 — End: 2012-10-19
  Administered 2012-10-19: 10 mL
  Filled 2012-10-19: qty 10

## 2012-10-19 MED ORDER — TRASTUZUMAB CHEMO INJECTION 440 MG
6.0000 mg/kg | Freq: Once | INTRAVENOUS | Status: AC
  Administered 2012-10-19: 546 mg via INTRAVENOUS
  Filled 2012-10-19: qty 26

## 2012-10-19 MED ORDER — ACETAMINOPHEN 325 MG PO TABS
650.0000 mg | ORAL_TABLET | Freq: Once | ORAL | Status: AC
  Administered 2012-10-19: 650 mg via ORAL

## 2012-10-19 NOTE — Patient Instructions (Signed)
[facility] Discharge Instructions for Patients Receiving Chemotherapy  Today you received the following chemotherapy agents Herceptin  To help prevent nausea and vomiting after your treatment, we encourage you to take your nausea medication Phenergan 25mg , Zofran 8 mg  Begin taking it at anytime upon discharge and take it as often as prescribed for the next 72 hours.   If you develop nausea and vomiting that is not controlled by your nausea medication, call the clinic. If it is after clinic hours your family physician or the after hours number for the clinic or go to the Emergency Department.   BELOW ARE SYMPTOMS THAT SHOULD BE REPORTED IMMEDIATELY:  *FEVER GREATER THAN 100.5 F  *CHILLS WITH OR WITHOUT FEVER  NAUSEA AND VOMITING THAT IS NOT CONTROLLED WITH YOUR NAUSEA MEDICATION  *UNUSUAL SHORTNESS OF BREATH  *UNUSUAL BRUISING OR BLEEDING  TENDERNESS IN MOUTH AND THROAT WITH OR WITHOUT PRESENCE OF ULCERS  *URINARY PROBLEMS  *BOWEL PROBLEMS  UNUSUAL RASH Items with * indicate a potential emergency and should be followed up as soon as possible.  Please call to let a nurse know about any problems that you may have experienced. Feel free to call the clinic you have any questions or concerns. The clinic phone number is [phone].   I have been informed and understand all the instructions given to me. I know to contact the clinic, my physician, or go to the Emergency Department if any problems should occur. I do not have any questions at this time, but understand that I may call the clinic during office hours   should I have any questions or need assistance in obtaining follow up care.    __________________________________________  _____________  __________ Signature of Patient or Authorized Representative            Date                   Time    __________________________________________ Nurse's Signature

## 2012-10-19 NOTE — Progress Notes (Signed)
Called [name] PA-C to give update on patient status.  Holly reported the following findings today. 1. Twitching to her body and arms 4 to 5 consecutive twitches that occur randomly throughout the day. 2. Started Cymbalta at bedtime with recent hospitalization. 3. Last bm was four days ago.  Takes miralax daily and stool softeners three times a day. 4. Swelling to lower extremities that started with start of chemotherapy.  Feels this has worsened despite taking Lasix 20 mg bid.  This nurse assessed LE's to find right calf feels tighter than left.  Patient says ankles are where swelling is but doesn't feel tight at ankles or appear swollen. 5. Feels a cold coming on due to "burning sensation in her back and chest.  Feels like she could begin to develop a cough.  Afebrile with oral temop = 97.1.

## 2012-10-19 NOTE — Progress Notes (Signed)
ID: Holly Parrish   DOB: 05-Feb-1971  MR#: [id number]  CSN#:[id number]  PCP: Provider Not In System GYN:  SU:  OTHER MD:   HISTORY OF PRESENT ILLNESS: Holly Parrish noted a mass in her right breast about 5 months ago. She did not have any healthcare an insurance and so delayed getting this looked after. She ultimately underwent a mammogram on 02/16/2012. There showed a firm palpable mass right breast 8:00 position measuring 4 cm there was an additional 3 cm lower right axillary lymph node. Ultrasound confirmed the presence of this mass in the breast measuring 2.7 x 2.7 x 2.9 cm. There were 2 large right lower right axillary lymph nodes largest one measuring 4 cm a smaller one measuring 2 x 3.1 cm. She underwent biopsies of both these areas. 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 This is also the ER and PR positive.  STAGE:  Cancer of lower-outer quadrant of Parrish breast  Primary site: Breast (Right)  Staging method: AJCC 7th Edition  Clinical: Stage IIB (T2, N1, cM0)  Summary: Stage IIB (T2, N1, cM0)  Remainder history as noted below.  INTERVAL HISTORY: Holly Parrish  returns today accompanied by her husband for followup of her locally advanced right breast carcinoma.  She is seen in the infusion room today while receiving her next q. three-week dose of trastuzumab.  She is status post right mastectomy on 10/06/2012, after which she presented to the emergency room on 10/12/2012 with postoperative fever and pain.  She's feeling much better. She's had no additional fevers or chills. Her pain is better controlled. She is using gabapentin along with 20 mg of Cymbalta at night, and occasionally oxycodone.  She does have some concerns today. She has started noted some "jerking" or "twitching" that appears to come "from nowhere". She thinks this may be associated with the Cymbalta, and thinks it started around the time she started that medication.  She's also had some  constipation since her hospitalization and has had only a small bowel movement in the last 3 days. She is passing gas. She denies any abdominal pain. She's been utilizing 2 tablets of stool softeners along with MiraLAX.  She continues to have swelling in the lower extremities. This typically improves with elevation. The right leg is always slightly more swollen than the left, but only slightly so. This has been the same since she was receiving her chemotherapy, and has not worsened. She takes an occasional dose of Lasix which seems to help.   REVIEW OF SYSTEMS: Fortunately, Holly Parrish has had  no rashes or skin changes. She has no new cough, increased shortness of breath, orthopnea, or PND and denies any chest pain. Her throat feels well scratchy like she "may be getting sick" but currently she denies any evidence of illness. No headaches, dizziness or change in vision.  She denies any signs of abnormal bleeding.  She has had only occasional problems with nausea for which she takes ondansetron effectively.   A detailed review of systems is otherwise stable and noncontributory.   PAST MEDICAL HISTORY: Past Medical History  Diagnosis Date  . Lupus   . Joint pain   . Hearing loss   . Bilateral swelling of feet   . Breast cancer 40  . Cancer 37    Cervical, treated with cryoablation  . Asthma   . Neuromuscular disorder   . Migraines   . Headache   . Arthritis     PAST SURGICAL HISTORY:  Past Surgical History  Procedure Laterality Date  . Tonsillectomy    . Cholecystectomy    . 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 Portacath placement  03/11/2012    Procedure: INSERTION PORT-A-CATH;  Surgeon: Emelia Loron, MD;  Location: [facility];  Service: General;  Laterality: Left;  . Hand surgery  1997  . Appendectomy  1995  . Abdominal exploration surgery  [id number]  . Tubal ligation  1997  . Mastectomy modified radical Right 10/06/2012    Procedure: MASTECTOMY MODIFIED  RADICAL WITH AXILLARY CONTENT;  Surgeon: Emelia Loron, MD;  Location: [facility];  Service: General;  Laterality: Right;    FAMILY HISTORY Family History  Problem Relation Age of Onset  . Uterine cancer Mother 73  . Pancreatic cancer Paternal Uncle     diagnosed in his 81s; smoker; half uncle related through grandmother  . Cancer Paternal Uncle     Throat  . Liver cancer Maternal Grandmother     not a drinker  . Cancer Maternal Grandmother   . Breast cancer Cousin     3 paternal cousins with breast cancer, onset 28, 65 and one bilateral at  63 and 20  . Cancer Cousin     Breast  . Heart attack Father 64  . Breast cancer Paternal Grandmother 62  . Heart attack Paternal Grandfather   . Throat cancer Paternal Aunt     smoker; half uncle related through grandmother  . Breast cancer Paternal Aunt     2 paternal aunts, both with 2 diagnoses of breast cancer each  . Cystic fibrosis Cousin   . Cancer Cousin     Breast  . Cancer Cousin     Breast  . Breast cancer Paternal Aunt     GYNECOLOGIC HISTORY: G5 P5,  one daughter died at age 100, menarche at age 34; age of first live birth is 47; no recent history of birth control pill use; has normal menses.  SOCIAL HISTORY: From [place] where most of her family still resides.  Works as a Fish farm manager.  She has been married twice and recently has been divorced.  She has four children who live in [place].    ADVANCED DIRECTIVES:  HEALTH MAINTENANCE: History  Substance Use Topics  . Smoking status: Never Smoker   . Smokeless tobacco: Never Used  . Alcohol Use: Yes     Comment: occasional drinker     Colonoscopy:  PAP:  Bone density:  Lipid panel:  Allergies  Allergen Reactions  . Bee Venom Anaphylaxis  . Nsaids Anaphylaxis  . Penicillins Anaphylaxis  . Tetanus Toxoids Anaphylaxis  . Vitamin K And Related Anaphylaxis  . Hydrogen Peroxide Other (See Comments)    "It burns until I bleed."  . Banana Other (See  Comments)    Face swells    Current Outpatient Prescriptions  Medication Sig Dispense Refill  . amitriptyline (ELAVIL) 50 MG tablet Take 1 tablet (50 mg total) by mouth at bedtime.  20 tablet  0  . B Complex Vitamins (VITAMIN B COMPLEX PO) Take 1 tablet by mouth daily.       Marland Kitchen docusate sodium (COLACE) 100 MG capsule Take 200 mg by mouth every morning.      . DULoxetine (CYMBALTA) 20 MG capsule Take 1 capsule (20 mg total) by mouth at bedtime.  30 capsule  0  . EPINEPHrine (EPIPEN 2-PAK) 0.3 mg/0.3 mL DEVI Inject 0.3 mg into the muscle once. Bee venom      .  furosemide (LASIX) 20 MG tablet Take 1 tablet (20 mg total) by mouth 2 (two) times daily.  30 tablet  0  . gabapentin (NEURONTIN) 300 MG capsule Take 3 capsules (900 mg total) by mouth 4 (four) times daily.  90 capsule  1  . lidocaine-prilocaine (EMLA) cream Apply topically as needed.  30 g  0  . ondansetron (ZOFRAN) 8 MG tablet Take 1 tablet (8 mg total) by mouth every 12 (twelve) hours as needed for nausea.  20 tablet  0  . oxyCODONE 10 MG TABS Take 1 tablet (10 mg total) by mouth every 4 (four) hours as needed for pain.  50 tablet  0  . PRESCRIPTION MEDICATION Inject into the vein every 30 (thirty) days. trastuzumab (HERCEPTIN) 546 mg in sodium chloride 0.9 % 250 mL chemo infusion   6 mg/kg, 552 mL/hr      . promethazine (PHENERGAN) 25 MG tablet Take 1 tablet by mouth every 6 to 8 hrs as needed for nausea or vomiting  30 tablet  0   No current facility-administered medications for this visit.   Facility-Administered Medications Ordered in Other Visits  Medication Dose Route Frequency Provider Last Rate Last Dose  . heparin lock flush 100 unit/mL  500 Units Intracatheter Once PRN Carmelle Bamberg G Lachell Rochette, PA-C      . sodium chloride 0.9 % injection 10 mL  10 mL Intracatheter PRN Damien Batty Allegra Grana, PA-C        OBJECTIVE: Holly Parrish who appears tired and anxious, examined while in the infusion room.  There were no vitals filed for this visit.    There is no weight on file to calculate BMI.    ECOG FS: 2 There were no vitals filed for this visit.  Vitals were drawn in the infusion room, and have been documented and reviewed separately.  Sclerae unicteric Lungs no rales or rhonchi Heart regular rate and rhythm Abdomen soft, nontender, with positive bowel sounds Nonpitting pedal edema, equal bilaterally.  No erythema or palpable cords. No upper extremity edema Neuro: nonfocal, well oriented with anxious affect. Breasts: Deferred Port is accessed in the left upper chest wall.  LAB RESULTS: Lab Results  Component Value Date   WBC 7.3 10/19/2012   NEUTROABS 3.8 10/19/2012   HGB 12.3 10/19/2012   HCT 37.8 10/19/2012   MCV 103.0* 10/19/2012   PLT 549* 10/19/2012      Chemistry      Component Value Date/Time   NA 144 10/19/2012 1444   NA 137 10/11/2012 1130   K 3.7 10/19/2012 1444   K 3.7 10/11/2012 1130   CL 106 10/19/2012 1444   CL 103 10/11/2012 1130   CO2 28 10/19/2012 1444   CO2 26 10/11/2012 1130   BUN 15.8 10/19/2012 1444   BUN 14 10/11/2012 1130   CREATININE 0.8 10/19/2012 1444   CREATININE 0.90 10/11/2012 1130      Component Value Date/Time   CALCIUM 9.3 10/19/2012 1444   CALCIUM 8.9 10/11/2012 1130   ALKPHOS 115 10/19/2012 1444   ALKPHOS 73 05/07/2012 0950   AST 24 10/19/2012 1444   AST 18 05/07/2012 0950   ALT 27 10/19/2012 1444   ALT 23 05/07/2012 0950   BILITOT <0.20 Repeated and Verified 10/19/2012 1444   BILITOT 0.4 05/07/2012 0950       Lab Results  Component Value Date   LABCA2 11 05/28/2012     STUDIES: Most recent echocardiogram on 08/17/2012 showed a well preserved ejection fraction of 55-60%.  ASSESSMENT: 42 y.o.   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  Palpable right axillary adenopathy at presentation.  (2)  Being treated in the neoadjuvant setting.   Status post:  (a)  3 cycles of Taxotere/carbo with Herceptin, with poor tolerance  (b)  One cycle of weekly Carbo/Taxol and Herceptin in early November 2013, with poor tolerance  (c)  2 doses of weekly Carbo/Gemzar with Herceptin with poor tolerance and worsening neuropathy.  Last dose of chemo given in early December 2013.  (3)  Herceptin to be continued for a total of one year.  (4)  status post right mastectomy under the care of Dr. Dwain Sarna on 10/06/2012 with a residual 1.5 cm invasive ductal carcinoma, grade 2. Tumor was focally 0.1 cm firm deep margins. 0 of 22 lymph nodes were involved. There was evidence of lymphovascular invasion.   PLAN: Lekia will receive trastuzumab today as planned, and we will continue to treat her on a Q. three-week basis. I will see her when she returns in 3 weeks, April 15, for her next dose. She's been scheduled for her next echocardiogram which will be in late April.  With regards to her pain control, she will stay on her current regimen, but we are also referring her to Dr. Tamera Punt for further pain management. She is worried about the "twitching" and thinks this may be secondary to the Cymbalta. She tells me the tablets are scored, and she will break them in half to see if this decreases the problem. If not, she will let us know, and certainly if it worsens she will call right away.  With regards to the constipation, she will try some magnesium citrate tonight and if she has not had a good bowel movement but tomorrow morning we'll give [place] a call at which time we will obtain a KUB for further evaluation. Once her bowels started moving, it will be important that she continues with her previous regimen of stool softeners with MiraLAX. All this information was given to [facility] today in writing.   proceed to treatment today as scheduled for her next dose of trastuzumab.  Over half of her one hour appointment was spent counseling the patient regarding her diagnosis,  her current complaints, and her treatment plan from this point forward.  Arion will see her surgeon again later this week, hoping to have her drain pulled. I will see her back in 3 weeks as noted above.  Polette voices understanding and agreement with our plan, and will call if she has any changes or problems prior to her appointment here next week.   Shenay Torti    10/19/2012

## 2012-10-20 ENCOUNTER — Other Ambulatory Visit: Payer: Self-pay | Admitting: Physician Assistant

## 2012-10-20 DIAGNOSIS — C50519 Malignant neoplasm of lower-outer quadrant of unspecified female breast: Secondary | ICD-10-CM

## 2012-10-20 DIAGNOSIS — G629 Polyneuropathy, unspecified: Secondary | ICD-10-CM

## 2012-10-20 DIAGNOSIS — M329 Systemic lupus erythematosus, unspecified: Secondary | ICD-10-CM

## 2012-10-21 ENCOUNTER — Telehealth: Payer: Self-pay | Admitting: *Deleted

## 2012-10-21 ENCOUNTER — Other Ambulatory Visit: Payer: Self-pay | Admitting: Physician Assistant

## 2012-10-21 DIAGNOSIS — C50519 Malignant neoplasm of lower-outer quadrant of unspecified female breast: Secondary | ICD-10-CM

## 2012-10-21 DIAGNOSIS — G629 Polyneuropathy, unspecified: Secondary | ICD-10-CM

## 2012-10-21 DIAGNOSIS — M329 Systemic lupus erythematosus, unspecified: Secondary | ICD-10-CM

## 2012-10-21 MED ORDER — OXYCODONE HCL 10 MG PO TABS: 10.0000 mg | ORAL_TABLET | ORAL | Status: DC | PRN

## 2012-10-21 NOTE — Telephone Encounter (Signed)
Called pt gv appts for 11/09/12. Advised pt to come by on that date and get an printout of her schedule. i also made an appt for her Echo. Pt is aware that i am waiting on Dr. Manon Hilding to return my call with her an appt d/t.

## 2012-10-21 NOTE — Telephone Encounter (Signed)
Per staff phone call and POF I have schedueld appts.  [name]  

## 2012-10-22 ENCOUNTER — Encounter (INDEPENDENT_AMBULATORY_CARE_PROVIDER_SITE_OTHER): Payer: Self-pay | Admitting: General Surgery

## 2012-10-22 ENCOUNTER — Ambulatory Visit (INDEPENDENT_AMBULATORY_CARE_PROVIDER_SITE_OTHER): Payer: Medicare Other | Admitting: General Surgery

## 2012-10-22 VITALS — BP 98/64 | HR 72 | Temp 97.4°F | Resp 12 | Ht 62.0 in | Wt 211.0 lb

## 2012-10-22 DIAGNOSIS — Z09 Encounter for follow-up examination after completed treatment for conditions other than malignant neoplasm: Secondary | ICD-10-CM

## 2012-10-22 NOTE — Progress Notes (Signed)
Subjective:     Patient ID: Holly Parrish, female   DOB: 11-30-1970, 42 y.o.   MRN: [id number]  HPI 42 yof s/p right mrm with pain control difficulties afterwards as well as exacerbation of her preexisting neuropathic pain esp in feet.  She returns today for drain removal.    Review of Systems     Objective:   Physical Exam Incision without infection, still with a fold/swelling in axilla but this should improve, drain with minimal serous fluid    Assessment:     S/p right mrm    Plan:     I will see next week to ensure she is healing.  She can shower.

## 2012-10-28 ENCOUNTER — Encounter (INDEPENDENT_AMBULATORY_CARE_PROVIDER_SITE_OTHER): Payer: Self-pay | Admitting: General Surgery

## 2012-10-28 ENCOUNTER — Ambulatory Visit (INDEPENDENT_AMBULATORY_CARE_PROVIDER_SITE_OTHER): Payer: Medicare Other | Admitting: General Surgery

## 2012-10-28 VITALS — BP 130/80 | HR 69 | Temp 98.6°F | Resp 18 | Ht 62.0 in | Wt 201.0 lb

## 2012-10-28 DIAGNOSIS — Z09 Encounter for follow-up examination after completed treatment for conditions other than malignant neoplasm: Secondary | ICD-10-CM

## 2012-10-28 DIAGNOSIS — C50919 Malignant neoplasm of unspecified site of unspecified female breast: Secondary | ICD-10-CM

## 2012-10-28 NOTE — Discharge Summary (Signed)
Physician Discharge Summary  Patient ID: Holly Parrish MRN: [id number] DOB/AGE: 03-24-1971 42 y.o.  Admit date: 10/11/2012 Discharge date: 10/28/2012  Admission Diagnoses: Postoperative fever Breast cancer s/p chemo/right mrm  Discharge Diagnoses:  Active Problems:   * No active hospital problems. *   Discharged Condition: good  Hospital Course: 34 yof who underwent primary chemotherapy with which she had a lot of difficulty. She then underwent right mrm with difficulty with pain control as well as reactivation of her peripheral neuropathy.  She was admitted by one of my partners with postop fever and pain. Her fever never really materialized into anything and I think this may have been viral illness.  Her pain medications were adjusted and she was discharged home.  Consults: hematology/oncology  Significant Diagnostic Studies: ct scan chest  Treatments: IV hydration  Disposition: 01-Home or Self Care   Future Appointments Provider Department Dept Phone   10/28/2012 4:30 PM Emelia Loron, MD [facility], [place] [phone]   11/09/2012 12:45 PM Beverely Pace [facility] [phone]   11/09/2012 1:15 PM Amy Wenda [place] [facility] [phone]   11/09/2012 2:30 PM [facility] [place] [facility] [phone]   11/16/2012 8:00 AM [facility] ECHO LAB [phone]   11/16/2012 9:00 AM [facility] [phone]   11/30/2012 8:30 AM Windell Hummingbird [facility] MEDICAL ONCOLOGY [phone]   11/30/2012 9:00 AM [facility] MED[facility] ONCOL[facility] [phone]   12/21/2012 3:00 PM Windell Hummingbird [facility] MEDICAL ONCOLOGY [phone]   12/21/2012 3:30 PM Lowella Dell, MD [facility] MEDICAL ONCOLOGY [phone]   12/21/2012  4:15 PM [facility] MED[facility] ONCOL[facility] [phone]       Medication List    STOP taking these medications       oxyCODONE-acetaminophen 5-325 MG per tablet  Commonly known as:  ROXICET      TAKE these medications       amitriptyline 50 MG tablet  Commonly known as:  ELAVIL  Take 1 tablet (50 mg total) by mouth at bedtime.     docusate sodium 100 MG capsule  Commonly known as:  COLACE  Take 200 mg by mouth every morning.     EPIPEN 2-PAK 0.3 mg/0.3 mL Devi  Generic drug:  EPINEPHrine  Inject 0.3 mg into the muscle once. Bee venom     gabapentin 300 MG capsule  Commonly known as:  NEURONTIN  Take 3 capsules (900 mg total) by mouth 4 (four) times daily.     lidocaine-prilocaine cream  Commonly known as:  EMLA  Apply topically as needed.     ondansetron 8 MG tablet  Commonly known as:  ZOFRAN  Take 1 tablet (8 mg total) by mouth every 12 (twelve) hours as needed for nausea.     PRESCRIPTION MEDICATION  Inject into the vein every 30 (thirty) days. trastuzumab (HERCEPTIN) 546 mg in sodium chloride 0.9 % 250 mL chemo infusion   6 mg/kg, 552 mL/hr     promethazine 25 MG tablet  Commonly known as:  PHENERGAN  Take 1 tablet by mouth every 6 to 8 hrs as needed for nausea or vomiting     VITAMIN B COMPLEX PO  Take 1 tablet by mouth daily.           Follow-up Information  Follow up with Emelia Loron, MD In 1 week.   Contact information:   [address] [place] [address] [phone]       Signed: Emelia Loron 10/28/2012, 2:18 PM

## 2012-10-28 NOTE — Progress Notes (Signed)
Subjective:     Patient ID: Holly Parrish, female   DOB: 1971-01-15, 42 y.o.   MRN: [id number]  HPI 42 yof s/p right mrm recently s/p primary chemotherapy now with drains out. She is having some difficulty with her arm lifting it up.  She has significant peripheral neuropathy in her feet.  She is doing well otherwise.  Review of Systems     Objective:   Physical Exam Right sided mastectomy incision clean without infection, no seroma    Assessment:     S/p right mrm      Plan:     PT referral  Will see back in a few months

## 2012-11-09 ENCOUNTER — Telehealth: Payer: Self-pay | Admitting: *Deleted

## 2012-11-09 ENCOUNTER — Ambulatory Visit (HOSPITAL_BASED_OUTPATIENT_CLINIC_OR_DEPARTMENT_OTHER): Payer: Medicare Other | Admitting: Physician Assistant

## 2012-11-09 ENCOUNTER — Other Ambulatory Visit: Payer: Self-pay | Admitting: Oncology

## 2012-11-09 ENCOUNTER — Ambulatory Visit: Payer: Medicare Other

## 2012-11-09 ENCOUNTER — Other Ambulatory Visit (HOSPITAL_BASED_OUTPATIENT_CLINIC_OR_DEPARTMENT_OTHER): Payer: Medicare Other | Admitting: Lab

## 2012-11-09 ENCOUNTER — Ambulatory Visit (HOSPITAL_BASED_OUTPATIENT_CLINIC_OR_DEPARTMENT_OTHER): Payer: Medicare Other

## 2012-11-09 ENCOUNTER — Ambulatory Visit (HOSPITAL_COMMUNITY)
Admission: RE | Admit: 2012-11-09 | Discharge: 2012-11-09 | Disposition: A | Discharge status: Home / Self Care | Payer: Medicare Other | Source: Ambulatory Visit | Attending: Physician Assistant | Admitting: Physician Assistant

## 2012-11-09 ENCOUNTER — Encounter: Payer: Self-pay | Admitting: Physician Assistant

## 2012-11-09 VITALS — BP 118/79 | HR 61 | Temp 97.5°F | Resp 20 | Ht 62.0 in | Wt 202.1 lb

## 2012-11-09 DIAGNOSIS — C50511 Malignant neoplasm of lower-outer quadrant of right female breast: Secondary | ICD-10-CM

## 2012-11-09 DIAGNOSIS — R4702 Dysphasia: Secondary | ICD-10-CM

## 2012-11-09 DIAGNOSIS — E8989 Other postprocedural endocrine and metabolic complications and disorders: Secondary | ICD-10-CM

## 2012-11-09 DIAGNOSIS — M329 Systemic lupus erythematosus, unspecified: Secondary | ICD-10-CM

## 2012-11-09 DIAGNOSIS — IMO0002 Reserved for concepts with insufficient information to code with codable children: Secondary | ICD-10-CM

## 2012-11-09 DIAGNOSIS — R51 Headache: Secondary | ICD-10-CM

## 2012-11-09 DIAGNOSIS — N898 Other specified noninflammatory disorders of vagina: Secondary | ICD-10-CM

## 2012-11-09 DIAGNOSIS — Z5112 Encounter for antineoplastic immunotherapy: Secondary | ICD-10-CM

## 2012-11-09 DIAGNOSIS — R42 Dizziness and giddiness: Secondary | ICD-10-CM

## 2012-11-09 DIAGNOSIS — C50519 Malignant neoplasm of lower-outer quadrant of unspecified female breast: Secondary | ICD-10-CM

## 2012-11-09 DIAGNOSIS — M7989 Other specified soft tissue disorders: Secondary | ICD-10-CM

## 2012-11-09 DIAGNOSIS — G629 Polyneuropathy, unspecified: Secondary | ICD-10-CM

## 2012-11-09 LAB — CBC WITH DIFFERENTIAL/PLATELET
Basophils Absolute: 0.1 10*3/uL (ref 0.0–0.1)
Eosinophils Absolute: 0.2 10*3/uL (ref 0.0–0.5)
HCT: 38.3 % (ref 34.8–46.6)
HGB: 12.8 g/dL (ref 11.6–15.9)
MCV: 98.2 fL (ref 79.5–101.0)
MONO%: 8.8 % (ref 0.0–14.0)
NEUT#: 2.7 10*3/uL (ref 1.5–6.5)
NEUT%: 34.3 % — ABNORMAL LOW (ref 38.4–76.8)
Platelets: 389 10*3/uL (ref 145–400)
RDW: 12.7 % (ref 11.2–14.5)

## 2012-11-09 LAB — COMPREHENSIVE METABOLIC PANEL (CC13)
Albumin: 3.5 g/dL (ref 3.5–5.0)
Alkaline Phosphatase: 99 U/L (ref 40–150)
BUN: 21.4 mg/dL (ref 7.0–26.0)
Calcium: 9.4 mg/dL (ref 8.4–10.4)
Glucose: 87 mg/dl (ref 70–99)
Potassium: 3.9 mEq/L (ref 3.5–5.1)

## 2012-11-09 MED ORDER — TRASTUZUMAB CHEMO INJECTION 440 MG
6.0000 mg/kg | Freq: Once | INTRAVENOUS | Status: AC
  Administered 2012-11-09: 546 mg via INTRAVENOUS
  Filled 2012-11-09: qty 26

## 2012-11-09 MED ORDER — ACETAMINOPHEN 325 MG PO TABS
650.0000 mg | ORAL_TABLET | Freq: Once | ORAL | Status: AC
  Administered 2012-11-09: 650 mg via ORAL

## 2012-11-09 MED ORDER — DIPHENHYDRAMINE HCL 25 MG PO CAPS
50.0000 mg | ORAL_CAPSULE | Freq: Once | ORAL | Status: AC
  Administered 2012-11-09: 25 mg via ORAL

## 2012-11-09 MED ORDER — HEPARIN SOD (PORK) LOCK FLUSH 100 UNIT/ML IV SOLN
500.0000 [IU] | Freq: Once | INTRAVENOUS | Status: AC | PRN
  Administered 2012-11-09: 500 [IU]
  Filled 2012-11-09: qty 5

## 2012-11-09 MED ORDER — OXYCODONE HCL 10 MG PO TABS: 10.0000 mg | ORAL_TABLET | ORAL | Status: DC | PRN

## 2012-11-09 MED ORDER — SODIUM CHLORIDE 0.9 % IV SOLN
Freq: Once | INTRAVENOUS | Status: AC
  Administered 2012-11-09: 16:00:00 via INTRAVENOUS

## 2012-11-09 MED ORDER — SODIUM CHLORIDE 0.9 % IJ SOLN
10.0000 mL | INTRAMUSCULAR | Status: DC | PRN
  Administered 2012-11-09: 10 mL
  Filled 2012-11-09: qty 10

## 2012-11-09 NOTE — Progress Notes (Signed)
ID: Holly Parrish   DOB: [birth year]  MR#: [id number]  WUJ#:[id number]  PCP: No PCP Per Patient GYN:  SU: WAKEFIELD, MATT, MD OTHER MD:   HISTORY OF PRESENT ILLNESS: Holly Parrish noted a mass in her right breast about 5 months ago. She did not have any healthcare an insurance and so delayed getting this looked after. She ultimately underwent a mammogram on 02/16/2012. There showed a firm palpable mass right breast 8:00 position measuring 4 cm there was an additional 3 cm lower right axillary lymph node. Ultrasound confirmed the presence of this mass in the breast measuring 2.7 x 2.7 x 2.9 cm. There were 2 large right lower right axillary lymph nodes largest one measuring 4 cm a smaller one measuring 2 x 3.1 cm. She underwent biopsies of both these areas. 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 This is also the ER and PR positive.  STAGE:  Cancer of lower-outer quadrant of female breast  Primary site: Breast (Right)  Staging method: AJCC 7th Edition  Clinical: Stage IIB (T2, N1, cM0)  Summary: Stage IIB (T2, N1, cM0)  Remainder history as noted below.  INTERVAL HISTORY: Holly Parrish  returns today accompanied by her husband for followup of her right breast carcinoma.  She continues to receive trastuzumab every 3 weeks, and is due for her next dose today.  She is status post right mastectomy on 10/06/2012.    Holly Parrish continues to have multiple concerns and complaints today. She continues to have severe neuropathy, especially in the lower extremities. She tearfully tells me she "just can't stand it". We have referred her for further pain evaluation with Dr. Tamera Punt, but that appointment has not yet been made. She continues on Cymbalta, but has decreased to 10 mg daily. She is taking 900 mg of gabapentin 4 times daily, and continues to take oxycodone 10 mg tablets as needed for pain. She does need a refill on oxycodone today. Overall, she tells me this pain regimen  "helps a little" but gives her only minimal relief. 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    She continues to have swelling in the lower extremities. This typically improves slightly with elevation. The right leg is always slightly more swollen than the left, but only slightly so. This has been the same since she was receiving her chemotherapy.  Some days are worse than others. She continues to have some dyscrasia on the lower extremities and the setting her feet in [place] as previously discussed. She has some lymphedema in the right upper extremity status post surgery, and has some limited range of motion in the arm as well. She has been referred to the lymphedema clinic for further evaluation.  Perhaps most concerning, however, is the fact that [facility] tells me she has had increased headaches, at least every other day. This does seem to have increased significantly over the last few weeks. She has noticed increased dizziness. She previously had some blurred vision, but notes that her vision seems "darker". Chest occasional nausea but no emesis. She feels like she leans to the right sometimes when she walks. She's had no seizure activity or loss of consciousness, and denies any falls or head injuries. She also finds that she occasionally has difficulty thinking of the "right words" when she is having a conversation which frustrates her a great deal.  REVIEW OF SYSTEMS: Holly Parrish denies any fevers or  chills. She has had  no rashes or skin changes. She has no new cough, increased shortness of breath, orthopnea, or PND and denies any chest pain.   She denies any signs of abnormal bruising or bleeding.  She's having regular bowel movements. She has not had any menstrual cycles, although she occasionally has some pelvic cramping, and some occasional vaginal discharge. She does not have an established gynecologist.   A detailed review of systems is  otherwise stable and noncontributory.   PAST MEDICAL HISTORY: Past Medical History  Diagnosis Date  . Lupus   . Joint pain   . Hearing loss   . Bilateral swelling of feet   . Breast cancer 40  . Cancer 37    Cervical, treated with cryoablation  . Asthma   . Neuromuscular disorder   . Migraines   . Headache   . Arthritis     PAST SURGICAL HISTORY: Past Surgical History  Procedure Laterality Date  . Tonsillectomy    . Cholecystectomy    . 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 Portacath placement  03/11/2012    Procedure: INSERTION PORT-A-CATH;  Surgeon: Emelia Loron, MD;  Location: [facility];  Service: General;  Laterality: Left;  . Hand surgery  1997  . Appendectomy  1995  . Abdominal exploration surgery  [id number]  . Tubal ligation  1997  . Mastectomy modified radical Right 10/06/2012    Procedure: MASTECTOMY MODIFIED RADICAL WITH AXILLARY CONTENT;  Surgeon: Emelia Loron, MD;  Location: [facility];  Service: General;  Laterality: Right;    FAMILY HISTORY Family History  Problem Relation Age of Onset  . Uterine cancer Mother 73  . Pancreatic cancer Paternal Uncle     diagnosed in his 42s; smoker; half uncle related through grandmother  . Cancer Paternal Uncle     Throat  . Liver cancer Maternal Grandmother     not a drinker  . Cancer Maternal Grandmother   . Breast cancer Cousin     3 paternal cousins with breast cancer, onset 23, 93 and one bilateral at  25 and 1  . Cancer Cousin     Breast  . Heart attack Father 63  . Breast cancer Paternal Grandmother 75  . Heart attack Paternal Grandfather   . Throat cancer Paternal Aunt     smoker; half uncle related through grandmother  . Breast cancer Paternal Aunt     2 paternal aunts, both with 2 diagnoses of breast cancer each  . Cystic fibrosis Cousin   . Cancer Cousin     Breast  . Cancer Cousin     Breast  . Breast cancer Paternal Aunt     GYNECOLOGIC HISTORY: G5 P5,  one daughter  died at age 70, menarche at age 77; age of first live birth is 30; no recent history of birth control pill use; has normal menses.  SOCIAL HISTORY: From [place] where most of her family still resides.  Works as a Fish farm manager.  She has been married twice and recently has been divorced.  She has four children who live in [place].    ADVANCED DIRECTIVES:  HEALTH MAINTENANCE: History  Substance Use Topics  . Smoking status: Never Smoker   . Smokeless tobacco: Never Used  . Alcohol Use: Yes     Comment: occasional drinker     Colonoscopy:  PAP:  Bone density:  Lipid panel:  Allergies  Allergen Reactions  . Bee Venom Anaphylaxis  . Nsaids  Anaphylaxis  . Penicillins Anaphylaxis  . Tetanus Toxoids Anaphylaxis  . Vitamin K And Related Anaphylaxis  . Hydrogen Peroxide Other (See Comments)    "It burns until I bleed."  . Banana Other (See Comments)    Face swells    Current Outpatient Prescriptions  Medication Sig Dispense Refill  . amitriptyline (ELAVIL) 50 MG tablet Take 1 tablet (50 mg total) by mouth at bedtime.  20 tablet  0  . B Complex Vitamins (VITAMIN B COMPLEX PO) Take 1 tablet by mouth daily.       Marland Kitchen docusate sodium (COLACE) 100 MG capsule Take 200 mg by mouth every morning.      . DULoxetine (CYMBALTA) 20 MG capsule Take 1 capsule (20 mg total) by mouth at bedtime.  30 capsule  0  . EPINEPHrine (EPIPEN 2-PAK) 0.3 mg/0.3 mL DEVI Inject 0.3 mg into the muscle once. Bee venom      . furosemide (LASIX) 20 MG tablet Take 1 tablet (20 mg total) by mouth 2 (two) times daily.  30 tablet  0  . gabapentin (NEURONTIN) 300 MG capsule Take 3 capsules (900 mg total) by mouth 4 (four) times daily.  90 capsule  1  . lidocaine-prilocaine (EMLA) cream Apply topically as needed.  30 g  0  . ondansetron (ZOFRAN) 8 MG tablet Take 1 tablet (8 mg total) by mouth every 12 (twelve) hours as needed for nausea.  20 tablet  0  . Oxycodone HCl 10 MG TABS Take 1 tablet (10 mg total) by mouth  every 4 (four) hours as needed.  60 tablet  0  . PRESCRIPTION MEDICATION Inject into the vein every 30 (thirty) days. trastuzumab (HERCEPTIN) 546 mg in sodium chloride 0.9 % 250 mL chemo infusion   6 mg/kg, 552 mL/hr      . promethazine (PHENERGAN) 25 MG tablet Take 1 tablet by mouth every 6 to 8 hrs as needed for nausea or vomiting  30 tablet  0   No current facility-administered medications for this visit.   Facility-Administered Medications Ordered in Other Visits  Medication Dose Route Frequency Provider Last Rate Last Dose  . sodium chloride 0.9 % injection 10 mL  10 mL Intracatheter PRN Catalina Gravel, PA-C   10 mL at 11/09/12 1633    OBJECTIVE: Young white female who appears tired, anxious, and tearful   Filed Vitals:   11/09/12 1409  BP: 118/79  Pulse: 61  Temp: 97.5 F (36.4 C)  Resp: 20     Body mass index is 36.96 kg/(m^2).    ECOG FS: 2 Filed Weights   11/09/12 1409  Weight: 202 lb 1.6 oz (91.672 kg)   Sclerae unicteric Oropharynx is clear with no oral ulcerations or evidence of candidiasis noted Lungs clear to auscultation bilaterally. no rales or rhonchi Heart regular rate and rhythm Abdomen soft, nontender, with positive bowel sounds Nonpitting pedal edema, equal bilaterally.  No erythema or palpable cords. No upper extremity edema Neuro: nonfocal, well oriented with anxious affect. Breasts: Patient status post right mastectomy with well-healed incision and no evidence of drainage or infection. Port is intact in the left upper chest wall with no erythema, edema, or evidence of infection.   LAB RESULTS: Lab Results  Component Value Date   WBC 7.8 11/09/2012   NEUTROABS 2.7 11/09/2012   HGB 12.8 11/09/2012   HCT 38.3 11/09/2012   MCV 98.2 11/09/2012   PLT 389 11/09/2012      Chemistry  Component Value Date/Time   NA 144 11/09/2012 1345   NA 137 10/11/2012 1130   K 3.9 11/09/2012 1345   K 3.7 10/11/2012 1130   CL 107 11/09/2012 1345   CL 103 10/11/2012 1130    CO2 26 11/09/2012 1345   CO2 26 10/11/2012 1130   BUN 21.4 11/09/2012 1345   BUN 14 10/11/2012 1130   CREATININE 0.9 11/09/2012 1345   CREATININE 0.90 10/11/2012 1130      Component Value Date/Time   CALCIUM 9.4 11/09/2012 1345   CALCIUM 8.9 10/11/2012 1130   ALKPHOS 99 11/09/2012 1345   ALKPHOS 73 05/07/2012 0950   AST 20 11/09/2012 1345   AST 18 05/07/2012 0950   ALT 14 11/09/2012 1345   ALT 23 05/07/2012 0950   BILITOT 0.36 11/09/2012 1345   BILITOT 0.4 05/07/2012 0950       Lab Results  Component Value Date   LABCA2 11 05/28/2012     STUDIES: Most recent echocardiogram on 08/17/2012 showed a well preserved ejection fraction of 55-60%.   ASSESSMENT: 42 y.o.   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  Palpable right axillary adenopathy at presentation.  (2)  Being treated in the neoadjuvant setting.  Status post:  (a)  3 cycles of Taxotere/carbo with Herceptin, with poor tolerance  (b)  One cycle of weekly Carbo/Taxol and Herceptin in early November 2013, with poor tolerance  (c)  2 doses of weekly Carbo/Gemzar with Herceptin with poor tolerance and worsening neuropathy.  Last dose of chemo given in early December 2013.  (3)  Herceptin to be continued for a total of one year.  (4)  status post right mastectomy under the care of Dr. Dwain Sarna on 10/06/2012 with a residual 1.5 cm invasive ductal carcinoma, grade 2. Tumor was focally 0.1 cm firm deep margins. 0 of 22 lymph nodes were involved. There was evidence of lymphovascular invasion.   PLAN:  Over half of our 50 minute appointment today was spent reviewing this disease many concerns, discussing her treatment plan, and coordinating care.  Keyleen will receive trastuzumab today as planned, and we will continue to treat her on a Q. three-week basis.  She's already scheduled for an  echocardiogram next week. She'll be seeing Dr. Darnelle Catalan in followup in 6 weeks with repeat labs and physical exam.  She'll let us know if the swelling in her legs worsens again. We'll place a referral to Dr. Dayton Scrape to review Holly Parrish's final pathology report and discuss postmastectomy radiation therapy.  As soon as we confirm whether or not she will need radiation therapy, we will need to consider antiestrogen therapy as well.  With regards to her pain control, I am refilling her oxycodone as before. We are also checking again on her referral to Dr. Tamera Punt for further pain management, and I am hoping that we'll be able to make that appointment soon.  We're also referring Holly Parrish to the lymphedema clinic for further evaluation of right upper tremor the lymphedema and decreased range of motion status post mastectomy and lymph node removal.  I am also referring Holly Parrish for a brain MRI as soon as possible to evaluate for any abnormal brain lesions in light of her headaches, dizziness, change in vision, and dysphasgia.  Holly Parrish voices understanding and agreement with our plan, and will call if she has any changes or problems prior  to her appointment here next week.   Holly Parrish    11/09/2012

## 2012-11-09 NOTE — Telephone Encounter (Signed)
gv pt appt d/t for the lymphedema clinic. Called and resent referral to Dr. Manon Hilding office kelly admitted to the pt paper work falling thru the cracks. i have faxed the info she requested and medical will fax the other info . i put in a request with [facility] to fax the pt medical records. Pt is aware that i am working on getting her an appt with Dr. Manon Hilding. Pt is also aware that Dr. Hyacinth Meeker do not except Medicare pts at this time...td

## 2012-11-09 NOTE — Patient Instructions (Addendum)
[facility] Discharge Instructions for Patients Receiving Chemotherapy  Today you received the following chemotherapy agents Herceptin  To help prevent nausea and vomiting after your treatment, we encourage you to take your nausea medication     If you develop nausea and vomiting that is not controlled by your nausea medication, call the clinic.   BELOW ARE SYMPTOMS THAT SHOULD BE REPORTED IMMEDIATELY:  *FEVER GREATER THAN 100.5 F  *CHILLS WITH OR WITHOUT FEVER  NAUSEA AND VOMITING THAT IS NOT CONTROLLED WITH YOUR NAUSEA MEDICATION  *UNUSUAL SHORTNESS OF BREATH  *UNUSUAL BRUISING OR BLEEDING  TENDERNESS IN MOUTH AND THROAT WITH OR WITHOUT PRESENCE OF ULCERS  *URINARY PROBLEMS  *BOWEL PROBLEMS  UNUSUAL RASH Items with * indicate a potential emergency and should be followed up as soon as possible.  Feel free to call the clinic you have any questions or concerns. The clinic phone number is [phone].    

## 2012-11-10 ENCOUNTER — Telehealth: Payer: Self-pay | Admitting: Oncology

## 2012-11-10 ENCOUNTER — Ambulatory Visit: Payer: Medicare Other | Attending: General Surgery | Admitting: Physical Therapy

## 2012-11-11 ENCOUNTER — Telehealth: Payer: Self-pay | Admitting: *Deleted

## 2012-11-11 NOTE — Telephone Encounter (Signed)
Lm with the pt at 2:14pm. Informed her that the [facility] that Amy had referred her to denied her for a ov, due to they do not take her insurance...td

## 2012-11-12 ENCOUNTER — Telehealth: Payer: Self-pay | Admitting: *Deleted

## 2012-11-12 NOTE — Telephone Encounter (Signed)
Late entry: Called and left patient message on 11/10/12 that it was very important that she give [place] a call back to reschedule her MRI brain and that we are very concerned that she cancelled her MRI brain because of the symptoms she was having.  Will await a call back from patient.

## 2012-11-15 ENCOUNTER — Encounter (HOSPITAL_COMMUNITY): Payer: Self-pay | Admitting: *Deleted

## 2012-11-15 ENCOUNTER — Encounter (HOSPITAL_COMMUNITY): Payer: Self-pay | Admitting: Cardiology

## 2012-11-16 ENCOUNTER — Ambulatory Visit (HOSPITAL_COMMUNITY)
Admission: RE | Admit: 2012-11-16 | Discharge: 2012-11-16 | Disposition: A | Discharge status: Home / Self Care | Payer: Medicare Other | Source: Ambulatory Visit | Attending: Oncology | Admitting: Oncology

## 2012-11-16 ENCOUNTER — Encounter (HOSPITAL_COMMUNITY): Payer: Self-pay

## 2012-11-16 ENCOUNTER — Ambulatory Visit (HOSPITAL_BASED_OUTPATIENT_CLINIC_OR_DEPARTMENT_OTHER)
Admission: RE | Admit: 2012-11-16 | Discharge: 2012-11-16 | Disposition: A | Discharge status: Home / Self Care | Payer: Medicare Other | Source: Ambulatory Visit | Attending: Internal Medicine | Admitting: Internal Medicine

## 2012-11-16 VITALS — BP 98/70 | HR 77 | Wt 207.8 lb

## 2012-11-16 DIAGNOSIS — Z79899 Other long term (current) drug therapy: Secondary | ICD-10-CM | POA: Insufficient documentation

## 2012-11-16 DIAGNOSIS — C50511 Malignant neoplasm of lower-outer quadrant of right female breast: Secondary | ICD-10-CM

## 2012-11-16 DIAGNOSIS — C50919 Malignant neoplasm of unspecified site of unspecified female breast: Secondary | ICD-10-CM | POA: Insufficient documentation

## 2012-11-16 DIAGNOSIS — Z170 Estrogen receptor positive status [ER+]: Secondary | ICD-10-CM | POA: Insufficient documentation

## 2012-11-16 DIAGNOSIS — Z09 Encounter for follow-up examination after completed treatment for conditions other than malignant neoplasm: Secondary | ICD-10-CM

## 2012-11-16 DIAGNOSIS — Z9010 Acquired absence of unspecified breast and nipple: Secondary | ICD-10-CM | POA: Insufficient documentation

## 2012-11-16 DIAGNOSIS — H9190 Unspecified hearing loss, unspecified ear: Secondary | ICD-10-CM | POA: Insufficient documentation

## 2012-11-16 DIAGNOSIS — Z8541 Personal history of malignant neoplasm of cervix uteri: Secondary | ICD-10-CM | POA: Insufficient documentation

## 2012-11-16 DIAGNOSIS — C50519 Malignant neoplasm of lower-outer quadrant of unspecified female breast: Secondary | ICD-10-CM

## 2012-11-16 DIAGNOSIS — M329 Systemic lupus erythematosus, unspecified: Secondary | ICD-10-CM | POA: Insufficient documentation

## 2012-11-16 DIAGNOSIS — IMO0001 Reserved for inherently not codable concepts without codable children: Secondary | ICD-10-CM | POA: Insufficient documentation

## 2012-11-16 DIAGNOSIS — G709 Myoneural disorder, unspecified: Secondary | ICD-10-CM | POA: Insufficient documentation

## 2012-11-16 DIAGNOSIS — M2550 Pain in unspecified joint: Secondary | ICD-10-CM | POA: Insufficient documentation

## 2012-11-16 DIAGNOSIS — G43909 Migraine, unspecified, not intractable, without status migrainosus: Secondary | ICD-10-CM | POA: Insufficient documentation

## 2012-11-16 NOTE — Progress Notes (Signed)
General Surgeon: Dr Dwain Sarna PCP: None Oncologist: Dr Darnelle Catalan  HPI: Holly Parrish is a 42 y.o. 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 Status post: (a) 3 cycles of Taxotere/carbo with Herceptin with poor tolerance,  one cycle of weekly Carbo/Taxol and Herceptin in early November 2013, with poor tolerance, and 2 doses of weekly Carbo/Gemzar with Herceptin with poor tolerance and worsening neuropathy. Last dose of chemo given in early December 2013. Plans to continue Herceptin until August 2014. S/p right mastectomy 10/06/12   Echos:  03/12/12 EF 55-60%   08/17/12 EF 55% lateral S' 8.7.poor window  11/16/12 EF lat s ' 9.8 poor window  She returns for 3 month follow up today.  She continues to receive herceptin every 3 weeks.  She denies orthopnea, PND or dyspnea.  No chest pain.  Has neuropathy and has been referred to Dr. Manon Hilding for further pain management.  She has recently experienced increased headaches, dizziness and visual changes and has been scheduled for brain MRI.  Compliant with medications.  Only taking lasix prn.   Review of Systems: All pertinent positives and negatives as in HPI, otherwise negative.    Past Medical History  Diagnosis Date  . Lupus   . Joint pain   . Hearing loss   . Bilateral swelling of feet   . Breast cancer 40  . Cancer 37    Cervical, treated with cryoablation  . Asthma   . Neuromuscular disorder   . Migraines   . Headache   . Arthritis     Current Outpatient Prescriptions  Medication Sig Dispense Refill  . furosemide (LASIX) 20 MG tablet Take 1 tablet (20 mg total) by mouth 2 (two) times daily.  30 tablet  0  . gabapentin (NEURONTIN) 300 MG capsule Take 3 capsules (900 mg total) by mouth 4 (four) times daily.  90 capsule  1  .  lidocaine-prilocaine (EMLA) cream Apply topically as needed.  30 g  0  . ondansetron (ZOFRAN) 8 MG tablet Take 1 tablet (8 mg total) by mouth every 12 (twelve) hours as needed for nausea.  20 tablet  0  . Oxycodone HCl 10 MG TABS Take 1 tablet (10 mg total) by mouth every 4 (four) hours as needed.  60 tablet  0  . amitriptyline (ELAVIL) 50 MG tablet Take 1 tablet (50 mg total) by mouth at bedtime.  20 tablet  0  . B Complex Vitamins (VITAMIN B COMPLEX PO) Take 1 tablet by mouth daily.       Marland Kitchen docusate sodium (COLACE) 100 MG capsule Take 200 mg by mouth every morning.      . DULoxetine (CYMBALTA) 20 MG capsule Take 1 capsule (20 mg total) by mouth at bedtime.  30 capsule  0  . EPINEPHrine (EPIPEN 2-PAK) 0.3 mg/0.3 mL DEVI Inject 0.3 mg into the muscle once. Bee venom      . PRESCRIPTION MEDICATION Inject into the vein every 30 (thirty) days. trastuzumab (HERCEPTIN) 546 mg in sodium chloride 0.9 % 250 mL chemo infusion   6 mg/kg, 552 mL/hr      . promethazine (PHENERGAN) 25 MG tablet Take 1 tablet by mouth every 6 to 8 hrs as needed for nausea or vomiting  30 tablet  0   No current facility-administered medications for  this encounter.     Allergies  Allergen Reactions  . Bee Venom Anaphylaxis  . Nsaids Anaphylaxis  . Penicillins Anaphylaxis  . Tetanus Toxoids Anaphylaxis  . Vitamin K And Related Anaphylaxis  . Hydrogen Peroxide Other (See Comments)    "It burns until I bleed."  . Banana Other (See Comments)    Face swells     PHYSICAL EXAM: Filed Vitals:   11/16/12 0938  BP: 98/70  Pulse: 77  Weight: 207 lb 12.8 oz (94.257 kg)  SpO2: 98%   General:  Chronically ill appearing. No respiratory difficulty HEENT: normal Neck: supple. no JVD. Carotids 2+ bilat; no bruits. No lymphadenopathy or thryomegaly appreciated. Cor: PMI nondisplaced. Regular rate & rhythm. No rubs, gallops or murmurs. Lungs: clear Abdomen: soft, nontender, nondistended. No hepatosplenomegaly. No bruits or  masses. Good bowel sounds. Extremities: no cyanosis, clubbing, rash, edema Neuro: Alert & oriented x 3, cranial nerves grossly intact. moves all 4 extremities w/o difficulty. Affect pleasant.

## 2012-11-16 NOTE — Progress Notes (Signed)
  Echocardiogram 2D Echocardiogram has been performed.  Ellender Hose A 11/16/2012, 10:27 AM

## 2012-11-16 NOTE — Assessment & Plan Note (Signed)
Have reviewed today's echo, EF and lat s' remain stable.  She is ok to continue herceptin therapy.  Will have her follow up in 3 months with echo.  Will continue lasix prn.  I have also given her a prescription for compression hose as her legs swell throughout at the day.  She will call if she experiences HF symptoms prior to follow up.

## 2012-11-17 ENCOUNTER — Telehealth: Payer: Self-pay | Admitting: *Deleted

## 2012-11-17 NOTE — Telephone Encounter (Signed)
Received call from Tiffany with scheduling stating that patient called and wanted to reschedule her MRI brain but wants medication for claustrophobia.  Attempted to call patient.  Left message stating we would call in something for her but she needed to call our office to verify her pharmacy and give her instructions.

## 2012-11-19 ENCOUNTER — Encounter: Payer: Self-pay | Admitting: Oncology

## 2012-11-19 NOTE — Progress Notes (Signed)
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  Past/Anticipated interventions by surgeon, if any: radical modified mastectomy  Past/Anticipated interventions by medical oncology, if any: 3 cycles of Taxotere/carbo with Herceptin, 1 cycle carbo/taxol and herceptin in November 2013, 2 doses weekly Carbo/Gemzar with Herceptin in Drecember 2013, Herceptin to be continued for a total of one year  She received Herceptin once every 3 weeks.  Lymphedema issues, if any:  Swelling and numbness in right arm/shoulder  Pain issues, if any:  Referral to Dr. Manon Hilding - will see him at the end of this month.  SAFETY ISSUES:  Prior radiation?  no  Pacemaker/ICD?  no  Possible current pregnancy?  no  Is the patient on methotrexate?  no  Current Complaints / other details:   Holly Parrish here with her daughter for a consult for right breast cancer.  She is having pain that she is rating at a 7/10 with activity in her feet.  She has peripheral neuropathy from chemotherapy.  She is reporting some swelling in her right arm/hand.

## 2012-11-20 ENCOUNTER — Other Ambulatory Visit (HOSPITAL_COMMUNITY): Payer: Medicare Other

## 2012-11-20 ENCOUNTER — Ambulatory Visit (HOSPITAL_COMMUNITY): Admission: RE | Admit: 2012-11-20 | Payer: Medicare Other | Source: Ambulatory Visit

## 2012-11-22 ENCOUNTER — Other Ambulatory Visit: Payer: Self-pay | Admitting: *Deleted

## 2012-11-22 ENCOUNTER — Ambulatory Visit (HOSPITAL_COMMUNITY): Payer: Medicare Other

## 2012-11-23 ENCOUNTER — Ambulatory Visit: Admission: RE | Admit: 2012-11-23 | Payer: Medicare Other | Source: Ambulatory Visit | Admitting: Radiation Oncology

## 2012-11-23 ENCOUNTER — Encounter: Payer: Self-pay | Admitting: *Deleted

## 2012-11-23 ENCOUNTER — Telehealth: Payer: Self-pay | Admitting: *Deleted

## 2012-11-23 ENCOUNTER — Ambulatory Visit: Admission: RE | Admit: 2012-11-23 | Payer: Medicare Other | Source: Ambulatory Visit

## 2012-11-23 ENCOUNTER — Telehealth: Payer: Self-pay | Admitting: Oncology

## 2012-11-23 NOTE — Telephone Encounter (Signed)
Called and left a message for [facility] regarding her missed consult appointment for today at 9:30.  I asked her to call the office to reschedule.

## 2012-11-23 NOTE — Telephone Encounter (Signed)
This RN received message late afternoon 4/28 from pt stating " I am still waiting for my prescription for Ativan to get the MRI " Return call number left [phone].  This RN returned call to pt and discussed concern per previous " reaction " to ativan- Holly Parrish states she does not recall reaction to ativan - she has had reactions to cymbalta, zoloft and valium . This RN reiterated ativan is also known as lorazepam to verify name of medication for reaction history. Holly Parrish then stated " I have lorazepam in the home- I didn't know they were the same medication "- she again denied reaction to lorazepam. This RN discussed use of ativan for MRI procedure and informed pt to call MRI now to be rescheduled ASAP. Holly Parrish verbalized understanding.

## 2012-11-30 ENCOUNTER — Ambulatory Visit: Payer: Medicare Other

## 2012-11-30 ENCOUNTER — Other Ambulatory Visit: Payer: Medicare Other | Admitting: Lab

## 2012-12-01 ENCOUNTER — Other Ambulatory Visit: Payer: Self-pay | Admitting: *Deleted

## 2012-12-01 DIAGNOSIS — C50919 Malignant neoplasm of unspecified site of unspecified female breast: Secondary | ICD-10-CM

## 2012-12-01 MED ORDER — ALPRAZOLAM 0.5 MG PO TABS: ORAL_TABLET | ORAL | Status: DC

## 2012-12-01 NOTE — Telephone Encounter (Signed)
After receiving call in triage from patient, discussion was had with desk nurse Deanna Artis and Dr Darnelle Catalan, regarding patients concerns. Appts have been made for patient to come in for further discussion tomorrow. Patient very tearful, emotional and disturbed about current situation that "we have put her in". Patient states she does not feel anyone here helps her do anything. Again, encouraged patient to write down her concerns so we can deal with them at appt tomorrow. Patient verbalized understanding.
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
## 2012-12-02 ENCOUNTER — Telehealth: Payer: Self-pay | Admitting: Oncology

## 2012-12-02 ENCOUNTER — Ambulatory Visit (HOSPITAL_BASED_OUTPATIENT_CLINIC_OR_DEPARTMENT_OTHER): Payer: Medicare Other

## 2012-12-02 ENCOUNTER — Encounter: Payer: Self-pay | Admitting: *Deleted

## 2012-12-02 ENCOUNTER — Other Ambulatory Visit: Payer: Self-pay | Admitting: *Deleted

## 2012-12-02 ENCOUNTER — Other Ambulatory Visit (HOSPITAL_BASED_OUTPATIENT_CLINIC_OR_DEPARTMENT_OTHER): Payer: Medicare Other | Admitting: Lab

## 2012-12-02 ENCOUNTER — Ambulatory Visit (HOSPITAL_BASED_OUTPATIENT_CLINIC_OR_DEPARTMENT_OTHER): Payer: Medicare Other | Admitting: Oncology

## 2012-12-02 ENCOUNTER — Telehealth: Payer: Self-pay | Admitting: *Deleted

## 2012-12-02 VITALS — BP 118/81 | HR 75 | Temp 98.9°F | Resp 20 | Ht 62.0 in | Wt 203.6 lb

## 2012-12-02 DIAGNOSIS — C50511 Malignant neoplasm of lower-outer quadrant of right female breast: Secondary | ICD-10-CM

## 2012-12-02 DIAGNOSIS — C50519 Malignant neoplasm of lower-outer quadrant of unspecified female breast: Secondary | ICD-10-CM

## 2012-12-02 DIAGNOSIS — G609 Hereditary and idiopathic neuropathy, unspecified: Secondary | ICD-10-CM

## 2012-12-02 DIAGNOSIS — Z170 Estrogen receptor positive status [ER+]: Secondary | ICD-10-CM

## 2012-12-02 DIAGNOSIS — Z5112 Encounter for antineoplastic immunotherapy: Secondary | ICD-10-CM

## 2012-12-02 DIAGNOSIS — C50919 Malignant neoplasm of unspecified site of unspecified female breast: Secondary | ICD-10-CM

## 2012-12-02 LAB — CBC WITH DIFFERENTIAL/PLATELET
Eosinophils Absolute: 0.1 10*3/uL (ref 0.0–0.5)
LYMPH%: 46 % (ref 14.0–49.7)
MONO#: 0.6 10*3/uL (ref 0.1–0.9)
NEUT#: 3.1 10*3/uL (ref 1.5–6.5)
Platelets: 442 10*3/uL — ABNORMAL HIGH (ref 145–400)
RBC: 3.99 10*6/uL (ref 3.70–5.45)
WBC: 7.1 10*3/uL (ref 3.9–10.3)
lymph#: 3.3 10*3/uL (ref 0.9–3.3)
nRBC: 0 % (ref 0–0)

## 2012-12-02 LAB — COMPREHENSIVE METABOLIC PANEL (CC13)
ALT: 13 U/L (ref 0–55)
AST: 17 U/L (ref 5–34)
Albumin: 3.2 g/dL — ABNORMAL LOW (ref 3.5–5.0)
CO2: 24 mEq/L (ref 22–29)
Calcium: 8.7 mg/dL (ref 8.4–10.4)
Chloride: 108 mEq/L — ABNORMAL HIGH (ref 98–107)
Creatinine: 0.9 mg/dL (ref 0.6–1.1)
Potassium: 3.6 mEq/L (ref 3.5–5.1)
Sodium: 141 mEq/L (ref 136–145)
Total Protein: 6.7 g/dL (ref 6.4–8.3)

## 2012-12-02 MED ORDER — GABAPENTIN 300 MG PO CAPS: 900.0000 mg | ORAL_CAPSULE | Freq: Four times a day (QID) | ORAL | Status: DC

## 2012-12-02 MED ORDER — DIPHENHYDRAMINE HCL 25 MG PO CAPS
50.0000 mg | ORAL_CAPSULE | Freq: Once | ORAL | Status: AC
  Administered 2012-12-02: 50 mg via ORAL

## 2012-12-02 MED ORDER — HEPARIN SOD (PORK) LOCK FLUSH 100 UNIT/ML IV SOLN
500.0000 [IU] | Freq: Once | INTRAVENOUS | Status: AC | PRN
  Administered 2012-12-02: 500 [IU]
  Filled 2012-12-02: qty 5

## 2012-12-02 MED ORDER — SODIUM CHLORIDE 0.9 % IV SOLN
6.0000 mg/kg | Freq: Once | INTRAVENOUS | Status: AC
  Administered 2012-12-02: 546 mg via INTRAVENOUS
  Filled 2012-12-02: qty 26

## 2012-12-02 MED ORDER — SODIUM CHLORIDE 0.9 % IV SOLN
Freq: Once | INTRAVENOUS | Status: AC
  Administered 2012-12-02: 14:00:00 via INTRAVENOUS

## 2012-12-02 MED ORDER — SODIUM CHLORIDE 0.9 % IJ SOLN
10.0000 mL | INTRAMUSCULAR | Status: DC | PRN
  Administered 2012-12-02: 10 mL
  Filled 2012-12-02: qty 10

## 2012-12-02 MED ORDER — ACETAMINOPHEN 325 MG PO TABS
650.0000 mg | ORAL_TABLET | Freq: Once | ORAL | Status: AC
  Administered 2012-12-02: 650 mg via ORAL

## 2012-12-02 NOTE — Patient Instructions (Addendum)
[facility] Discharge Instructions for Patients Receiving Chemotherapy  Today you received the following chemotherapy agents: herceptin  To help prevent nausea and vomiting after your treatment, we encourage you to take your nausea medication.  Take it as often as prescribed.     If you develop nausea and vomiting that is not controlled by your nausea medication, call the clinic. If it is after clinic hours your family physician or the after hours number for the clinic or go to the Emergency Department.   BELOW ARE SYMPTOMS THAT SHOULD BE REPORTED IMMEDIATELY:  *FEVER GREATER THAN 100.5 F  *CHILLS WITH OR WITHOUT FEVER  NAUSEA AND VOMITING THAT IS NOT CONTROLLED WITH YOUR NAUSEA MEDICATION  *UNUSUAL SHORTNESS OF BREATH  *UNUSUAL BRUISING OR BLEEDING  TENDERNESS IN MOUTH AND THROAT WITH OR WITHOUT PRESENCE OF ULCERS  *URINARY PROBLEMS  *BOWEL PROBLEMS  UNUSUAL RASH Items with * indicate a potential emergency and should be followed up as soon as possible.  One of the nurses will contact you 24 hours after your treatment. Please let the nurse know about any problems that you may have experienced. Feel free to call the clinic you have any questions or concerns. The clinic phone number is [phone].   I have been informed and understand all the instructions given to me. I know to contact the clinic, my physician, or go to the Emergency Department if any problems should occur. I do not have any questions at this time, but understand that I may call the clinic during office hours   should I have any questions or need assistance in obtaining follow up care.    __________________________________________  _____________  __________ Signature of Patient or Authorized Representative            Date                   Time    __________________________________________ Nurse's Signature   
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
## 2012-12-02 NOTE — Progress Notes (Signed)
Clinical Social Worker received referral from RN and MD.  CSW met with pt in exam room to assess for needs and concerns.  Pt was visibly overwhelmed, upset, and tearful.  Pt stated she was in pain and felt that "no one was listening to her".  CSW deescalated patient so pt was able to verbalize her main concerns.  Pt stated she was in constant pain due to neuropathy; which was causing her to be unable to work.  Pt verbalized understanding that physician could no longer prescribe her pain medications until further evaluation was completed.  A referral has been made to a pain clinic, clinic for her neuropathy, and ct has been ordered for further testing.  Pt verbalized understanding.  Pt expressed other issues in her life that were causing her stress.  CSw and pt processed those feelings.  CSw and pt discussed appropriate coping skill when she beings to feel overwhelmed.  CSW encouraged pt to call with any other needs or concerns.  Tamala Julian, MSW, LCSW Clinical Social Worker [facility] [phone]

## 2012-12-02 NOTE — Telephone Encounter (Signed)
This RN called to Dr Lowella Dandy office at [facility] office to schedule an appt.  Was transferred to [facility] for [facility]. Detailed message left per request for appointment for neuropathic pain.

## 2012-12-03 LAB — URINE DRUGS OF ABUSE SCREEN W ALC, ROUTINE (REF LAB)
Barbiturate Quant, Ur: NEGATIVE
Benzodiazepines.: NEGATIVE
Creatinine,U: 191.6 mg/dL
Methadone: NEGATIVE
Propoxyphene: NEGATIVE

## 2012-12-07 ENCOUNTER — Other Ambulatory Visit: Payer: Self-pay | Admitting: *Deleted

## 2012-12-07 ENCOUNTER — Telehealth: Payer: Self-pay | Admitting: *Deleted

## 2012-12-07 ENCOUNTER — Telehealth: Payer: Self-pay | Admitting: Oncology

## 2012-12-07 NOTE — Telephone Encounter (Signed)
PT. AWOKE THIS MORNING "IN A LARGE POOL OF BLOOD". THE PAST TWO HOURS SHE HAS USE A TAMPON AND TWO SUPER SIZE PADS. PT. HAS NOT HAD A MENSTRUAL PERIOD SINCE AUGUST 2013. SPOKE TO DR.MAGRINAT'S NURSE, VAL DODD,RN. INFORMED PT. THIS IS NORMAL AND TO SEE HER GYN. PT. DOES NOT HAVE A GYN OR A PRIMARY CARE PHYSICIAN.SHE ONLY HAS MEDICAID FOR HER HEALTH CARE. INSTRUCTED PT. TO MONITOR HER VAGINAL BLEEDING AND CALL IF THIS SITUATION WORSENS. VAL WILL CONTACT PT. CONCERNING THE PHYSICIAN MATTER. [name] UNDERSTANDING

## 2012-12-07 NOTE — Telephone Encounter (Signed)
i have been trying to get the pt an appt with the pain management clinic for a month now. I completed all the requirements that Tresa Endo the coordinator requested. The last i actually spoke with her she stated that the pts files are in the [facility]. I have tried to contact her several times and left messages; however, the pt still does not have an appt with them....td

## 2012-12-07 NOTE — Telephone Encounter (Signed)
Faxed pt medical records to Dr Cherly Hensen

## 2012-12-07 NOTE — Telephone Encounter (Signed)
Gave fax info to Selena Batten so that she could fax the pt records to Dr. Cherly Hensen GYN and they will call her w/ an appt...td

## 2012-12-15 ENCOUNTER — Ambulatory Visit
Admission: RE | Admit: 2012-12-15 | Discharge: 2012-12-15 | Disposition: A | Discharge status: Home / Self Care | Payer: Medicare Other | Source: Ambulatory Visit | Attending: Radiation Oncology | Admitting: Radiation Oncology

## 2012-12-15 ENCOUNTER — Encounter: Payer: Self-pay | Admitting: Radiation Oncology

## 2012-12-15 ENCOUNTER — Ambulatory Visit: Admission: RE | Admit: 2012-12-15 | Payer: Medicare Other | Source: Ambulatory Visit

## 2012-12-15 HISTORY — DX: Allergy, unspecified, initial encounter: T78.40XA

## 2012-12-21 ENCOUNTER — Telehealth: Payer: Self-pay | Admitting: Oncology

## 2012-12-21 ENCOUNTER — Encounter: Payer: Self-pay | Admitting: Oncology

## 2012-12-21 ENCOUNTER — Ambulatory Visit (HOSPITAL_BASED_OUTPATIENT_CLINIC_OR_DEPARTMENT_OTHER): Payer: Medicare Other | Admitting: Oncology

## 2012-12-21 ENCOUNTER — Other Ambulatory Visit: Payer: Self-pay | Admitting: *Deleted

## 2012-12-21 ENCOUNTER — Ambulatory Visit (HOSPITAL_BASED_OUTPATIENT_CLINIC_OR_DEPARTMENT_OTHER): Payer: Medicare Other

## 2012-12-21 ENCOUNTER — Ambulatory Visit: Payer: Medicare Other

## 2012-12-21 ENCOUNTER — Other Ambulatory Visit (HOSPITAL_BASED_OUTPATIENT_CLINIC_OR_DEPARTMENT_OTHER): Payer: Medicare Other | Admitting: Lab

## 2012-12-21 VITALS — BP 122/82 | HR 78 | Temp 98.1°F | Resp 20 | Ht 62.0 in | Wt 200.7 lb

## 2012-12-21 DIAGNOSIS — I890 Lymphedema, not elsewhere classified: Secondary | ICD-10-CM

## 2012-12-21 DIAGNOSIS — C50519 Malignant neoplasm of lower-outer quadrant of unspecified female breast: Secondary | ICD-10-CM

## 2012-12-21 DIAGNOSIS — F411 Generalized anxiety disorder: Secondary | ICD-10-CM

## 2012-12-21 DIAGNOSIS — G609 Hereditary and idiopathic neuropathy, unspecified: Secondary | ICD-10-CM

## 2012-12-21 DIAGNOSIS — M792 Neuralgia and neuritis, unspecified: Secondary | ICD-10-CM

## 2012-12-21 DIAGNOSIS — Z5112 Encounter for antineoplastic immunotherapy: Secondary | ICD-10-CM

## 2012-12-21 DIAGNOSIS — C50511 Malignant neoplasm of lower-outer quadrant of right female breast: Secondary | ICD-10-CM

## 2012-12-21 LAB — COMPREHENSIVE METABOLIC PANEL
ALT: 36 U/L — ABNORMAL HIGH (ref 0–35)
Albumin: 3.5 g/dL (ref 3.5–5.2)
Alkaline Phosphatase: 137 U/L — ABNORMAL HIGH (ref 39–117)
CO2: 27 mEq/L (ref 19–32)
Glucose, Bld: 106 mg/dL — ABNORMAL HIGH (ref 70–99)
Potassium: 3.8 mEq/L (ref 3.5–5.3)
Sodium: 143 mEq/L (ref 135–145)
Total Bilirubin: 0.2 mg/dL — ABNORMAL LOW (ref 0.3–1.2)
Total Protein: 7.4 g/dL (ref 6.0–8.3)

## 2012-12-21 LAB — CBC WITH DIFFERENTIAL/PLATELET
BASO%: 0.7 % (ref 0.0–2.0)
Eosinophils Absolute: 0.1 10*3/uL (ref 0.0–0.5)
LYMPH%: 55.3 % — ABNORMAL HIGH (ref 14.0–49.7)
MCHC: 33 g/dL (ref 31.5–36.0)
MONO#: 0.5 10*3/uL (ref 0.1–0.9)
MONO%: 6.2 % (ref 0.0–14.0)
NEUT#: 2.6 10*3/uL (ref 1.5–6.5)
RBC: 4.2 10*6/uL (ref 3.70–5.45)
RDW: 12.9 % (ref 11.2–14.5)
WBC: 7.3 10*3/uL (ref 3.9–10.3)

## 2012-12-21 MED ORDER — SODIUM CHLORIDE 0.9 % IJ SOLN
10.0000 mL | INTRAMUSCULAR | Status: DC | PRN
  Administered 2012-12-21: 10 mL
  Filled 2012-12-21: qty 10

## 2012-12-21 MED ORDER — ACETAMINOPHEN 325 MG PO TABS
650.0000 mg | ORAL_TABLET | Freq: Once | ORAL | Status: AC
  Administered 2012-12-21: 650 mg via ORAL

## 2012-12-21 MED ORDER — SODIUM CHLORIDE 0.9 % IV SOLN
Freq: Once | INTRAVENOUS | Status: AC
  Administered 2012-12-21: 17:00:00 via INTRAVENOUS

## 2012-12-21 MED ORDER — DIPHENHYDRAMINE HCL 25 MG PO CAPS
50.0000 mg | ORAL_CAPSULE | Freq: Once | ORAL | Status: AC
  Administered 2012-12-21: 25 mg via ORAL

## 2012-12-21 MED ORDER — HEPARIN SOD (PORK) LOCK FLUSH 100 UNIT/ML IV SOLN
500.0000 [IU] | Freq: Once | INTRAVENOUS | Status: AC | PRN
  Administered 2012-12-21: 500 [IU]
  Filled 2012-12-21: qty 5

## 2012-12-21 MED ORDER — TRASTUZUMAB CHEMO INJECTION 440 MG
6.0000 mg/kg | Freq: Once | INTRAVENOUS | Status: AC
  Administered 2012-12-21: 546 mg via INTRAVENOUS
  Filled 2012-12-21: qty 26

## 2012-12-21 MED ORDER — GABAPENTIN 600 MG PO TABS: 1200.0000 mg | ORAL_TABLET | Freq: Three times a day (TID) | ORAL | Status: DC

## 2012-12-21 NOTE — Progress Notes (Signed)
ID: Holly Parrish   DOB: [birth year]  MR#: [id number]  JXB#:[id number]  PCP: No PCP Per Patient GYN:  SU: Emelia Loron OTHER MD:   HISTORY OF PRESENT ILLNESS: Holly Parrish noted a mass in her right breast early in 2013. She did not have any healthcare insurance and so delayed getting this looked after. She ultimately underwent a mammogram on 02/16/2012. This showed a mass in the right breast 8:00 position measuring 4 cm; it was firm and palpable. There was also a l 3 cm lower right axillary lymph node. Ultrasound confirmed the presence of the mass in the breast measuring 2.7 x 2.7 x 2.9 cm. There were 2 large right lower right axillary lymph nodes largest one measuring 4 cm a smaller one measuring 3.1 cm. She underwent biopsies of both these areas. 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 The tumor was also estrogen and progesterone receptor positive.  STAGE:  Cancer of lower-outer quadrant of Parrish breast, right Primary site: Breast (Right)  Staging method: AJCC 7th Edition  Clinical: Stage IIB (T2, N1, cM0)  Summary: Stage IIB (T2, N1, cM0)  Remainder history as noted below.  INTERVAL HISTORY: Holly Parrish  returns today for followup of her right breast cancer. She underwent right mastectomy on 10/06/2012 and continues to receive trastuzumab every 3 weeks, the next of which is due today. She is ready to initiate radiation therapy in the next couple of weeks, under the care of Dr. Dayton Scrape.  Holly Parrish's biggest complaint continues to be severe pain in the extremities associated with peripheral neuropathy. This is affecting her feet and lower legs as well as her hands bilaterally, although the right side seems to be worse than the left. This limits all of her day-to-day activities. It makes it very difficult for her to walk, and very difficult to use her hands for fine motor skills. She's also having some lymphedema in the right upper extremity, especially  affecting the right axilla, and this has also affected her range of motion.  Holly Parrish is no longer on Percocet. She does not tolerate tramadol due to nausea. She has continued on Neurontin, and has been taking 1200 mg 3 times daily with some relief. She's in the process of being scheduled to meet with a physician at the pain clinic.  Holly Parrish is still very tearful on presentation, and appears quite anxious. 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 She admits that she is anxious and "angry" but denies any actual depression and denies suicidal ideation upon questioning.   REVIEW OF SYSTEMS: Holly Parrish has had no recent illnesses and denies any fevers or chills. She's had no signs of bruising or abnormal bleeding. She denies any nausea or change in bowel or bladder habits. She's had no increased cough, shortness of breath, chest pain, palpitations. No abnormal headaches.  A detailed review of systems is otherwise stable and noncontributory.   PAST MEDICAL HISTORY: Past Medical History  Diagnosis Date  . Lupus   . Joint pain   . Hearing loss   . Bilateral swelling of feet   . Breast cancer 40  . Cancer 37    Cervical, treated with cryoablation  . Asthma   . Neuromuscular disorder   . Migraines   . Headache   . Arthritis   . Allergy     PAST SURGICAL HISTORY: Past Surgical History  Procedure Laterality Date  . Tonsillectomy    .  Cholecystectomy    . 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 Portacath placement  03/11/2012    Procedure: INSERTION PORT-A-CATH;  Surgeon: Emelia Loron, MD;  Location: [facility];  Service: General;  Laterality: Left;  . Hand surgery  1997  . Appendectomy  1995  . Abdominal exploration surgery  [id number]  . Tubal ligation  1997  . Mastectomy modified radical Right 10/06/2012    Procedure: MASTECTOMY MODIFIED RADICAL WITH AXILLARY CONTENT;  Surgeon: Emelia Loron, MD;   Location: [facility];  Service: General;  Laterality: Right;    FAMILY HISTORY Family History  Problem Relation Age of Onset  . Uterine cancer Mother 86  . Pancreatic cancer Paternal Uncle     diagnosed in his 72s; smoker; half uncle related through grandmother  . Cancer Paternal Uncle     Throat  . Liver cancer Maternal Grandmother     not a drinker  . Cancer Maternal Grandmother   . Breast cancer Cousin     3 paternal cousins with breast cancer, onset 25, 44 and one bilateral at  48 and 8  . Cancer Cousin     Breast  . Heart attack Father 80  . Breast cancer Paternal Grandmother 77  . Heart attack Paternal Grandfather   . Throat cancer Paternal Aunt     smoker; half uncle related through grandmother  . Breast cancer Paternal Aunt     2 paternal aunts, both with 2 diagnoses of breast cancer each  . Cystic fibrosis Cousin   . Cancer Cousin     Breast  . Cancer Cousin     Breast  . Breast cancer Paternal Aunt     GYNECOLOGIC HISTORY: G5 P5,  one daughter died at age 44; menarche at age 1; age of first live birth is 43; no recent history of birth control pill use; has normal menses.  SOCIAL HISTORY: From [place] where most of her family still resides.  Worked as a Fish farm manager.  She has been married twice and recently has been divorced.  She has 3 children who live in [place], one daughter who moved to this area.    ADVANCED DIRECTIVES:  HEALTH MAINTENANCE: History  Substance Use Topics  . Smoking status: Never Smoker   . Smokeless tobacco: Never Used  . Alcohol Use: Yes     Comment: occasional drinker     Colonoscopy:  PAP:  Bone density:  Lipid panel:  Allergies  Allergen Reactions  . Bee Venom Anaphylaxis  . Nsaids Anaphylaxis  . Penicillins Anaphylaxis  . Tetanus Toxoids Anaphylaxis  . Vitamin K And Related Anaphylaxis  . Hydrogen Peroxide Other (See Comments)    "It burns until I bleed."  . Banana Other (See Comments)    Face swells     Current Outpatient Prescriptions  Medication Sig Dispense Refill  . B Complex Vitamins (VITAMIN B COMPLEX PO) Take 1 tablet by mouth daily.       Marland Kitchen EPINEPHrine (EPIPEN 2-PAK) 0.3 mg/0.3 mL DEVI Inject 0.3 mg into the muscle once. Bee venom      . lidocaine-prilocaine (EMLA) cream Apply topically as needed.  30 g  0  . PRESCRIPTION MEDICATION Inject into the vein every 30 (thirty) days. trastuzumab (HERCEPTIN) 546 mg in sodium chloride 0.9 % 250 mL chemo infusion   6 mg/kg, 552 mL/hr      . ALPRAZolam (XANAX) 0.5 MG tablet Take 1 tablet one hour before procedure; bring 1 tablet to procedure  and take right before.  2 tablet  0  . amitriptyline (ELAVIL) 50 MG tablet Take 1 tablet (50 mg total) by mouth at bedtime.  20 tablet  0  . docusate sodium (COLACE) 100 MG capsule Take 200 mg by mouth every morning.      . DULoxetine (CYMBALTA) 20 MG capsule Take 1 capsule (20 mg total) by mouth at bedtime.  30 capsule  0  . furosemide (LASIX) 20 MG tablet Take 1 tablet (20 mg total) by mouth 2 (two) times daily.  30 tablet  0  . gabapentin (NEURONTIN) 600 MG tablet Take 2 tablets (1,200 mg total) by mouth 3 (three) times daily.  180 tablet  2  . ondansetron (ZOFRAN) 8 MG tablet Take 1 tablet (8 mg total) by mouth every 12 (twelve) hours as needed for nausea.  20 tablet  0  . Oxycodone HCl 10 MG TABS Take 10 mg by mouth every 4 (four) hours as needed (no refill, 60 tabs).      . promethazine (PHENERGAN) 25 MG tablet Take 1 tablet by mouth every 6 to 8 hrs as needed for nausea or vomiting  30 tablet  0   No current facility-administered medications for this visit.    OBJECTIVE: Holly Parrish in emotional distress, who also appears uncomfortable  Filed Vitals:   12/21/12 1514  BP: 122/82  Pulse: 78  Temp: 98.1 F (36.7 C)  Resp: 20     Body mass index is 36.7 kg/(m^2).    ECOG FS: 2 Filed Weights   12/21/12 1514  Weight: 200 lb 11.2 oz (91.037 kg)   Sclerae unicteric Lungs clear to  auscultation bilaterally, no wheezes, no rales or rhonchi Heart regular rate and rhythm Abdomen soft, nontender, with positive bowel sounds Mild lymphedema noted in the right upper extremity, especially the right axilla. No edema noted in the lower extremities. Neuro: nonfocal, well oriented, tearful affect. Breasts: Status post right mastectomy with well-healed incision and no evidence of local recurrence. Axillae negative for adenopathy bilaterally.  Port is  intact in the left upper chest wall.   LAB RESULTS: Lab Results  Component Value Date   WBC 7.3 12/21/2012   NEUTROABS 2.6 12/21/2012   HGB 13.4 12/21/2012   HCT 40.6 12/21/2012   MCV 96.7 12/21/2012   PLT 450* 12/21/2012      Chemistry      Component Value Date/Time   NA 141 12/02/2012 1130   NA 137 10/11/2012 1130   K 3.6 12/02/2012 1130   K 3.7 10/11/2012 1130   CL 108* 12/02/2012 1130   CL 103 10/11/2012 1130   CO2 24 12/02/2012 1130   CO2 26 10/11/2012 1130   BUN 21.1 12/02/2012 1130   BUN 14 10/11/2012 1130   CREATININE 0.9 12/02/2012 1130   CREATININE 0.90 10/11/2012 1130      Component Value Date/Time   CALCIUM 8.7 12/02/2012 1130   CALCIUM 8.9 10/11/2012 1130   ALKPHOS 94 12/02/2012 1130   ALKPHOS 73 05/07/2012 0950   AST 17 12/02/2012 1130   AST 18 05/07/2012 0950   ALT 13 12/02/2012 1130   ALT 23 05/07/2012 0950   BILITOT 0.41 12/02/2012 1130   BILITOT 0.4 05/07/2012 0950        STUDIES: This recent echocardiogram on 11/16/2012 showed an ejection fraction of 6065%.    ASSESSMENT: 42 y.o.   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   (2) treated in the neoadjuvant setting.  Status post:  (a)  3 cycles of Taxotere/carbo with Herceptin, with poor tolerance  (b)  One cycle of weekly Carbo/Taxol and Herceptin in early November 2013, with poor tolerance  (c)  2  doses of weekly Carbo/Gemzar with Herceptin with poor tolerance and worsening neuropathy.  Last dose of chemo given in early December 2013.  (3)  Herceptin to be continued for a total of one year (through August of 2014). Most recent echocardiogram on 11/16/2012 showed a well preserved ejection fraction of 60 - 65%.  (4)  status post right mastectomy 10/06/2012 with a residual 1.5 cm invasive ductal carcinoma, grade 2. Tumor was focally 0.1 cm from deep margins. 0 of 22 lymph nodes were involved. (ypT1c ypN0)  PLAN: Holly Parrish we'll continue to receive trastuzumab every 3 weeks, with her next dose scheduled for today. She receive trastuzumab again on June 17 and July 8, and I'll plan to see her on July 29 for followup visit. Her next echocardiogram will be due in July. In the meanwhile, she scheduled to meet with Dr. Dayton Scrape on June 11 to initiate radiation therapy.  Dr. Lowella Dandy office is ready to schedule [facility] for a pain clinic consult, and hopefully that appointment will be made for her before she leaves today. I am also referring her back to the lymphedema clinic for further evaluation of right sided lymphedema with decreased range of motion. I have refilled her gabapentin, with instructions to take 1200 mg 3 times daily which she finds somewhat helpful for the pain.  I will mention that she is unable to take tramadol due to nausea.  All this was reviewed in detail with [facility] today, and she voices understanding and agreement with our plan. She understands to call if she has any changes or problems.    Holly Parrish    12/21/2012

## 2012-12-21 NOTE — Patient Instructions (Addendum)
[facility] Discharge Instructions for Patients Receiving Chemotherapy  Today you received the following chemotherapy agents Herceptin  To help prevent nausea and vomiting after your treatment, we encourage you to take your nausea medication     If you develop nausea and vomiting that is not controlled by your nausea medication, call the clinic.   BELOW ARE SYMPTOMS THAT SHOULD BE REPORTED IMMEDIATELY:  *FEVER GREATER THAN 100.5 F  *CHILLS WITH OR WITHOUT FEVER  NAUSEA AND VOMITING THAT IS NOT CONTROLLED WITH YOUR NAUSEA MEDICATION  *UNUSUAL SHORTNESS OF BREATH  *UNUSUAL BRUISING OR BLEEDING  TENDERNESS IN MOUTH AND THROAT WITH OR WITHOUT PRESENCE OF ULCERS  *URINARY PROBLEMS  *BOWEL PROBLEMS  UNUSUAL RASH Items with * indicate a potential emergency and should be followed up as soon as possible.  Feel free to call the clinic you have any questions or concerns. The clinic phone number is [phone].    

## 2012-12-23 ENCOUNTER — Encounter: Payer: Self-pay | Admitting: Oncology

## 2012-12-24 ENCOUNTER — Encounter (INDEPENDENT_AMBULATORY_CARE_PROVIDER_SITE_OTHER): Payer: Self-pay | Admitting: General Surgery

## 2012-12-29 ENCOUNTER — Ambulatory Visit: Payer: Medicare Other | Attending: General Surgery | Admitting: Physical Therapy

## 2012-12-29 ENCOUNTER — Encounter: Payer: Self-pay | Admitting: Genetic Counselor

## 2013-01-05 ENCOUNTER — Ambulatory Visit
Admission: RE | Admit: 2013-01-05 | Discharge: 2013-01-05 | Disposition: A | Discharge status: Home / Self Care | Payer: Medicare Other | Source: Ambulatory Visit | Attending: Radiation Oncology | Admitting: Radiation Oncology

## 2013-01-05 ENCOUNTER — Encounter: Payer: Self-pay | Admitting: Radiation Oncology

## 2013-01-05 VITALS — BP 105/74 | HR 67 | Temp 98.2°F | Ht 62.0 in | Wt 202.6 lb

## 2013-01-05 DIAGNOSIS — R112 Nausea with vomiting, unspecified: Secondary | ICD-10-CM | POA: Insufficient documentation

## 2013-01-05 DIAGNOSIS — Z79899 Other long term (current) drug therapy: Secondary | ICD-10-CM | POA: Insufficient documentation

## 2013-01-05 DIAGNOSIS — Z9010 Acquired absence of unspecified breast and nipple: Secondary | ICD-10-CM | POA: Insufficient documentation

## 2013-01-05 DIAGNOSIS — R599 Enlarged lymph nodes, unspecified: Secondary | ICD-10-CM | POA: Insufficient documentation

## 2013-01-05 DIAGNOSIS — C50511 Malignant neoplasm of lower-outer quadrant of right female breast: Secondary | ICD-10-CM

## 2013-01-05 DIAGNOSIS — R059 Cough, unspecified: Secondary | ICD-10-CM | POA: Insufficient documentation

## 2013-01-05 DIAGNOSIS — G589 Mononeuropathy, unspecified: Secondary | ICD-10-CM | POA: Insufficient documentation

## 2013-01-05 DIAGNOSIS — R05 Cough: Secondary | ICD-10-CM | POA: Insufficient documentation

## 2013-01-05 DIAGNOSIS — C50919 Malignant neoplasm of unspecified site of unspecified female breast: Secondary | ICD-10-CM | POA: Insufficient documentation

## 2013-01-05 DIAGNOSIS — G4700 Insomnia, unspecified: Secondary | ICD-10-CM | POA: Insufficient documentation

## 2013-01-05 DIAGNOSIS — C50519 Malignant neoplasm of lower-outer quadrant of unspecified female breast: Secondary | ICD-10-CM | POA: Insufficient documentation

## 2013-01-05 DIAGNOSIS — L539 Erythematous condition, unspecified: Secondary | ICD-10-CM | POA: Insufficient documentation

## 2013-01-05 DIAGNOSIS — Z510 Encounter for antineoplastic radiation therapy: Secondary | ICD-10-CM | POA: Insufficient documentation

## 2013-01-05 DIAGNOSIS — R110 Nausea: Secondary | ICD-10-CM | POA: Insufficient documentation

## 2013-01-05 DIAGNOSIS — G609 Hereditary and idiopathic neuropathy, unspecified: Secondary | ICD-10-CM | POA: Insufficient documentation

## 2013-01-05 DIAGNOSIS — R079 Chest pain, unspecified: Secondary | ICD-10-CM | POA: Insufficient documentation

## 2013-01-05 DIAGNOSIS — R109 Unspecified abdominal pain: Secondary | ICD-10-CM | POA: Insufficient documentation

## 2013-01-05 HISTORY — DX: Polyneuropathy, unspecified: G62.9

## 2013-01-05 NOTE — Progress Notes (Signed)
CC: Dr. Emelia Parrish, Dr. Marikay Alar Parrish  Followup note:  Diagnosis: Clinical stage II B. (T2 N1 M0) ( pathologic stage I (ypT1c ypN0 M0) invasive ductal carcinoma the right breast.  History: Holly Parrish is a 42 year old female who is seen today for consideration of post mastectomy radiation therapy in the management of her locally advanced invasive ductal carcinoma the right breast. I first saw [facility] at [facility] on 03/03/2012.She first noted a right breast mass or proximally 5 months ago. She was without insurance and delayed her evaluation. [facility]2012 at which time she a palpable mass within the right breast at 8:00 measuring approximately 4 cm in size. In addition there is a palpable 3 cm low right axillary lymph node. Ultrasound showed a 2.7 x 2.7 x 2.9 cm mass at 8:00 within the right breast in addition to 2 enlarged lower right axillary lymph nodes. The largest lymph node measured 4 cm in greatest dimensions. The smaller and more superiorly located lymph node measured 3.1 cm. Ultrasound-guided biopsies of the right breast mass and lymph nodes were diagnostic for invasive mammary carcinoma with angiolymphatic spread seen within the right axillary lymph node. Breast MR on 03/02/2012 showed a dominant right breast mass at 8:00 with an adjacent satellite nodule versus an intramammary lymph node in continuity with a dominant mass. There appear to be bulky right axillary lymphadenopathy along with right retropectoral lymphadenopathy. Left breast was unremarkable. Her staging PET scan showed a prominent right breast mass along with extensive right axillary and subpectoral adenopathy. She will onto receive neoadjuvant chemotherapy under the direction of Dr. Donnie Coffin, and then Dr. Darnelle Catalan with 3 cycles of Taxotere/carboplatin Herceptin which was poorly tolerated. She received one cycle of weekly carbotaxol and Herceptin and this was poorly tolerated. She then received 2 doses  of weekly carbo/Gemzar Herceptin with poor tolerance and worsening neuropathy and her last chemotherapy was given in early December 2013. She has continue with her Herceptin. On 10/07/2011 she underwent a right modified radical mastectomy with residual 1.5 cm invasive ductal carcinoma which was focally within 0.1 cm of the deep margin. All 20 lymph nodes were free of metastatic disease. She continues with her Herceptin. She is waiting to go to the pain clinic later this month for management of her neuropathy.  Physical examination: Alert and oriented. Wt Readings from Last 3 Encounters:  01/05/13 202 lb 9.6 oz (91.899 kg)  12/21/12 200 lb 11.2 oz (91.037 kg)  12/02/12 203 lb 9.6 oz (92.352 kg)   Temp Readings from Last 3 Encounters:  01/05/13 98.2 F (36.8 C)   12/21/12 98.1 F (36.7 C) Oral  12/02/12 98.9 F (37.2 C)    BP Readings from Last 3 Encounters:  01/05/13 105/74  12/21/12 122/82  12/02/12 118/81   Pulse Readings from Last 3 Encounters:  01/05/13 67  12/21/12 78  12/02/12 75   Head and neck examination: Grossly unremarkable. Nodes: Without palpable cervical, supraclavicular, or axillary lymphadenopathy. Chest: Right-sided mastectomy without visible or palpable evidence for recurrent disease. Left anterior Port-A-Cath. Lungs clear. Heart regular in rhythm. Abdomen without hepatomegaly. Extremities: There is trace right upper extremity lymphedema.  Data:  Lab Results  Component Value Date   WBC 7.3 12/21/2012   HGB 13.4 12/21/2012   HCT 40.6 12/21/2012   MCV 96.7 12/21/2012   PLT 450* 12/21/2012   Impression: Locally [place] invasive ductal carcinoma of the right breast. She has multiple indications for post mastectomy radiation therapy.  She obviously had a nice response to neoadjuvant chemotherapy. However, she still had residual disease including a 1 mm margin to her chest wall. On review of her PET scan she had extensive axillary lymphadenopathy with what appears to be at  least 5-6 involved lymph nodes. We discussed the potential acute and late toxicities of post mastectomy radiation therapy, and she wishes to proceed as outlined. I emphasized the need for her to come in for daily treatments. She will undergo simulation/treatment planning today and begin her radiation therapy in approximately 1-1/2 weeks. Consent is signed today.  Plan: As discussed above.  40 minutes was spent face-to-face with the patient, primarily counseling the patient and coordinating her care.

## 2013-01-05 NOTE — Progress Notes (Signed)
Please see the Nurse Progress Note in the MD Initial Consult Encounter for this patient. 

## 2013-01-06 NOTE — Progress Notes (Signed)
Simulation/treatment planning note: The patient was taken to the CT simulator on 01/05/2013. She was placed on a custom breast board and a custom neck mold was constructed for immobilization. Her right chest wall was marked with radiopaque wires along with a right mastectomy scar. She was then scanned. An isocenter was placed along the superior border of her tangents. Her normal anatomy was contoured. She was set up to medial and lateral right chest wall tangents. 2 sets of multileaf collimators were designed. She was then set up to LAO to right supraclavicular/axillary region with a separate and unique multileaf collimator designed. Lastly, she was set up PA to a PA axillary field with another unique multileaf collimator for a total of for multileaf collimators in addition to a custom neck mold/respiratory (5 complex treatment devices). I prescribing 5040 cGy to her right chest wall and regional lymph nodes and 28 sessions. The right chest wall will be treated tangentially with construction of 1.0 cm bolus on the first day of her treatment to be applied to her skin every other day. Right supraclavicular axillary region will be treated LAO and a depth of 3 cm and the PA right axilla field will supplement the midaxilla to a dose of 4600 cGy in 28 sessions. Lastly, she'll undergo a right chest wall/mastectomy scar boost for an additional 1000 cGy in 5 sessions with 6 MEV electrons.

## 2013-01-11 ENCOUNTER — Other Ambulatory Visit (HOSPITAL_BASED_OUTPATIENT_CLINIC_OR_DEPARTMENT_OTHER): Payer: Medicare Other | Admitting: Lab

## 2013-01-11 ENCOUNTER — Ambulatory Visit (HOSPITAL_BASED_OUTPATIENT_CLINIC_OR_DEPARTMENT_OTHER): Payer: Medicare Other

## 2013-01-11 DIAGNOSIS — C50511 Malignant neoplasm of lower-outer quadrant of right female breast: Secondary | ICD-10-CM

## 2013-01-11 DIAGNOSIS — C50519 Malignant neoplasm of lower-outer quadrant of unspecified female breast: Secondary | ICD-10-CM

## 2013-01-11 DIAGNOSIS — Z5112 Encounter for antineoplastic immunotherapy: Secondary | ICD-10-CM

## 2013-01-11 LAB — CBC WITH DIFFERENTIAL/PLATELET
Basophils Absolute: 0.1 10*3/uL (ref 0.0–0.1)
Eosinophils Absolute: 0.1 10*3/uL (ref 0.0–0.5)
HCT: 38.9 % (ref 34.8–46.6)
HGB: 13 g/dL (ref 11.6–15.9)
MCH: 32.1 pg (ref 25.1–34.0)
MONO#: 0.8 10*3/uL (ref 0.1–0.9)
NEUT%: 49.1 % (ref 38.4–76.8)
lymph#: 3.2 10*3/uL (ref 0.9–3.3)

## 2013-01-11 LAB — COMPREHENSIVE METABOLIC PANEL (CC13)
Albumin: 3.2 g/dL — ABNORMAL LOW (ref 3.5–5.0)
BUN: 16.3 mg/dL (ref 7.0–26.0)
CO2: 27 mEq/L (ref 22–29)
Calcium: 8.9 mg/dL (ref 8.4–10.4)
Chloride: 106 mEq/L (ref 98–107)
Glucose: 111 mg/dl — ABNORMAL HIGH (ref 70–99)
Potassium: 3.8 mEq/L (ref 3.5–5.1)

## 2013-01-11 MED ORDER — TRASTUZUMAB CHEMO INJECTION 440 MG
6.0000 mg/kg | Freq: Once | INTRAVENOUS | Status: AC
  Administered 2013-01-11: 546 mg via INTRAVENOUS
  Filled 2013-01-11: qty 26

## 2013-01-11 MED ORDER — DIPHENHYDRAMINE HCL 25 MG PO CAPS
50.0000 mg | ORAL_CAPSULE | Freq: Once | ORAL | Status: AC
  Administered 2013-01-11: 25 mg via ORAL

## 2013-01-11 MED ORDER — ACETAMINOPHEN 325 MG PO TABS
650.0000 mg | ORAL_TABLET | Freq: Once | ORAL | Status: AC
  Administered 2013-01-11: 650 mg via ORAL

## 2013-01-11 MED ORDER — SODIUM CHLORIDE 0.9 % IJ SOLN
10.0000 mL | INTRAMUSCULAR | Status: DC | PRN
  Administered 2013-01-11: 10 mL
  Filled 2013-01-11: qty 10

## 2013-01-11 MED ORDER — SODIUM CHLORIDE 0.9 % IV SOLN
Freq: Once | INTRAVENOUS | Status: AC
  Administered 2013-01-11: 15:00:00 via INTRAVENOUS

## 2013-01-11 MED ORDER — HEPARIN SOD (PORK) LOCK FLUSH 100 UNIT/ML IV SOLN
500.0000 [IU] | Freq: Once | INTRAVENOUS | Status: AC | PRN
  Administered 2013-01-11: 500 [IU]
  Filled 2013-01-11: qty 5

## 2013-01-11 NOTE — Patient Instructions (Signed)
[facility] Discharge Instructions for Patients Receiving Chemotherapy  Today you received the following chemotherapy agents Herceptin.    If you develop nausea and vomiting that is not controlled by your nausea medication, call the clinic.   BELOW ARE SYMPTOMS THAT SHOULD BE REPORTED IMMEDIATELY:  *FEVER GREATER THAN 100.5 F  *CHILLS WITH OR WITHOUT FEVER  NAUSEA AND VOMITING THAT IS NOT CONTROLLED WITH YOUR NAUSEA MEDICATION  *UNUSUAL SHORTNESS OF BREATH  *UNUSUAL BRUISING OR BLEEDING  TENDERNESS IN MOUTH AND THROAT WITH OR WITHOUT PRESENCE OF ULCERS  *URINARY PROBLEMS  *BOWEL PROBLEMS  UNUSUAL RASH Items with * indicate a potential emergency and should be followed up as soon as possible.  Feel free to call the clinic you have any questions or concerns. The clinic phone number is [phone].    

## 2013-01-12 ENCOUNTER — Ambulatory Visit
Admission: RE | Admit: 2013-01-12 | Discharge: 2013-01-12 | Disposition: A | Discharge status: Home / Self Care | Payer: Medicare Other | Source: Ambulatory Visit | Attending: Radiation Oncology | Admitting: Radiation Oncology

## 2013-01-12 DIAGNOSIS — C50511 Malignant neoplasm of lower-outer quadrant of right female breast: Secondary | ICD-10-CM

## 2013-01-12 NOTE — Progress Notes (Signed)
Simulation verification note: The patient underwent simulation verification for treatment to her right chest wall and regional lymph nodes.  Her isocenter is in good position and the multileaf collimators contoured the treatment volume appropriately. 
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
## 2013-01-12 NOTE — Addendum Note (Signed)
Encounter addended by: Delynn Flavin, RN on: 01/12/2013  6:47 PM<BR>     Documentation filed: Notes Section

## 2013-01-13 ENCOUNTER — Telehealth: Payer: Self-pay | Admitting: *Deleted

## 2013-01-13 ENCOUNTER — Ambulatory Visit: Payer: Medicare Other
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
## 2013-01-14 ENCOUNTER — Ambulatory Visit: Payer: Medicare Other

## 2013-01-17 ENCOUNTER — Ambulatory Visit: Admission: RE | Admit: 2013-01-17 | Payer: Medicare Other | Source: Ambulatory Visit

## 2013-01-17 ENCOUNTER — Encounter: Payer: Self-pay | Admitting: Oncology

## 2013-01-17 ENCOUNTER — Encounter: Payer: Self-pay | Admitting: *Deleted

## 2013-01-17 NOTE — Progress Notes (Signed)
Spoke with Office manager. She has not been making appts. Per Trey Paula it is ok to give her 50.00 gas card. She only has 22.19 left in [facility] fund. We will let her have the 50.00 card-- Timothy Lasso will let her know to come and get from me.

## 2013-01-17 NOTE — Progress Notes (Signed)
Spoke with Holly Parrish over the phone regarding her transportation needs. It was explained to her that an arrangement had been made to provide her with a $50.00 gas card. She understands that we went over her fund limit given her special set of circumstances. Holly Parrish was very appreciative of our assistance. She feels the gas card can bridge her over until she gets her next check. Her plan is to continue her treatment, will follow up with financial counseling.  Gretta Cool, LCSW Assistant Director Clinical Social Work

## 2013-01-18 ENCOUNTER — Ambulatory Visit: Payer: Medicare Other

## 2013-01-19 ENCOUNTER — Ambulatory Visit: Payer: Medicare Other

## 2013-01-20 ENCOUNTER — Ambulatory Visit: Payer: Medicare Other

## 2013-01-21 ENCOUNTER — Ambulatory Visit: Payer: Medicare Other

## 2013-01-24 ENCOUNTER — Ambulatory Visit
Admission: RE | Admit: 2013-01-24 | Discharge: 2013-01-24 | Disposition: A | Discharge status: Home / Self Care | Payer: Medicare Other | Source: Ambulatory Visit | Attending: Radiation Oncology | Admitting: Radiation Oncology

## 2013-01-25 ENCOUNTER — Encounter: Payer: Self-pay | Admitting: Radiation Oncology

## 2013-01-25 ENCOUNTER — Ambulatory Visit
Admission: RE | Admit: 2013-01-25 | Discharge: 2013-01-25 | Disposition: A | Discharge status: Home / Self Care | Payer: Medicare Other | Source: Ambulatory Visit | Attending: Radiation Oncology | Admitting: Radiation Oncology

## 2013-01-25 VITALS — BP 118/81 | HR 60 | Temp 97.7°F | Resp 20 | Wt 203.7 lb

## 2013-01-25 DIAGNOSIS — C50511 Malignant neoplasm of lower-outer quadrant of right female breast: Secondary | ICD-10-CM

## 2013-01-25 NOTE — Progress Notes (Signed)
Rad tx right chest wall com[pleted today, no skin changes, pt c/o burning where iinciionism, tearful not able to raise arm much stated she hurt it a few days ago at work,went to the pain clinic yesterday and they told her she had rotator cuff syndrome, she didn't get it xrayed, pain when lifing right arm, doesn't take xanax or pain meds  3:17 PM  3:17 PM
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
## 2013-01-26 ENCOUNTER — Ambulatory Visit
Admission: RE | Admit: 2013-01-26 | Discharge: 2013-01-26 | Disposition: A | Discharge status: Home / Self Care | Payer: Medicare Other | Source: Ambulatory Visit | Attending: Radiation Oncology | Admitting: Radiation Oncology

## 2013-01-27 ENCOUNTER — Ambulatory Visit
Admission: RE | Admit: 2013-01-27 | Discharge: 2013-01-27 | Disposition: A | Discharge status: Home / Self Care | Payer: Medicare Other | Source: Ambulatory Visit | Attending: Radiation Oncology | Admitting: Radiation Oncology

## 2013-01-31 ENCOUNTER — Ambulatory Visit: Payer: Medicare Other

## 2013-02-01 ENCOUNTER — Telehealth: Payer: Self-pay | Admitting: *Deleted

## 2013-02-01 ENCOUNTER — Other Ambulatory Visit: Payer: Medicare Other | Admitting: Lab

## 2013-02-01 ENCOUNTER — Ambulatory Visit: Payer: Medicare Other

## 2013-02-01 NOTE — Telephone Encounter (Signed)
Message left by charge nurse in infusion room reporting pt was an " no show " for scheduled infusion for herceptin today.  This note will be given to MD.

## 2013-02-01 NOTE — Telephone Encounter (Signed)
Pt lm stating that she wanted to r/s her missed appts today, because she will be in court...td

## 2013-02-02 ENCOUNTER — Ambulatory Visit
Admission: RE | Admit: 2013-02-02 | Discharge: 2013-02-02 | Disposition: A | Discharge status: Home / Self Care | Payer: Medicare Other | Source: Ambulatory Visit | Attending: Radiation Oncology | Admitting: Radiation Oncology

## 2013-02-03 ENCOUNTER — Ambulatory Visit
Admission: RE | Admit: 2013-02-03 | Discharge: 2013-02-03 | Disposition: A | Discharge status: Home / Self Care | Payer: Medicare Other | Source: Ambulatory Visit | Attending: Radiation Oncology | Admitting: Radiation Oncology

## 2013-02-03 DIAGNOSIS — C50511 Malignant neoplasm of lower-outer quadrant of right female breast: Secondary | ICD-10-CM

## 2013-02-03 NOTE — Progress Notes (Signed)
Weekly Management Note:  Site: Right chest wall/regional lymph nodes Current Dose:  900  cGy Projected Dose: 5040  cGy followed by right chest wall boost  Narrative: The patient is seen today for routine under treatment assessment. CBCT/MVCT images/port films were reviewed. The chart was reviewed.   Holly Parrish does report right chest wall/right parasternal discomfort which Holly Parrish's had prior to her radiation therapy. Her right shoulder mobility is improved and Holly Parrish does physical therapy on her own. Holly Parrish missed a few treatments this week because of family issues.  Physical Examination:   Wt Readings from Last 3 Encounters:  01/25/13 203 lb 11.2 oz (92.398 kg)  01/05/13 202 lb 9.6 oz (91.899 kg)  12/21/12 200 lb 11.2 oz (91.037 kg)   Temp Readings from Last 3 Encounters:  01/25/13 97.7 F (36.5 C) Oral  01/05/13 98.2 F (36.8 C)   12/21/12 98.1 F (36.7 C) Oral   BP Readings from Last 3 Encounters:  01/25/13 118/81  01/05/13 105/74  12/21/12 122/82   Pulse Readings from Last 3 Encounters:  01/25/13 60  01/05/13 67  12/21/12 78   There are no skin changes along the right chest wall and nodal regions.  Impression: Tolerating radiation therapy well. Of note is that Holly Parrish does carry a diagnosis of lupus, and I will inquire as to whether not this is being actively manage and whether not Holly Parrish has a history of having a vasculitis. Holly Parrish does not appear to be on any medication or immunotherapy for her lupus. I also emphasized the need for her to come in for daily treatments as scheduled.  Plan: Continue radiation therapy as planned.

## 2013-02-03 NOTE — Progress Notes (Signed)
Ms. Holly Parrish has received 5 fractions to her right breast.  she continues to have a burning and itching sensation ih her right breast and has tenderness in the sternal region.     Post sim education today with focus on skin care, fatigue and pain management.  Given the radiation therapy and You booklet.  Given this RN's card and informed that she will be seen every Monday by Dr. Raquel James or his partner, Dr. Basilio Cairo.

## 2013-02-04 ENCOUNTER — Ambulatory Visit
Admission: RE | Admit: 2013-02-04 | Discharge: 2013-02-04 | Disposition: A | Discharge status: Home / Self Care | Payer: Medicare Other | Source: Ambulatory Visit | Attending: Radiation Oncology | Admitting: Radiation Oncology

## 2013-02-07 ENCOUNTER — Ambulatory Visit
Admission: RE | Admit: 2013-02-07 | Discharge: 2013-02-07 | Disposition: A | Discharge status: Home / Self Care | Payer: Medicare Other | Source: Ambulatory Visit | Attending: Radiation Oncology | Admitting: Radiation Oncology

## 2013-02-07 VITALS — BP 113/72 | HR 72 | Temp 97.8°F | Ht 62.0 in | Wt 202.2 lb

## 2013-02-07 DIAGNOSIS — C50511 Malignant neoplasm of lower-outer quadrant of right female breast: Secondary | ICD-10-CM

## 2013-02-07 NOTE — Progress Notes (Addendum)
Holly Parrish here for weekly under treat visit.  She has had 7 fractions to her right breast.  She denies pain.  She does have fatigue.  She also has had nausea and vomiting for three days.  She is not able to keep anything down.  She is taking zofran.  She denies diarrhea and has not had a bowel movement for 4 days.  The skin on her right chest is pink.  She is using radiaplex gel twice a day.  She recently started taking a new form of gabapentin [employer]?) and is wondering if this is why she is vomiting and tired.

## 2013-02-07 NOTE — Progress Notes (Signed)
Weekly Management Note:  Site: Right chest wall/regional lymph node Current Dose:  1280  cGy Projected Dose: 5040 cGy followed by right chest wall boost    Narrative: The patient is seen today for routine under treatment assessment. CBCT/MVCT images/port films were reviewed. The chart was reviewed.   She uses Radioplex gel when necessary. Her major complaint today is that of nausea for the past 3 days. She has been taking Zofran when necessary which has been helpful.  She has a prescription "lupus" which she tells me was diagnosed by a non-rheumatologist back in 1997. She tells me she was on steroids for a short period of time then stop these because of weight gain. 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 She has not been on any anti-inflammatory agents include steroids since 1997. She had an ANA drawn in January the sugar and this was negative. My assumption is that she does not have active lupus and she not any difficulty with radiation toxicity.   Physical Examination:  Filed Vitals:   02/07/13 1424  BP: 113/72  Pulse: 72  Temp: 97.8 F (36.6 C)  .  Weight: 202 lb 3.2 oz (91.717 kg). There are no skin changes.  Impression: Tolerating radiation therapy well, except for nausea which I do not believe is related to her radiation therapy. She will follow up with medical oncology for her nausea. In the meantime, she will continue with her Zofran q. 8 hours.  Plan: Continue radiation therapy as planned.

## 2013-02-08 ENCOUNTER — Ambulatory Visit
Admission: RE | Admit: 2013-02-08 | Discharge: 2013-02-08 | Disposition: A | Discharge status: Home / Self Care | Payer: Medicare Other | Source: Ambulatory Visit | Attending: Radiation Oncology | Admitting: Radiation Oncology

## 2013-02-08 ENCOUNTER — Telehealth: Payer: Self-pay | Admitting: *Deleted

## 2013-02-08 NOTE — Telephone Encounter (Signed)
Per staff message I have rescheduled appt. [name]

## 2013-02-09 ENCOUNTER — Telehealth: Payer: Self-pay | Admitting: *Deleted

## 2013-02-09 ENCOUNTER — Ambulatory Visit
Admission: RE | Admit: 2013-02-09 | Discharge: 2013-02-09 | Disposition: A | Discharge status: Home / Self Care | Payer: Medicare Other | Source: Ambulatory Visit | Attending: Radiation Oncology | Admitting: Radiation Oncology

## 2013-02-09 ENCOUNTER — Encounter: Payer: Self-pay | Admitting: Radiation Oncology

## 2013-02-09 VITALS — BP 80/44 | HR 163 | Temp 97.7°F | Resp 18 | Wt 202.3 lb

## 2013-02-09 DIAGNOSIS — C50511 Malignant neoplasm of lower-outer quadrant of right female breast: Secondary | ICD-10-CM

## 2013-02-09 MED ORDER — OMEPRAZOLE 20 MG PO CPDR: 20.0000 mg | DELAYED_RELEASE_CAPSULE | Freq: Every day | ORAL | Status: DC

## 2013-02-09 MED ORDER — LORAZEPAM 1 MG PO TABS: 1.0000 mg | ORAL_TABLET | Freq: Two times a day (BID) | ORAL | Status: DC | PRN

## 2013-02-09 NOTE — Progress Notes (Signed)
Weekly Management Note Current Dose:16.2 Gy  Projected Dose:60.4 Gy   Narrative: Patient asked to be seen today due to nausea since Friday. Zofran not helping. Feels like she has to burp with associated middle to right upper quadrant chest pain. Feels better after she vomits. No headaches. Normal bowel movements. Has had a prior episode of pancreatitis which felt somewhat similar but not exactly. Feels closer to radiation oncology and wanted to be seen here before going upstairs.  Physical Findings:  Tearful.   Vitals:  Filed Vitals:   02/09/13 1425  BP: 80/44  Pulse: 163  Temp: 97.7 F (36.5 C)  Resp: 18   Weight:  Wt Readings from Last 3 Encounters:  02/09/13 202 lb 4.8 oz (91.763 kg)  02/07/13 202 lb 3.2 oz (91.717 kg)  01/25/13 203 lb 11.2 oz (92.398 kg)   Lab Results  Component Value Date   WBC 8.3 01/11/2013   HGB 13.0 01/11/2013   HCT 38.9 01/11/2013   MCV 96.0 01/11/2013   PLT 410* 01/11/2013   Lab Results  Component Value Date   CREATININE 1.0 01/11/2013   BUN 16.3 01/11/2013   NA 142 01/11/2013   K 3.8 01/11/2013   CL 106 01/11/2013   CO2 27 01/11/2013     Impression:  The patient is tolerating radiation.  Plan:  Continue treatment as planned. Try prevacid bid for possible GERD. Try lorazepam for nausea. Follow up tomorrow with RJM.  ? 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

## 2013-02-09 NOTE — Progress Notes (Addendum)
Patient reports that she has been on gabapentin since December. However, the neuropathy in her hands and feet keep her awake at night. Patient reports tearfully that she is exhausted and goes on to explain she has only had four hours of sleep. Patient reports persistent nausea but, denies emesis since Friday. Weight stable. Vitals orthostatic. Reports dizziness related to gabapentin. Reports that all she has eaten today has been crackers and a ginger ale. Reports taking zofran at 0730 but that it hasn't helped. Reports taking phenergan in the past but, it makes her so drowsy she can't work. Inquired about ativan and she confirms she has taken it in the past before bed. Patient states, "my belly really hurts" but is unable to rate or describe pain. Reports having a normal formed bowel movement yesterday. Denies diarrhea or constipation. No skin changes noted to right upper chest wall. Reports using radiaplex bid as directed.

## 2013-02-09 NOTE — Telephone Encounter (Signed)
Called patient cell phone ,she left before written RX of Ativan signed by MD, asked if she wanted me to call her pharmacy for her and she could pick it up on her way home, "NO, I have a few left, I'll pick up rx tomorroaw after treatment", informed her that RX  to be picked up in Nursing station. Patient thanked me for calling and to thank Dr.Wentworth 2:54 PM

## 2013-02-09 NOTE — Telephone Encounter (Signed)
Lm gv appt for a labs @2pm  , and tx @ 3pm for 02/10/13. Pt is aware...td

## 2013-02-10 ENCOUNTER — Ambulatory Visit (HOSPITAL_BASED_OUTPATIENT_CLINIC_OR_DEPARTMENT_OTHER): Payer: Medicare Other

## 2013-02-10 ENCOUNTER — Other Ambulatory Visit (HOSPITAL_BASED_OUTPATIENT_CLINIC_OR_DEPARTMENT_OTHER): Payer: Medicare Other | Admitting: Lab

## 2013-02-10 ENCOUNTER — Ambulatory Visit
Admission: RE | Admit: 2013-02-10 | Discharge: 2013-02-10 | Disposition: A | Discharge status: Home / Self Care | Payer: Medicare Other | Source: Ambulatory Visit | Attending: Radiation Oncology | Admitting: Radiation Oncology

## 2013-02-10 VITALS — BP 104/61 | HR 66 | Temp 97.6°F | Resp 18

## 2013-02-10 DIAGNOSIS — C50519 Malignant neoplasm of lower-outer quadrant of unspecified female breast: Secondary | ICD-10-CM

## 2013-02-10 DIAGNOSIS — C773 Secondary and unspecified malignant neoplasm of axilla and upper limb lymph nodes: Secondary | ICD-10-CM

## 2013-02-10 DIAGNOSIS — C50511 Malignant neoplasm of lower-outer quadrant of right female breast: Secondary | ICD-10-CM

## 2013-02-10 DIAGNOSIS — Z5112 Encounter for antineoplastic immunotherapy: Secondary | ICD-10-CM

## 2013-02-10 LAB — CBC WITH DIFFERENTIAL/PLATELET
Basophils Absolute: 0 10*3/uL (ref 0.0–0.1)
Eosinophils Absolute: 0.2 10*3/uL (ref 0.0–0.5)
HCT: 39.9 % (ref 34.8–46.6)
HGB: 13.3 g/dL (ref 11.6–15.9)
MCH: 32 pg (ref 25.1–34.0)
MONO#: 0.7 10*3/uL (ref 0.1–0.9)
NEUT#: 3 10*3/uL (ref 1.5–6.5)
NEUT%: 48.2 % (ref 38.4–76.8)
WBC: 6.1 10*3/uL (ref 3.9–10.3)
lymph#: 2.3 10*3/uL (ref 0.9–3.3)

## 2013-02-10 LAB — COMPREHENSIVE METABOLIC PANEL (CC13)
Albumin: 3.1 g/dL — ABNORMAL LOW (ref 3.5–5.0)
BUN: 13.2 mg/dL (ref 7.0–26.0)
CO2: 27 mEq/L (ref 22–29)
Calcium: 8.6 mg/dL (ref 8.4–10.4)
Chloride: 107 mEq/L (ref 98–109)
Glucose: 110 mg/dl (ref 70–140)
Potassium: 3.7 mEq/L (ref 3.5–5.1)

## 2013-02-10 MED ORDER — ACETAMINOPHEN 325 MG PO TABS
650.0000 mg | ORAL_TABLET | Freq: Once | ORAL | Status: AC
  Administered 2013-02-10: 650 mg via ORAL

## 2013-02-10 MED ORDER — DIPHENHYDRAMINE HCL 25 MG PO CAPS
50.0000 mg | ORAL_CAPSULE | Freq: Once | ORAL | Status: AC
  Administered 2013-02-10: 25 mg via ORAL

## 2013-02-10 MED ORDER — HEPARIN SOD (PORK) LOCK FLUSH 100 UNIT/ML IV SOLN
500.0000 [IU] | Freq: Once | INTRAVENOUS | Status: AC | PRN
  Administered 2013-02-10: 500 [IU]
  Filled 2013-02-10: qty 5

## 2013-02-10 MED ORDER — SODIUM CHLORIDE 0.9 % IV SOLN
Freq: Once | INTRAVENOUS | Status: AC
  Administered 2013-02-10: 16:00:00 via INTRAVENOUS

## 2013-02-10 MED ORDER — SODIUM CHLORIDE 0.9 % IJ SOLN
10.0000 mL | INTRAMUSCULAR | Status: DC | PRN
  Administered 2013-02-10: 10 mL
  Filled 2013-02-10: qty 10

## 2013-02-10 MED ORDER — TRASTUZUMAB CHEMO INJECTION 440 MG
6.0000 mg/kg | Freq: Once | INTRAVENOUS | Status: AC
  Administered 2013-02-10: 546 mg via INTRAVENOUS
  Filled 2013-02-10: qty 26

## 2013-02-10 NOTE — Patient Instructions (Addendum)
[facility] Discharge Instructions for Patients Receiving Chemotherapy  Today you received the following chemotherapy agents :  Herceptin.  To help prevent nausea and vomiting after your treatment, we encourage you to take your nausea medication as instructed by your physician.   If you develop nausea and vomiting that is not controlled by your nausea medication, call the clinic.   BELOW ARE SYMPTOMS THAT SHOULD BE REPORTED IMMEDIATELY:  *FEVER GREATER THAN 100.5 F  *CHILLS WITH OR WITHOUT FEVER  NAUSEA AND VOMITING THAT IS NOT CONTROLLED WITH YOUR NAUSEA MEDICATION  *UNUSUAL SHORTNESS OF BREATH  *UNUSUAL BRUISING OR BLEEDING  TENDERNESS IN MOUTH AND THROAT WITH OR WITHOUT PRESENCE OF ULCERS  *URINARY PROBLEMS  *BOWEL PROBLEMS  UNUSUAL RASH Items with * indicate a potential emergency and should be followed up as soon as possible.  Feel free to call the clinic you have any questions or concerns. The clinic phone number is [phone].   Trastuzumab injection for infusion What is this medicine? TRASTUZUMAB (tras TOO zoo mab) is a monoclonal antibody. It targets a protein called HER2. This protein is found in some stomach and breast cancers. This medicine can stop cancer cell growth. This medicine may be used with other cancer treatments. This medicine may be used for other purposes; ask your health care provider or pharmacist if you have questions. What should I tell my health care provider before I take this medicine? They need to know if you have any of these conditions: -heart disease -heart failure -infection (especially a virus infection such as chickenpox, cold sores, or herpes) -lung or breathing disease, like asthma -recent or ongoing radiation therapy -an unusual or allergic reaction to trastuzumab, benzyl alcohol, or other medications, foods, dyes, or preservatives -pregnant or trying to get pregnant -breast-feeding How should I use this  medicine? This drug is given as an infusion into a vein. It is administered in a hospital or clinic by a specially trained health care professional. Talk to your pediatrician regarding the use of this medicine in children. This medicine is not approved for use in children. Overdosage: If you think you have taken too much of this medicine contact a poison control center or emergency room at once. NOTE: This medicine is only for you. Do not share this medicine with others. What if I miss a dose? It is important not to miss a dose. Call your doctor or health care professional if you are unable to keep an appointment. What may interact with this medicine? -cyclophosphamide -doxorubicin -warfarin This list may not describe all possible interactions. Give your health care provider a list of all the medicines, herbs, non-prescription drugs, or dietary supplements you use. Also tell them if you smoke, drink alcohol, or use illegal drugs. Some items may interact with your medicine. What should I watch for while using this medicine? Visit your doctor for checks on your progress. Report any side effects. Continue your course of treatment even though you feel ill unless your doctor tells you to stop. Call your doctor or health care professional for advice if you get a fever, chills or sore throat, or other symptoms of a cold or flu. Do not treat yourself. Try to avoid being around people who are sick. You may experience fever, chills and shaking during your first infusion. These effects are usually mild and can be treated with other medicines. Report any side effects during the infusion to your health care professional. Fever and chills usually do not happen  with later infusions. What side effects may I notice from receiving this medicine? Side effects that you should report to your doctor or other health care professional as soon as possible: -breathing difficulties -chest pain or  palpitations -cough -dizziness or fainting -fever or chills, sore throat -skin rash, itching or hives -swelling of the legs or ankles -unusually weak or tired Side effects that usually do not require medical attention (report to your doctor or other health care professional if they continue or are bothersome): -loss of appetite -headache -muscle aches -nausea This list may not describe all possible side effects. Call your doctor for medical advice about side effects. You may report side effects to FDA at 1-800-FDA-1088. Where should I keep my medicine? This drug is given in a hospital or clinic and will not be stored at home. NOTE: This sheet is a summary. It may not cover all possible information. If you have questions about this medicine, talk to your doctor, pharmacist, or health care provider.  2013, [employer]. (05/18/2009 1:43:15 PM)

## 2013-02-11 ENCOUNTER — Ambulatory Visit
Admission: RE | Admit: 2013-02-11 | Discharge: 2013-02-11 | Disposition: A | Discharge status: Home / Self Care | Payer: Medicare Other | Source: Ambulatory Visit | Attending: Radiation Oncology | Admitting: Radiation Oncology

## 2013-02-11 ENCOUNTER — Telehealth: Payer: Self-pay | Admitting: *Deleted

## 2013-02-11 NOTE — Telephone Encounter (Signed)
sw pt gv appt for 03/08/13 @11am  to see dr. Clarise Cruz and have an echo. td

## 2013-02-14 ENCOUNTER — Ambulatory Visit
Admission: RE | Admit: 2013-02-14 | Discharge: 2013-02-14 | Disposition: A | Discharge status: Home / Self Care | Payer: Medicare Other | Source: Ambulatory Visit | Attending: Radiation Oncology | Admitting: Radiation Oncology

## 2013-02-14 ENCOUNTER — Ambulatory Visit: Payer: Medicare Other

## 2013-02-14 VITALS — BP 115/77 | HR 78 | Temp 98.1°F | Ht 62.0 in | Wt 203.7 lb

## 2013-02-14 DIAGNOSIS — C50511 Malignant neoplasm of lower-outer quadrant of right female breast: Secondary | ICD-10-CM

## 2013-02-14 NOTE — Progress Notes (Signed)
Holly Parrish here for weekly under treat visit.  She has had 12 fractions to her right breast.  She does have pain due to neuropathy in her hands and feet that she is rating at a 7/10.  She was recently switched to extended release gabapentin which she states is not working as well.  She is very fatigued.  She is also having nausea and abdonimal pain that started last week.  She was given lorazepam last week which has not helped.  She is sick to her stomach whenever she eats.  She is weepy today.  The skin on her right breast is intact.  She is using radiaplex twice a day.

## 2013-02-14 NOTE — Progress Notes (Signed)
   Weekly Management Note:  outpatient Current Dose:  21.6 Gy  Projected Dose: 50.4 Gy  + boost  Narrative:  The patient presents for routine under treatment assessment.  CBCT/MVCT images/Port film x-rays were reviewed.  The chart was checked. Still with nausea, vomiting, abdominal pain. Soft, regular stools.  Weight stable.  Labs reassuring from last week.  Zofran helps, but Ativan does not. Wonders if sx related to new drug, G[place]ise, for neuropathy  Physical Findings:  height is 5\' 2"  (1.575 m) and weight is 203 lb 11.2 oz (92.398 kg). Her temperature is 98.1 F (36.7 C). Her blood pressure is 115/77 and her pulse is 78.   Tearful. No rigidity distention or guarding of abdomen, but it is diffusely tender. Skin over R CW slightly red.  CBC    Component Value Date/Time   WBC 6.1 02/10/2013 1357   WBC 7.9 10/12/2012 1025   RBC 4.16 02/10/2013 1357   RBC 3.08* 10/12/2012 1025   HGB 13.3 02/10/2013 1357   HGB 10.4* 10/12/2012 1025   HCT 39.9 02/10/2013 1357   HCT 31.8* 10/12/2012 1025   PLT 353 02/10/2013 1357   PLT 542* 10/12/2012 1025   MCV 95.9 02/10/2013 1357   MCV 103.2* 10/12/2012 1025   MCH 32.0 02/10/2013 1357   MCH 33.8 10/12/2012 1025   MCHC 33.3 02/10/2013 1357   MCHC 32.7 10/12/2012 1025   RDW 13.9 02/10/2013 1357   RDW 13.2 10/12/2012 1025   LYMPHSABS 2.3 02/10/2013 1357   LYMPHSABS 3.1 10/11/2012 1130   MONOABS 0.7 02/10/2013 1357   MONOABS 1.1* 10/11/2012 1130   EOSABS 0.2 02/10/2013 1357   EOSABS 0.1 10/11/2012 1130   BASOSABS 0.0 02/10/2013 1357   BASOSABS 0.0 10/11/2012 1130    CMP     Component Value Date/Time   NA 142 02/10/2013 1357   NA 143 12/21/2012 1459   K 3.7 02/10/2013 1357   K 3.8 12/21/2012 1459   CL 106 01/11/2013 1437   CL 107 12/21/2012 1459   CO2 27 02/10/2013 1357   CO2 27 12/21/2012 1459   GLUCOSE 110 02/10/2013 1357   GLUCOSE 111* 01/11/2013 1437   GLUCOSE 106* 12/21/2012 1459   BUN 13.2 02/10/2013 1357   BUN 16 12/21/2012 1459   CREATININE 0.8 02/10/2013 1357   CREATININE 0.82 12/21/2012 1459   CALCIUM 8.6 02/10/2013 1357   CALCIUM 9.6 12/21/2012 1459   PROT 6.3* 02/10/2013 1357   PROT 7.4 12/21/2012 1459   ALBUMIN 3.1* 02/10/2013 1357   ALBUMIN 3.5 12/21/2012 1459   AST 30 02/10/2013 1357   AST 30 12/21/2012 1459   ALT 29 02/10/2013 1357   ALT 36* 12/21/2012 1459   ALKPHOS 120 02/10/2013 1357   ALKPHOS 137* 12/21/2012 1459   BILITOT 0.20 02/10/2013 1357   BILITOT 0.2* 12/21/2012 1459   GFRNONAA 77* 10/11/2012 1130   GFRAA 89* 10/11/2012 1130    Impression:  The patient is tolerating radiotherapy.  Plan:  Continue radiotherapy as planned. Try taking Zofran every 8 hours. I recommended she call her pain clinic to ask if her new medication could be contributing to her nausea. Her labs are reassuring and her vitals are reassuring. I asked her to notify us if  her condition worsens over the week ________________________________   Lonie Peak, M.D.

## 2013-02-15 ENCOUNTER — Ambulatory Visit
Admission: RE | Admit: 2013-02-15 | Discharge: 2013-02-15 | Disposition: A | Discharge status: Home / Self Care | Payer: Medicare Other | Source: Ambulatory Visit | Attending: Radiation Oncology | Admitting: Radiation Oncology

## 2013-02-16 ENCOUNTER — Ambulatory Visit
Admission: RE | Admit: 2013-02-16 | Discharge: 2013-02-16 | Disposition: A | Discharge status: Home / Self Care | Payer: Medicare Other | Source: Ambulatory Visit | Attending: Radiation Oncology | Admitting: Radiation Oncology

## 2013-02-17 ENCOUNTER — Ambulatory Visit
Admission: RE | Admit: 2013-02-17 | Discharge: 2013-02-17 | Disposition: A | Discharge status: Home / Self Care | Payer: Medicare Other | Source: Ambulatory Visit | Attending: Radiation Oncology | Admitting: Radiation Oncology

## 2013-02-18 ENCOUNTER — Ambulatory Visit
Admission: RE | Admit: 2013-02-18 | Discharge: 2013-02-18 | Disposition: A | Discharge status: Home / Self Care | Payer: Medicare Other | Source: Ambulatory Visit | Attending: Radiation Oncology | Admitting: Radiation Oncology

## 2013-02-20 ENCOUNTER — Ambulatory Visit: Payer: Medicare Other

## 2013-02-21 ENCOUNTER — Ambulatory Visit: Admission: RE | Admit: 2013-02-21 | Payer: Medicare Other | Source: Ambulatory Visit

## 2013-02-21 ENCOUNTER — Ambulatory Visit
Admission: RE | Admit: 2013-02-21 | Discharge: 2013-02-21 | Disposition: A | Discharge status: Home / Self Care | Payer: Medicare Other | Source: Ambulatory Visit | Attending: Radiation Oncology | Admitting: Radiation Oncology

## 2013-02-21 DIAGNOSIS — C50511 Malignant neoplasm of lower-outer quadrant of right female breast: Secondary | ICD-10-CM

## 2013-02-21 NOTE — Progress Notes (Signed)
Weekly rad txs l cw 17/28 completed, slight erythema on LCw skin intact, energy level tired a lot, using radiaplex gel bid

## 2013-02-21 NOTE — Progress Notes (Signed)
Weekly Management Note:  Site: Right chest wall/regional lymph nodes Current Dose:  3060  cGy Projected Dose: 5040  cGy  Narrative: The patient is seen today for routine under treatment assessment. CBCT/MVCT images/port films were reviewed. The chart was reviewed.   She still doing well but still has "morning sickness". Her nausea is almost always in the morning and does not respond well to Zofran. She wonders if it's related to gabapentin. She states that she could not be pregnant since she had tubal ligations at age 42. She has been sexually active. She has Radioplex gel to use when necessary.  Physical Examination: There were no vitals filed for this visit..  Weight:  . There is mild erythema the skin along the right chest wall/axilla. No areas of desquamation.  Impression: Tolerating radiation therapy well. Even though she had tubal ligations and is 42, I feel that she should have a pregnancy test. She would like to do this confidentially.  Plan: Continue radiation therapy as planned.

## 2013-02-22 ENCOUNTER — Ambulatory Visit (HOSPITAL_BASED_OUTPATIENT_CLINIC_OR_DEPARTMENT_OTHER): Payer: Medicare Other | Admitting: Physician Assistant

## 2013-02-22 ENCOUNTER — Encounter: Payer: Self-pay | Admitting: Physician Assistant

## 2013-02-22 ENCOUNTER — Other Ambulatory Visit (HOSPITAL_BASED_OUTPATIENT_CLINIC_OR_DEPARTMENT_OTHER): Payer: Medicare Other | Admitting: Lab

## 2013-02-22 ENCOUNTER — Ambulatory Visit
Admission: RE | Admit: 2013-02-22 | Discharge: 2013-02-22 | Disposition: A | Discharge status: Home / Self Care | Payer: Medicare Other | Source: Ambulatory Visit | Attending: Radiation Oncology | Admitting: Radiation Oncology

## 2013-02-22 VITALS — BP 111/72 | HR 60 | Temp 97.7°F | Resp 18 | Ht 62.0 in | Wt 200.2 lb

## 2013-02-22 DIAGNOSIS — G629 Polyneuropathy, unspecified: Secondary | ICD-10-CM

## 2013-02-22 DIAGNOSIS — K219 Gastro-esophageal reflux disease without esophagitis: Secondary | ICD-10-CM

## 2013-02-22 DIAGNOSIS — C50519 Malignant neoplasm of lower-outer quadrant of unspecified female breast: Secondary | ICD-10-CM

## 2013-02-22 DIAGNOSIS — R112 Nausea with vomiting, unspecified: Secondary | ICD-10-CM

## 2013-02-22 DIAGNOSIS — G609 Hereditary and idiopathic neuropathy, unspecified: Secondary | ICD-10-CM

## 2013-02-22 DIAGNOSIS — C50511 Malignant neoplasm of lower-outer quadrant of right female breast: Secondary | ICD-10-CM

## 2013-02-22 DIAGNOSIS — N912 Amenorrhea, unspecified: Secondary | ICD-10-CM

## 2013-02-22 LAB — CBC WITH DIFFERENTIAL/PLATELET
Basophils Absolute: 0 10*3/uL (ref 0.0–0.1)
EOS%: 3 % (ref 0.0–7.0)
Eosinophils Absolute: 0.1 10*3/uL (ref 0.0–0.5)
HGB: 14 g/dL (ref 11.6–15.9)
NEUT#: 1.5 10*3/uL (ref 1.5–6.5)
RBC: 4.44 10*6/uL (ref 3.70–5.45)
RDW: 14 % (ref 11.2–14.5)
lymph#: 1.5 10*3/uL (ref 0.9–3.3)
nRBC: 0 % (ref 0–0)

## 2013-02-22 LAB — COMPREHENSIVE METABOLIC PANEL (CC13)
ALT: 14 U/L (ref 0–55)
AST: 20 U/L (ref 5–34)
CO2: 27 mEq/L (ref 22–29)
Calcium: 9.6 mg/dL (ref 8.4–10.4)
Chloride: 105 mEq/L (ref 98–109)
Sodium: 142 mEq/L (ref 136–145)
Total Protein: 7.1 g/dL (ref 6.4–8.3)

## 2013-02-22 MED ORDER — OMEPRAZOLE 40 MG PO CPDR: 40.0000 mg | DELAYED_RELEASE_CAPSULE | Freq: Every day | ORAL | Status: DC

## 2013-02-22 MED ORDER — ONDANSETRON HCL 8 MG PO TABS: 8.0000 mg | ORAL_TABLET | Freq: Two times a day (BID) | ORAL | Status: DC | PRN

## 2013-02-22 NOTE — Progress Notes (Signed)
ID: Holly Parrish   DOB: [birth year]  MR#: [id number]  WUJ#:[id number]  PCP: No PCP Per Patient GYN:  SU: Emelia Loron OTHER MD:   HISTORY OF PRESENT ILLNESS: Allix noted a mass in her right breast early in 2013. She did not have any healthcare insurance and so delayed getting this looked after. She ultimately underwent a mammogram on 02/16/2012. This showed a mass in the right breast 8:00 position measuring 4 cm; it was firm and palpable. There was also a l 3 cm lower right axillary lymph node. Ultrasound confirmed the presence of the mass in the breast measuring 2.7 x 2.7 x 2.9 cm. There were 2 large right lower right axillary lymph nodes largest one measuring 4 cm a smaller one measuring 3.1 cm. She underwent biopsies of both these areas. 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 The tumor was also estrogen and progesterone receptor positive.  STAGE:  Cancer of lower-outer quadrant of female breast, right Primary site: Breast (Right)  Staging method: AJCC 7th Edition  Clinical: Stage IIB (T2, N1, cM0)  Summary: Stage IIB (T2, N1, cM0)  Remainder history as noted below.  INTERVAL HISTORY: [name]  returns today for followup of her right breast cancer. She underwent right mastectomy on 10/06/2012 and continues to receive trastuzumab every 3 weeks, the next of which is due on 03/03/2013. She is currently undergoing active radiation therapy under the care of Dr. Dayton Scrape, and this will be completed in mid August.  Overall, [name] is tolerating the radiation well. She does feel tired. She is tolerating the trastuzumab well and tells me "that is nothing". She continues to have chronic pain, especially in the lower extremities, and this is associated with chronic neuropathy. She is followed by Dr. Manon Hilding at the pain clinic. She tells me currently, the only pain medication she is taking his gabapentin at a dose of 1200 mg 3 times daily. Although she's had  no significant improvement in her pain, she tells me that, fortunately, it has "stopped getting worse".   Emotionally, [name] is still under quite a bit of stress, is still a little anxious, but seems in much better spirits today.   REVIEW OF SYSTEMS: [name] has had no recent illnesses and denies any fevers or chills. She's had no rashes or skin changes and no signs of bruising or abnormal bleeding. She has been having some problems with chronic nausea, and in fact occasionally vomits. This tends to occur only in the mornings, and improves by lunchtime. She's having regular bowel movements. She's had no increased cough, shortness of breath, chest pain, palpitations. No abnormal headaches. She's had no peripheral swelling.  A detailed review of systems is otherwise stable and noncontributory.   PAST MEDICAL HISTORY: Past Medical History  Diagnosis Date  . Lupus   . Joint pain   . Hearing loss   . Bilateral swelling of feet   . Breast cancer 40  . Cancer 37    Cervical, treated with cryoablation  . Asthma   . Neuromuscular disorder   . Migraines   . Headache(784.0)   . Arthritis   . Allergy   . Peripheral neuropathy     bilateral feet    PAST SURGICAL HISTORY: Past Surgical History  Procedure Laterality Date  . Tonsillectomy    . Cholecystectomy    . 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 Portacath placement  03/11/2012    Procedure: INSERTION PORT-A-CATH;  Surgeon:  Emelia Loron, MD;  Location: [facility];  Service: General;  Laterality: Left;  . Hand surgery  1997  . Appendectomy  1995  . Abdominal exploration surgery  [id number]  . Tubal ligation  1997  . Mastectomy modified radical Right 10/06/2012    Procedure: MASTECTOMY MODIFIED RADICAL WITH AXILLARY CONTENT;  Surgeon: Emelia Loron, MD;  Location: [facility];  Service: General;  Laterality: Right;    FAMILY HISTORY Family History  Problem Relation Age of Onset  . Uterine cancer Mother 38  .  Pancreatic cancer Paternal Uncle     diagnosed in his 13s; smoker; half uncle related through grandmother  . Cancer Paternal Uncle     Throat  . Liver cancer Maternal Grandmother     not a drinker  . Cancer Maternal Grandmother   . Breast cancer Cousin     3 paternal cousins with breast cancer, onset 28, 53 and one bilateral at  30 and 55  . Cancer Cousin     Breast  . Heart attack Father 62  . Breast cancer Paternal Grandmother 30  . Heart attack Paternal Grandfather   . Throat cancer Paternal Aunt     smoker; half uncle related through grandmother  . Breast cancer Paternal Aunt     2 paternal aunts, both with 2 diagnoses of breast cancer each  . Cystic fibrosis Cousin   . Cancer Cousin     Breast  . Cancer Cousin     Breast  . Breast cancer Paternal Aunt     GYNECOLOGIC HISTORY: G5 P5,  one daughter died at age 45; menarche at age 36; age of first live birth is 32; no recent history of birth control pill use; has normal menses.  SOCIAL HISTORY: From [place] where most of her family still resides.  Worked as a Fish farm manager.  She has been married twice and recently has been divorced.  She has 3 children who live in [place], one daughter who moved to this area.    ADVANCED DIRECTIVES:  HEALTH MAINTENANCE: History  Substance Use Topics  . Smoking status: Never Smoker   . Smokeless tobacco: Never Used  . Alcohol Use: No     Comment: occasional drinker     Colonoscopy:  PAP:  Bone density:  Lipid panel:  Allergies  Allergen Reactions  . Bee Venom Anaphylaxis  . Nsaids Anaphylaxis  . Penicillins Anaphylaxis  . Tetanus Toxoids Anaphylaxis  . Vitamin K And Related Anaphylaxis  . Hydrogen Peroxide Other (See Comments)    "It burns until I bleed."  . Banana Other (See Comments)    Face swells    Current Outpatient Prescriptions  Medication Sig Dispense Refill  . B Complex Vitamins (VITAMIN B COMPLEX PO) Take 1 tablet by mouth daily.       Marland Kitchen gabapentin  (NEURONTIN) 600 MG tablet Take 1,200 mg by mouth 3 (three) times daily. Patient states she is taking 3 tablets four times a day.      . lidocaine-prilocaine (EMLA) cream Apply topically as needed.  30 g  0  . omeprazole (PRILOSEC) 40 MG capsule Take 1 capsule (40 mg total) by mouth daily.  30 capsule  2  . ondansetron (ZOFRAN) 8 MG tablet Take 1 tablet (8 mg total) by mouth every 12 (twelve) hours as needed for nausea.  20 tablet  0  . PRESCRIPTION MEDICATION Inject into the vein every 30 (thirty) days. trastuzumab (HERCEPTIN) 546 mg in sodium chloride 0.9 % 250 mL  chemo infusion   6 mg/kg, 552 mL/hr      . docusate sodium (COLACE) 100 MG capsule Take 200 mg by mouth every morning.      Marland Kitchen EPINEPHrine (EPIPEN 2-PAK) 0.3 mg/0.3 mL DEVI Inject 0.3 mg into the muscle once. Bee venom       No current facility-administered medications for this visit.    OBJECTIVE: Young white female who appears slightly uncomfortable, but is in no acute distress Filed Vitals:   02/22/13 1331  BP: 111/72  Pulse: 60  Temp: 97.7 F (36.5 C)  Resp: 18     Body mass index is 36.61 kg/(m^2).    ECOG FS: 2 Filed Weights   02/22/13 1331  Weight: 200 lb 3.2 oz (90.81 kg)   Sclerae unicteric Lungs clear to auscultation bilaterally, no wheezes, no rales or rhonchi Heart regular rate and rhythm Abdomen soft, nontender, with positive bowel sounds Mild lymphedema noted in the right upper extremity, especially the right axilla. No edema noted in the lower extremities. Neuro: nonfocal, well oriented, tearful affect. Breasts: Status post right mastectomy with well-healed incision and no evidence of local recurrence. Axillae negative for adenopathy bilaterally.  Port is  intact in the left upper chest wall.   LAB RESULTS: Lab Results  Component Value Date   WBC 3.6* 02/22/2013   NEUTROABS 1.5 02/22/2013   HGB 14.0 02/22/2013   HCT 42.3 02/22/2013   MCV 95.3 02/22/2013   PLT 408* 02/22/2013      Chemistry       Component Value Date/Time   NA 142 02/22/2013 1313   NA 143 12/21/2012 1459   K 4.1 02/22/2013 1313   K 3.8 12/21/2012 1459   CL 106 01/11/2013 1437   CL 107 12/21/2012 1459   CO2 27 02/22/2013 1313   CO2 27 12/21/2012 1459   BUN 18.5 02/22/2013 1313   BUN 16 12/21/2012 1459   CREATININE 1.0 02/22/2013 1313   CREATININE 0.82 12/21/2012 1459      Component Value Date/Time   CALCIUM 9.6 02/22/2013 1313   CALCIUM 9.6 12/21/2012 1459   ALKPHOS 109 02/22/2013 1313   ALKPHOS 137* 12/21/2012 1459   AST 20 02/22/2013 1313   AST 30 12/21/2012 1459   ALT 14 02/22/2013 1313   ALT 36* 12/21/2012 1459   BILITOT 0.30 02/22/2013 1313   BILITOT 0.2* 12/21/2012 1459        STUDIES: This recent echocardiogram on 11/16/2012 showed an ejection fraction of 60 - 65%.  This is scheduled to be repeated on 03/08/2013.    ASSESSMENT: 42 y.o.   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   (2) treated in the neoadjuvant setting.  Status post:  (a)  3 cycles of Taxotere/carbo with Herceptin, with poor tolerance  (b)  One cycle of weekly Carbo/Taxol and Herceptin in early November 2013, with poor tolerance  (c)  2 doses of weekly Carbo/Gemzar with Herceptin with poor tolerance and worsening neuropathy.  Last dose of chemo given in early December 2013.  (3)  Herceptin to be continued for a total of one year (through August of 2014). Most recent echocardiogram on 11/16/2012 showed a well preserved ejection fraction of 60 - 65%.  (4)  status post right mastectomy 10/06/2012 with a residual 1.5 cm invasive ductal carcinoma, grade 2. Tumor was focally 0.1 cm from  deep margins. 0 of 22 lymph nodes were involved. (ypT1c ypN0)  (5)   currently receiving postmastectomy radiation. She'll be started on antiestrogen therapy once she has completed  radiation.  PLAN: Chela will continue to receive trastuzumab every 3 weeks through the end of August, her last 2 doses being scheduled for August 7 and August 28. She is already scheduled for repeat echocardiogram on August 12, and I will see her on August 19 for her next followup visit. At that time, she will have completed radiation therapy, and we will discuss beginning tamoxifen.  I have refilled Loxley's ondansetron today, and we're going to increase the omeprazole 40 mg daily to see if this helps with her nausea and occasional vomiting. She'll continue to be followed by Dr. Manon Hilding at the pain clinic for her chronic pain, and at this time she is taking only gabapentin, 1200 mg by mouth 3 times a day.  This plan was reviewed with [facility] who voices understanding and agreement. She knows to call with any changes or problems.     Lasalle Abee    02/22/2013

## 2013-02-23 ENCOUNTER — Telehealth: Payer: Self-pay | Admitting: *Deleted

## 2013-02-23 ENCOUNTER — Ambulatory Visit: Payer: Medicare Other

## 2013-02-23 ENCOUNTER — Encounter: Payer: Self-pay | Admitting: Oncology

## 2013-02-23 NOTE — Telephone Encounter (Signed)
Lm gv appts for labs and tx on 8/7 and 8/28. i also gv appt d/t for 03/15/13@ 2:45pm. Pt is aware that tx will follow on 8/7 and 8/28. i asked pt to stop by the schedulers to get a printout...Holly KitchenMarland Parrish

## 2013-02-23 NOTE — Telephone Encounter (Signed)
Per staff message and POF I have scheduled appts.  [name]  

## 2013-02-24 ENCOUNTER — Ambulatory Visit: Payer: Medicare Other

## 2013-02-24 ENCOUNTER — Ambulatory Visit
Admission: RE | Admit: 2013-02-24 | Discharge: 2013-02-24 | Disposition: A | Discharge status: Home / Self Care | Payer: Medicare Other | Source: Ambulatory Visit | Attending: Radiation Oncology | Admitting: Radiation Oncology

## 2013-02-25 ENCOUNTER — Ambulatory Visit
Admission: RE | Admit: 2013-02-25 | Discharge: 2013-02-25 | Disposition: A | Discharge status: Home / Self Care | Payer: Medicare Other | Source: Ambulatory Visit | Attending: Radiation Oncology | Admitting: Radiation Oncology

## 2013-02-25 ENCOUNTER — Ambulatory Visit: Payer: Medicare Other

## 2013-02-28 ENCOUNTER — Ambulatory Visit
Admission: RE | Admit: 2013-02-28 | Discharge: 2013-02-28 | Disposition: A | Discharge status: Home / Self Care | Payer: Medicare Other | Source: Ambulatory Visit | Attending: Radiation Oncology | Admitting: Radiation Oncology

## 2013-02-28 ENCOUNTER — Encounter: Payer: Self-pay | Admitting: Radiation Oncology

## 2013-02-28 VITALS — BP 99/66 | HR 73 | Resp 16 | Wt 199.2 lb

## 2013-02-28 DIAGNOSIS — C50511 Malignant neoplasm of lower-outer quadrant of right female breast: Secondary | ICD-10-CM

## 2013-02-28 NOTE — Progress Notes (Signed)
Mild hyperpigmentation of right upper chest wall and axilla noted without desquamation. Reports using radiaplex as directed. Reports fatigue. Patient reports difficulty sleeping related to neuropathy in both her feet. Patient prescribed neurontin for this pain. Patient tearful during our interaction today but, "unsure why." Reports nausea and vomiting continue despite taking zofran. One pound weight loss noted since last week.

## 2013-02-28 NOTE — Progress Notes (Signed)
Weekly Management Note:  Site: Right chest wall/regional lymph nodes Current Dose:  3780  cGy Projected Dose: 5040  cGy  Narrative: The patient is seen today for routine under treatment assessment. CBCT/MVCT images/port films were reviewed. The chart was reviewed.   She was noted treatment last week , "I slept through my treatment time". She uses Radioplex gel when necessary. No change in her baseline neuropathy or nausea.  Physical Examination:  Filed Vitals:   02/28/13 1426  BP: 99/66  Pulse: 73  Resp: 16  .  Weight: 199 lb 3.2 oz (90.357 kg). There is erythema along her right chest wall and nodal regions but no areas of desquamation. Her skin reaction is minimal thus far.  Impression: Tolerating radiation therapy well. I encouraged her to complete her therapy without any further interruptions.  Plan: Continue radiation therapy as planned.

## 2013-03-01 ENCOUNTER — Ambulatory Visit
Admission: RE | Admit: 2013-03-01 | Discharge: 2013-03-01 | Disposition: A | Discharge status: Home / Self Care | Payer: Medicare Other | Source: Ambulatory Visit | Attending: Radiation Oncology | Admitting: Radiation Oncology

## 2013-03-01 ENCOUNTER — Ambulatory Visit: Payer: Medicare Other

## 2013-03-02 ENCOUNTER — Ambulatory Visit
Admission: RE | Admit: 2013-03-02 | Discharge: 2013-03-02 | Disposition: A | Discharge status: Home / Self Care | Payer: Medicare Other | Source: Ambulatory Visit | Attending: Radiation Oncology | Admitting: Radiation Oncology

## 2013-03-02 ENCOUNTER — Ambulatory Visit: Payer: Medicare Other

## 2013-03-03 ENCOUNTER — Ambulatory Visit
Admission: RE | Admit: 2013-03-03 | Discharge: 2013-03-03 | Disposition: A | Discharge status: Home / Self Care | Payer: Medicare Other | Source: Ambulatory Visit | Attending: Radiation Oncology | Admitting: Radiation Oncology

## 2013-03-03 ENCOUNTER — Other Ambulatory Visit (HOSPITAL_BASED_OUTPATIENT_CLINIC_OR_DEPARTMENT_OTHER): Payer: Medicare Other | Admitting: Lab

## 2013-03-03 ENCOUNTER — Ambulatory Visit (HOSPITAL_BASED_OUTPATIENT_CLINIC_OR_DEPARTMENT_OTHER): Payer: Medicare Other

## 2013-03-03 VITALS — BP 112/62 | HR 77 | Temp 98.5°F

## 2013-03-03 DIAGNOSIS — C50511 Malignant neoplasm of lower-outer quadrant of right female breast: Secondary | ICD-10-CM

## 2013-03-03 DIAGNOSIS — N912 Amenorrhea, unspecified: Secondary | ICD-10-CM

## 2013-03-03 DIAGNOSIS — C50519 Malignant neoplasm of lower-outer quadrant of unspecified female breast: Secondary | ICD-10-CM

## 2013-03-03 DIAGNOSIS — Z5112 Encounter for antineoplastic immunotherapy: Secondary | ICD-10-CM

## 2013-03-03 LAB — COMPREHENSIVE METABOLIC PANEL (CC13)
ALT: 19 U/L (ref 0–55)
AST: 25 U/L (ref 5–34)
Alkaline Phosphatase: 108 U/L (ref 40–150)
BUN: 16.3 mg/dL (ref 7.0–26.0)
Calcium: 9.4 mg/dL (ref 8.4–10.4)
Chloride: 108 mEq/L (ref 98–109)
Creatinine: 0.8 mg/dL (ref 0.6–1.1)

## 2013-03-03 LAB — CBC WITH DIFFERENTIAL/PLATELET
BASO%: 1.6 % (ref 0.0–2.0)
EOS%: 2.1 % (ref 0.0–7.0)
LYMPH%: 23.7 % (ref 14.0–49.7)
MCH: 31.9 pg (ref 25.1–34.0)
MCHC: 33.9 g/dL (ref 31.5–36.0)
MONO#: 0.5 10*3/uL (ref 0.1–0.9)
Platelets: 379 10*3/uL (ref 145–400)
RBC: 4.76 10*6/uL (ref 3.70–5.45)
WBC: 4.9 10*3/uL (ref 3.9–10.3)
lymph#: 1.2 10*3/uL (ref 0.9–3.3)
nRBC: 0 % (ref 0–0)

## 2013-03-03 MED ORDER — SODIUM CHLORIDE 0.9 % IJ SOLN
10.0000 mL | INTRAMUSCULAR | Status: DC | PRN
  Filled 2013-03-03: qty 10

## 2013-03-03 MED ORDER — SODIUM CHLORIDE 0.9 % IV SOLN
Freq: Once | INTRAVENOUS | Status: AC
  Administered 2013-03-03: 16:00:00 via INTRAVENOUS

## 2013-03-03 MED ORDER — TRASTUZUMAB CHEMO INJECTION 440 MG
6.0000 mg/kg | Freq: Once | INTRAVENOUS | Status: AC
  Administered 2013-03-03: 546 mg via INTRAVENOUS
  Filled 2013-03-03: qty 26

## 2013-03-03 MED ORDER — ACETAMINOPHEN 325 MG PO TABS
650.0000 mg | ORAL_TABLET | Freq: Once | ORAL | Status: AC
  Administered 2013-03-03: 650 mg via ORAL

## 2013-03-03 MED ORDER — HEPARIN SOD (PORK) LOCK FLUSH 100 UNIT/ML IV SOLN
500.0000 [IU] | Freq: Once | INTRAVENOUS | Status: DC | PRN
  Filled 2013-03-03: qty 5

## 2013-03-03 MED ORDER — DIPHENHYDRAMINE HCL 25 MG PO CAPS
50.0000 mg | ORAL_CAPSULE | Freq: Once | ORAL | Status: AC
  Administered 2013-03-03: 25 mg via ORAL

## 2013-03-03 NOTE — Patient Instructions (Addendum)
[facility] Discharge Instructions for Patients Receiving Chemotherapy  Today you received the following chemotherapy agents Herceptin.  To help prevent nausea and vomiting after your treatment, we encourage you to take your nausea medication as prescribed.   If you develop nausea and vomiting that is not controlled by your nausea medication, call the clinic.   BELOW ARE SYMPTOMS THAT SHOULD BE REPORTED IMMEDIATELY:  *FEVER GREATER THAN 100.5 F  *CHILLS WITH OR WITHOUT FEVER  NAUSEA AND VOMITING THAT IS NOT CONTROLLED WITH YOUR NAUSEA MEDICATION  *UNUSUAL SHORTNESS OF BREATH  *UNUSUAL BRUISING OR BLEEDING  TENDERNESS IN MOUTH AND THROAT WITH OR WITHOUT PRESENCE OF ULCERS  *URINARY PROBLEMS  *BOWEL PROBLEMS  UNUSUAL RASH Items with * indicate a potential emergency and should be followed up as soon as possible.  Feel free to call the clinic you have any questions or concerns. The clinic phone number is [phone].    

## 2013-03-04 ENCOUNTER — Ambulatory Visit
Admission: RE | Admit: 2013-03-04 | Discharge: 2013-03-04 | Disposition: A | Discharge status: Home / Self Care | Payer: Medicare Other | Source: Ambulatory Visit | Attending: Radiation Oncology | Admitting: Radiation Oncology

## 2013-03-04 LAB — PREGNANCY, URINE: Preg Test, Ur: NEGATIVE

## 2013-03-07 ENCOUNTER — Encounter: Payer: Self-pay | Admitting: Radiation Oncology

## 2013-03-07 ENCOUNTER — Ambulatory Visit
Admission: RE | Admit: 2013-03-07 | Discharge: 2013-03-07 | Disposition: A | Discharge status: Home / Self Care | Payer: Medicare Other | Source: Ambulatory Visit | Attending: Radiation Oncology | Admitting: Radiation Oncology

## 2013-03-07 ENCOUNTER — Emergency Department (HOSPITAL_COMMUNITY)
Admission: EM | Admit: 2013-03-07 | Discharge: 2013-03-07 | Disposition: A | Discharge status: Home / Self Care | Payer: Medicare Other | Attending: Emergency Medicine | Admitting: Emergency Medicine

## 2013-03-07 ENCOUNTER — Other Ambulatory Visit: Payer: Self-pay | Admitting: Oncology

## 2013-03-07 ENCOUNTER — Telehealth: Payer: Self-pay | Admitting: Radiation Oncology

## 2013-03-07 ENCOUNTER — Encounter (HOSPITAL_COMMUNITY): Payer: Self-pay | Admitting: Emergency Medicine

## 2013-03-07 VITALS — BP 114/70 | HR 80 | Resp 16 | Wt 197.8 lb

## 2013-03-07 DIAGNOSIS — Z853 Personal history of malignant neoplasm of breast: Secondary | ICD-10-CM | POA: Insufficient documentation

## 2013-03-07 DIAGNOSIS — Z880 Allergy status to penicillin: Secondary | ICD-10-CM | POA: Insufficient documentation

## 2013-03-07 DIAGNOSIS — G8929 Other chronic pain: Secondary | ICD-10-CM | POA: Insufficient documentation

## 2013-03-07 DIAGNOSIS — Z8679 Personal history of other diseases of the circulatory system: Secondary | ICD-10-CM | POA: Insufficient documentation

## 2013-03-07 DIAGNOSIS — G609 Hereditary and idiopathic neuropathy, unspecified: Secondary | ICD-10-CM | POA: Insufficient documentation

## 2013-03-07 DIAGNOSIS — C50511 Malignant neoplasm of lower-outer quadrant of right female breast: Secondary | ICD-10-CM

## 2013-03-07 DIAGNOSIS — G629 Polyneuropathy, unspecified: Secondary | ICD-10-CM

## 2013-03-07 DIAGNOSIS — R5381 Other malaise: Secondary | ICD-10-CM

## 2013-03-07 DIAGNOSIS — Z8669 Personal history of other diseases of the nervous system and sense organs: Secondary | ICD-10-CM | POA: Insufficient documentation

## 2013-03-07 DIAGNOSIS — Z79899 Other long term (current) drug therapy: Secondary | ICD-10-CM | POA: Insufficient documentation

## 2013-03-07 DIAGNOSIS — Z8541 Personal history of malignant neoplasm of cervix uteri: Secondary | ICD-10-CM | POA: Insufficient documentation

## 2013-03-07 DIAGNOSIS — M329 Systemic lupus erythematosus, unspecified: Secondary | ICD-10-CM

## 2013-03-07 DIAGNOSIS — F329 Major depressive disorder, single episode, unspecified: Secondary | ICD-10-CM

## 2013-03-07 DIAGNOSIS — E538 Deficiency of other specified B group vitamins: Secondary | ICD-10-CM

## 2013-03-07 DIAGNOSIS — J45909 Unspecified asthma, uncomplicated: Secondary | ICD-10-CM | POA: Insufficient documentation

## 2013-03-07 DIAGNOSIS — Z8739 Personal history of other diseases of the musculoskeletal system and connective tissue: Secondary | ICD-10-CM | POA: Insufficient documentation

## 2013-03-07 DIAGNOSIS — R5383 Other fatigue: Secondary | ICD-10-CM

## 2013-03-07 MED ORDER — TRAMADOL HCL 50 MG PO TABS: 50.0000 mg | ORAL_TABLET | Freq: Four times a day (QID) | ORAL | Status: DC | PRN

## 2013-03-07 MED ORDER — TEMAZEPAM 15 MG PO CAPS: 15.0000 mg | ORAL_CAPSULE | Freq: Every evening | ORAL | Status: DC | PRN

## 2013-03-07 MED ORDER — HYDROMORPHONE HCL PF 2 MG/ML IJ SOLN
2.0000 mg | Freq: Once | INTRAMUSCULAR | Status: AC
  Administered 2013-03-07: 2 mg via INTRAMUSCULAR
  Filled 2013-03-07: qty 1

## 2013-03-07 NOTE — Progress Notes (Signed)
Patient tearful explains that she is so tired and exhausted. She reports that the neuropathy in her feet keep her awake at night despite the neurontin she takes. Also, she reports persistent nausea and vomiting despite taking zofran. Hyperpigmentation without desquamation of right chest wall noted. Reports using radiaplex gel as directed.

## 2013-03-07 NOTE — Telephone Encounter (Signed)
Verbal order place for B12 draw tomorrow following radiation therapy due to complaints of exhaustion/fatigue. Phoned patient requested she present to the lab following radiation treatment for blood work. She verbalized understanding.

## 2013-03-07 NOTE — Progress Notes (Signed)
   Weekly Management Note:  Outpatient Current Dose:  46.8 Gy  Projected Dose: 50.4 Gy + ?boost  Narrative:  The patient presents for routine under treatment assessment.  CBCT/MVCT images/Port film x-rays were reviewed.  The chart was checked. She reports extreme neuropathy in feet.  Insomnia, nausea, vomiting, anxiety.  Very Tearful.  Has B vitamin complex on med list - states not taking it - cannot tolerate it - I cannot find a recent vitB12 lab in records.  Physical Findings:  weight is 197 lb 12.8 oz (89.721 kg). Her blood pressure is 114/70 and her pulse is 80. Her respiration is 16.  Erythematous Right Chest wall. Skin a little dry. Tearful.  Impression:  The patient is tolerating radiotherapy.  Plan:  Continue radiotherapy as planned. Will try temazepam for sleep - 1 week trial - discussed potential side effects and that it is habit forming. Use PRN. Check Vit B12 to r/o deficiency in light of neuropathy (she did receive chemotherapy, which is likely cause, however). Continue current meds for chronic nausea.  Vitals look good.  ________________________________   Lonie Peak, M.D.

## 2013-03-07 NOTE — ED Notes (Addendum)
Pt states that she has been on gabapentin since February for neuropathy in her feet and it has become increasingly worse in the past 2 weeks. Only takes tylenol at home for pain. Pt is receiving radiation and chemo for breast CA right now. R mastectomy.

## 2013-03-07 NOTE — ED Provider Notes (Signed)
CSN: [id number]     Arrival date & time 03/07/13  1540 History  This chart was scribed for non-physician practitioner working with Suzi Roots, MD, by Ardelia Mems ED Scribe. This patient was seen in room WTR8/WTR8 and the patient's care was started at 4:38 PM.    Chief Complaint  Patient presents with  . Peripheral Neuropathy  . Foot Pain    The history is provided by the patient. No language interpreter was used.   HPI Comments: Holly Parrish is a 42 y.o. female with a history of peripheral neuropathy from chemotherapy for breast CA who presents to the Emergency Department complaining of long-standing bilateral neuropathic foot pain, which has acutely worsened in the past 2 weeks. She states that she is seen at a pain clinic and receives Gabapentin without relief of her pain. She states that the only other medication she has taken at home in the past 2 weeks has been Tylenol, which has offered little relief. She states that she has taken Tramadol and Percocet in the past, with which she states she has had more relief of pain in the past.   Past Medical History  Diagnosis Date  . Lupus   . Joint pain   . Hearing loss   . Bilateral swelling of feet   . Breast cancer 40  . Cancer 37    Cervical, treated with cryoablation  . Asthma   . Neuromuscular disorder   . Migraines   . Headache(784.0)   . Arthritis   . Allergy   . Peripheral neuropathy     bilateral feet   Past Surgical History  Procedure Laterality Date  . Tonsillectomy    . Cholecystectomy    . 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 Portacath placement  03/11/2012    Procedure: INSERTION PORT-A-CATH;  Surgeon: Emelia Loron, MD;  Location: [facility];  Service: General;  Laterality: Left;  . Hand surgery  1997  . Appendectomy  1995  . Abdominal exploration surgery  [id number]  . Tubal ligation  1997  . Mastectomy modified radical Right 10/06/2012    Procedure: MASTECTOMY MODIFIED RADICAL WITH  AXILLARY CONTENT;  Surgeon: Emelia Loron, MD;  Location: [facility];  Service: General;  Laterality: Right;   Family History  Problem Relation Age of Onset  . Uterine cancer Mother 30  . Pancreatic cancer Paternal Uncle     diagnosed in his 65s; smoker; half uncle related through grandmother  . Cancer Paternal Uncle     Throat  . Liver cancer Maternal Grandmother     not a drinker  . Cancer Maternal Grandmother   . Breast cancer Cousin     3 paternal cousins with breast cancer, onset 36, 62 and one bilateral at  49 and 74  . Cancer Cousin     Breast  . Heart attack Father 33  . Breast cancer Paternal Grandmother 68  . Heart attack Paternal Grandfather   . Throat cancer Paternal Aunt     smoker; half uncle related through grandmother  . Breast cancer Paternal Aunt     2 paternal aunts, both with 2 diagnoses of breast cancer each  . Cystic fibrosis Cousin   . Cancer Cousin     Breast  . Cancer Cousin     Breast  . Breast cancer Paternal Aunt    History  Substance Use Topics  . Smoking status: Never Smoker   . Smokeless tobacco: Never Used  . Alcohol  Use: No     Comment: occasional drinker   OB History   Grav Para Term Preterm Abortions TAB SAB Ect Mult Living                 Review of Systems A complete 10 system review of systems was obtained and all systems are negative except as noted in the HPI and PMH.   Allergies  Bee venom; Nsaids; Penicillins; Tetanus toxoids; Vitamin k and related; Hydrogen peroxide; and Banana  Home Medications   Current Outpatient Rx  Name  Route  Sig  Dispense  Refill  . acetaminophen (TYLENOL) 500 MG tablet   Oral   Take 1,000 mg by mouth every 6 (six) hours as needed for pain.         Marland Kitchen EPINEPHrine (EPIPEN 2-PAK) 0.3 mg/0.3 mL DEVI   Intramuscular   Inject 0.3 mg into the muscle once. Bee venom         . gabapentin (NEURONTIN) 600 MG tablet   Oral   Take 1,200-1,500 mg by mouth daily. Patient states she takes 1200 mg  daily but can increase to 1500 if needed         . omeprazole (PRILOSEC) 40 MG capsule   Oral   Take 1 capsule (40 mg total) by mouth daily.   30 capsule   2   . PRESCRIPTION MEDICATION   Intravenous   Inject into the vein every 30 (thirty) days. trastuzumab (HERCEPTIN) 546 mg in sodium chloride 0.9 % 250 mL chemo infusion   6 mg/kg, 552 mL/hr         . lidocaine-prilocaine (EMLA) cream   Topical   Apply topically as needed.   30 g   0   . ondansetron (ZOFRAN) 8 MG tablet   Oral   Take 1 tablet (8 mg total) by mouth every 12 (twelve) hours as needed for nausea.   20 tablet   0   . temazepam (RESTORIL) 15 MG capsule   Oral   Take 1 capsule (15 mg total) by mouth at bedtime as needed for sleep.   7 capsule   0    Triage Vitals: BP 119/85  Pulse 75  Temp(Src) 98.5 F (36.9 C) (Oral)  SpO2 97%  Physical Exam Physical Exam  Nursing note and vitals reviewed. Constitutional: She is oriented to person, place, and time. She appears well-developed and well-nourished. No distress.  HENT:  Head: Normocephalic and atraumatic.  Eyes: Conjunctivae normal and EOM are normal. Pupils are equal, round, and reactive to light. No scleral icterus.  Neck: Normal range of motion.  Cardiovascular: Normal rate, regular rhythm and normal heart sounds.  Exam reveals no gallop and no friction rub.   No murmur heard. Pulmonary/Chest: Effort normal and breath sounds normal. No respiratory distress.  Abdominal: Soft. Bowel sounds are normal. She exhibits no distension and no mass. There is no tenderness. There is no guarding.  Neurological: She is alert and oriented to person, place, and time.  Decreased sensation to light touch in the feet. Gait antalgic Skin: Skin is warm and dry. She is not diaphoretic.    ED Course   Medications  HYDROmorphone (DILAUDID) injection 2 mg (2 mg Intramuscular Given 03/07/13 1705)   Procedures (including critical care time)  DIAGNOSTIC  STUDIES: Oxygen Saturation is 97% on RA, normal by my interpretation.    COORDINATION OF CARE: 4:49 PM- Pt advised of plan for treatment and pt agrees.   Labs Reviewed - No data  to display  No results found.  1. Chronic pain   2. Peripheral neuropathy     MDM  Patient with chronic pain from peripheral neuropathy.  She states this is due to chemotherapy.  Patient been discharged from primary care for pain management is currently awaiting placement in pain management facility.  I discussed our emergency medicine policy of non-treatment of chronic pain.  I did discuss the fact that I could treat the patient in the ED for her acute pain.  Patient states he understands.  Patient will be given pain medications here.  I will refill her tramadol this time with the understanding that the emergency department would not refill any of these medications.      I personally performed the services described in this documentation, which was scribed in my presence. The recorded information has been reviewed and is accurate.     Arthor Captain, PA-C 03/08/13 2153

## 2013-03-08 ENCOUNTER — Inpatient Hospital Stay (HOSPITAL_COMMUNITY): Admission: RE | Admit: 2013-03-08 | Payer: Medicare Other | Source: Ambulatory Visit

## 2013-03-08 ENCOUNTER — Ambulatory Visit
Admission: RE | Admit: 2013-03-08 | Discharge: 2013-03-08 | Disposition: A | Discharge status: Home / Self Care | Payer: Medicare Other | Source: Ambulatory Visit | Attending: Radiation Oncology | Admitting: Radiation Oncology

## 2013-03-08 ENCOUNTER — Ambulatory Visit (HOSPITAL_COMMUNITY): Payer: Medicare Other | Attending: Internal Medicine

## 2013-03-08 ENCOUNTER — Other Ambulatory Visit: Payer: Self-pay | Admitting: Radiation Oncology

## 2013-03-08 DIAGNOSIS — C50919 Malignant neoplasm of unspecified site of unspecified female breast: Secondary | ICD-10-CM

## 2013-03-09 ENCOUNTER — Telehealth: Payer: Self-pay | Admitting: Radiation Oncology

## 2013-03-09 ENCOUNTER — Ambulatory Visit
Admission: RE | Admit: 2013-03-09 | Discharge: 2013-03-09 | Disposition: A | Discharge status: Home / Self Care | Payer: Medicare Other | Source: Ambulatory Visit | Attending: Radiation Oncology | Admitting: Radiation Oncology

## 2013-03-09 NOTE — Telephone Encounter (Signed)
Phoned patient as ordered by Dr. Basilio Cairo to inform her that her B12 lab is normal. No answer. Left message requesting return call.

## 2013-03-09 NOTE — Telephone Encounter (Signed)
Message copied by Agnes Lawrence on Wed Mar 09, 2013 12:53 PM ------      Message from: Lonie Peak E      Created: Wed Mar 09, 2013  7:55 AM       Please let her know this lab was normal.      Thanks for helping me order it!            ----- Message -----         From: [facility] Interface         Sent: 03/08/2013   7:53 PM           To: Lonie Peak, MD                   ------

## 2013-03-10 ENCOUNTER — Ambulatory Visit
Admission: RE | Admit: 2013-03-10 | Discharge: 2013-03-10 | Disposition: A | Discharge status: Home / Self Care | Payer: Medicare Other | Source: Ambulatory Visit | Attending: Radiation Oncology | Admitting: Radiation Oncology

## 2013-03-10 DIAGNOSIS — C50511 Malignant neoplasm of lower-outer quadrant of right female breast: Secondary | ICD-10-CM

## 2013-03-10 MED ORDER — BIAFINE EX EMUL
Freq: Two times a day (BID) | CUTANEOUS | Status: DC
  Administered 2013-03-10: 15:00:00 via TOPICAL

## 2013-03-10 NOTE — ED Provider Notes (Signed)
Medical screening examination/treatment/procedure(s) were performed by non-physician practitioner and as supervising physician I was immediately available for consultation/collaboration.   Suzi Roots, MD 03/10/13 [phone]

## 2013-03-10 NOTE — Progress Notes (Signed)
Holly Parrish came to the clinic asking nursing to check her skin to see if she needs to switch skin cream.  The skin on her right chest is red.  She said she had blisters in the top of the treatment area last night.  She put baking soda on them and they are not visibile today.  She was using radiaplex.  Switched her to biafine and instructed her to apply it twice a day after treatment and at bedtime.

## 2013-03-11 ENCOUNTER — Encounter (HOSPITAL_COMMUNITY): Payer: Self-pay | Admitting: *Deleted

## 2013-03-11 ENCOUNTER — Ambulatory Visit: Payer: Medicare Other

## 2013-03-14 ENCOUNTER — Encounter: Payer: Self-pay | Admitting: Radiation Oncology

## 2013-03-14 ENCOUNTER — Ambulatory Visit
Admission: RE | Admit: 2013-03-14 | Discharge: 2013-03-14 | Disposition: A | Discharge status: Home / Self Care | Payer: Medicare Other | Source: Ambulatory Visit | Attending: Radiation Oncology | Admitting: Radiation Oncology

## 2013-03-14 VITALS — BP 98/68 | HR 78 | Temp 98.5°F | Resp 20 | Wt 202.6 lb

## 2013-03-14 DIAGNOSIS — C50511 Malignant neoplasm of lower-outer quadrant of right female breast: Secondary | ICD-10-CM

## 2013-03-14 NOTE — Progress Notes (Signed)
Weekly rad txs, rt cw 30/33 completed, bright erythema on chest wall under axilla, peeling,drydesquamation, using radiaplex and biafine, suggested to use neosporin under peeled areas, , energy level poor, last Herceptin is August 28/14 Eating  But no appetite, still has nause, takes zofran fo that, and new pain med tramadol 2:44 PM

## 2013-03-14 NOTE — Progress Notes (Signed)
Weekly Management Note:  Site: Right chest wall boost Current Dose:  5440  cGy Projected Dose: 6040  cGy  Narrative: The patient is seen today for routine under treatment assessment. CBCT/MVCT images/port films were reviewed. The chart was reviewed.   She still doing well but does have discomfort along her right axilla as expected. Her last Herceptin is August 28. She has been using Biafine cream.  Physical Examination:  Filed Vitals:   03/14/13 1439  BP: 98/68  Pulse: 78  Temp: 98.5 F (36.9 C)  Resp: 20  .  Weight: 202 lb 9.6 oz (91.899 kg). There is diffuse erythema along the right chest wall with area moist desquamation along the lower axilla over an area of approximately 3.5-4 cm. There is dry desquamation elsewhere. There is no lymphedema.  Impression: Tolerating radiation therapy well. I surgery to apply Neosporin ointment at least twice a day.  Plan: Continue radiation therapy as planned. I'll see her again this Thursday after her final treatment.

## 2013-03-15 ENCOUNTER — Ambulatory Visit: Payer: Medicare Other

## 2013-03-15 ENCOUNTER — Encounter: Payer: Self-pay | Admitting: Physician Assistant

## 2013-03-15 ENCOUNTER — Ambulatory Visit: Payer: Medicare Other | Admitting: Physician Assistant

## 2013-03-15 ENCOUNTER — Other Ambulatory Visit: Payer: Self-pay | Admitting: Physician Assistant

## 2013-03-15 NOTE — Progress Notes (Signed)
[facility] today.  Letter mailed to patient.   Iliani Vejar, PA-C 03/15/2013 

## 2013-03-16 ENCOUNTER — Ambulatory Visit: Payer: Medicare Other

## 2013-03-16 ENCOUNTER — Ambulatory Visit
Admission: RE | Admit: 2013-03-16 | Discharge: 2013-03-16 | Disposition: A | Discharge status: Home / Self Care | Payer: Medicare Other | Source: Ambulatory Visit | Attending: Radiation Oncology | Admitting: Radiation Oncology

## 2013-03-17 ENCOUNTER — Ambulatory Visit
Admission: RE | Admit: 2013-03-17 | Discharge: 2013-03-17 | Disposition: A | Discharge status: Home / Self Care | Payer: Medicare Other | Source: Ambulatory Visit | Attending: Radiation Oncology | Admitting: Radiation Oncology

## 2013-03-17 ENCOUNTER — Ambulatory Visit: Payer: Medicare Other

## 2013-03-17 ENCOUNTER — Encounter: Payer: Self-pay | Admitting: Radiation Oncology

## 2013-03-17 VITALS — BP 104/76 | HR 83 | Temp 98.8°F | Resp 20 | Wt 201.0 lb

## 2013-03-17 DIAGNOSIS — C50511 Malignant neoplasm of lower-outer quadrant of right female breast: Secondary | ICD-10-CM

## 2013-03-17 MED ORDER — AZITHROMYCIN 250 MG PO TABS: ORAL_TABLET | ORAL | Status: DC

## 2013-03-17 MED ORDER — HYDROCOD POLST-CHLORPHEN POLST 10-8 MG/5ML PO LQCR: 5.0000 mL | Freq: Two times a day (BID) | ORAL | Status: DC

## 2013-03-17 NOTE — Progress Notes (Signed)
Weekly Management Note:  Site: Right chest wall boost Current Dose:  5840  cGy Projected Dose: 6040  cGy  Narrative: The patient is seen today for routine under treatment assessment. CBCT/MVCT images/port films were reviewed. The chart was reviewed.   She seen today complaining of "bronchitis" and has been going on for almost one week. She is responded to a Z-Pak in the past.  Physical Examination:  Filed Vitals:   03/17/13 1432  BP: 104/76  Pulse: 83  Temp: 98.8 F (37.1 C)  Resp: 20  .  Weight: 201 lb (91.173 kg). Alert and oriented. On inspection of her right chest wall there is dry desquamation along the chest wall and axilla with reepithelialization of her moist desquamation. Lungs are clear, except for a few scattered rhonchi.  Impression: Tolerating radiation therapy well, although she may have bronchitis.  Plan: Continue radiation therapy as planned. I placed her on a Z-Pak and also prescribed Tussionex for her cough. She'll finish her radiation therapy tomorrow and return to see me for a followup visit in one month.

## 2013-03-17 NOTE — Progress Notes (Addendum)
Pt c/o HA, sore throat, prod cough w/yellow sputum, left earache, rhinorrhea, generalized aches and feeling like she has cold. Pt states she has taken Dayquil w/no relief and states she cannot take Nyquil because it "makes her crazy". Pt states when she gets a bad cold she is usually prescribed a Z pack and Tussinex. She states she has no PCP. Pt applying Neosporin in right axilla and Biafine on right chest wall. She has dry desquamation in axilla and states her chest wall "feels like it's wrapped in Saran wrap".  Pt completes radiation tomorrow, has FU card.

## 2013-03-18 ENCOUNTER — Ambulatory Visit
Admission: RE | Admit: 2013-03-18 | Discharge: 2013-03-18 | Disposition: A | Discharge status: Home / Self Care | Payer: Medicare Other | Source: Ambulatory Visit | Attending: Radiation Oncology | Admitting: Radiation Oncology

## 2013-03-21 ENCOUNTER — Encounter: Payer: Self-pay | Admitting: Radiation Oncology

## 2013-03-21 NOTE — Progress Notes (Signed)
Electron beam simulation note: The patient underwent virtual simulation for her right chest wall mastectomy scar boost on 03/09/2013. She was set up en face to her right chest wall. One custom block was constructed to conform the field. A special port plan was requested. I prescribed 1000 cGy in 5 sessions utilizing 6 MEV electrons. 0.8 cm custom bolus was constructed in applied to the skin on the first day of her treatment.

## 2013-03-21 NOTE — Progress Notes (Signed)
[facility] Radiation Oncology End of Treatment Note  Name:Holly Parrish  Date: 03/21/2013 ZOX:[id number] DOB:[birth year]   Status:outpatient    CC: Dr. Emelia Loron  REFERRING PHYSICIAN:   Dr. Emelia Loron   DIAGNOSIS: Clinical stage II B (T2 N1 M0), pathologic stage I (ypT1c ypN0 M0) invasive ductal carcinoma of the right breast  INDICATION FOR TREATMENT: Curative   TREATMENT DATES: 01/25/2013 through 03/18/2013                        .   SITE/DOSE:  Right chest wall/regional lymph nodes 5040 cGy 28 sessions, right mastectomy scar boost 1000 cGy 5 sessions                          BEAMS/ENERGY:  6 MV photons tangential fields to the right breast, and PA axilla to bring the midaxillary plane dose up to 4600 cGy in 20 sessions. 1.0 cm custom bolus applied to the right chest wall every other day to maximize the dose to the skin surface. 10 MV photons, LAO to the right supraclavicular/axillary region, doses prescribed at 3 cm depth. 6 MEV electrons right mastectomy scar/chest wall boost with 0.8 cm bolus applied to the skin to maximize the dose to the skin surface                 NARRATIVE:  The patient tolerated treatment reasonable well with the expected degree of radiation dermatitis resulting in a focal moist desquamation along her lower right axilla by completion of therapy. She was treated with Radioplex gel, Biafine cream, and Neosporin ointment for her moist desquamation. She periodic nausea during course of therapy along with missing numerous treatments.                          PLAN: Routine followup in one month. Patient instructed to call if questions or worsening complaints in interim.

## 2013-03-24 ENCOUNTER — Other Ambulatory Visit (HOSPITAL_BASED_OUTPATIENT_CLINIC_OR_DEPARTMENT_OTHER): Payer: Medicare Other | Admitting: Lab

## 2013-03-24 ENCOUNTER — Ambulatory Visit (HOSPITAL_BASED_OUTPATIENT_CLINIC_OR_DEPARTMENT_OTHER): Payer: Medicare Other

## 2013-03-24 VITALS — BP 106/75 | HR 79 | Temp 97.7°F

## 2013-03-24 DIAGNOSIS — C773 Secondary and unspecified malignant neoplasm of axilla and upper limb lymph nodes: Secondary | ICD-10-CM

## 2013-03-24 DIAGNOSIS — C50511 Malignant neoplasm of lower-outer quadrant of right female breast: Secondary | ICD-10-CM

## 2013-03-24 DIAGNOSIS — Z5112 Encounter for antineoplastic immunotherapy: Secondary | ICD-10-CM

## 2013-03-24 DIAGNOSIS — C50519 Malignant neoplasm of lower-outer quadrant of unspecified female breast: Secondary | ICD-10-CM

## 2013-03-24 LAB — CBC WITH DIFFERENTIAL/PLATELET
BASO%: 0.9 % (ref 0.0–2.0)
Basophils Absolute: 0.1 10*3/uL (ref 0.0–0.1)
HCT: 43.9 % (ref 34.8–46.6)
LYMPH%: 20.9 % (ref 14.0–49.7)
MCH: 32.5 pg (ref 25.1–34.0)
MCHC: 33.7 g/dL (ref 31.5–36.0)
MONO#: 0.4 10*3/uL (ref 0.1–0.9)
NEUT%: 68.9 % (ref 38.4–76.8)
Platelets: 416 10*3/uL — ABNORMAL HIGH (ref 145–400)
WBC: 5.4 10*3/uL (ref 3.9–10.3)

## 2013-03-24 LAB — COMPREHENSIVE METABOLIC PANEL (CC13)
AST: 21 U/L (ref 5–34)
BUN: 17.9 mg/dL (ref 7.0–26.0)
Calcium: 9.7 mg/dL (ref 8.4–10.4)
Chloride: 107 mEq/L (ref 98–109)
Creatinine: 0.9 mg/dL (ref 0.6–1.1)
Glucose: 137 mg/dl (ref 70–140)

## 2013-03-24 MED ORDER — SODIUM CHLORIDE 0.9 % IJ SOLN
10.0000 mL | INTRAMUSCULAR | Status: DC | PRN
  Administered 2013-03-24: 10 mL
  Filled 2013-03-24: qty 10

## 2013-03-24 MED ORDER — TRASTUZUMAB CHEMO INJECTION 440 MG
6.0000 mg/kg | Freq: Once | INTRAVENOUS | Status: AC
  Administered 2013-03-24: 546 mg via INTRAVENOUS
  Filled 2013-03-24: qty 26

## 2013-03-24 MED ORDER — ACETAMINOPHEN 325 MG PO TABS
650.0000 mg | ORAL_TABLET | Freq: Once | ORAL | Status: AC
  Administered 2013-03-24: 650 mg via ORAL

## 2013-03-24 MED ORDER — SODIUM CHLORIDE 0.9 % IV SOLN
Freq: Once | INTRAVENOUS | Status: AC
  Administered 2013-03-24: 14:00:00 via INTRAVENOUS

## 2013-03-24 MED ORDER — HEPARIN SOD (PORK) LOCK FLUSH 100 UNIT/ML IV SOLN
500.0000 [IU] | Freq: Once | INTRAVENOUS | Status: AC | PRN
  Administered 2013-03-24: 500 [IU]
  Filled 2013-03-24: qty 5

## 2013-03-24 MED ORDER — DIPHENHYDRAMINE HCL 25 MG PO CAPS
50.0000 mg | ORAL_CAPSULE | Freq: Once | ORAL | Status: AC
  Administered 2013-03-24: 25 mg via ORAL

## 2013-03-24 NOTE — Patient Instructions (Signed)
[facility] Discharge Instructions for Patients Receiving Chemotherapy  Today you received the following chemotherapy agents Herceptin.  To help prevent nausea and vomiting after your treatment, we encourage you to take your nausea medication as prescribed.   If you develop nausea and vomiting that is not controlled by your nausea medication, call the clinic.   BELOW ARE SYMPTOMS THAT SHOULD BE REPORTED IMMEDIATELY:  *FEVER GREATER THAN 100.5 F  *CHILLS WITH OR WITHOUT FEVER  NAUSEA AND VOMITING THAT IS NOT CONTROLLED WITH YOUR NAUSEA MEDICATION  *UNUSUAL SHORTNESS OF BREATH  *UNUSUAL BRUISING OR BLEEDING  TENDERNESS IN MOUTH AND THROAT WITH OR WITHOUT PRESENCE OF ULCERS  *URINARY PROBLEMS  *BOWEL PROBLEMS  UNUSUAL RASH Items with * indicate a potential emergency and should be followed up as soon as possible.  Feel free to call the clinic you have any questions or concerns. The clinic phone number is [phone].    

## 2013-04-15 ENCOUNTER — Encounter: Payer: Self-pay | Admitting: Radiation Oncology

## 2013-04-19 ENCOUNTER — Ambulatory Visit
Admission: RE | Admit: 2013-04-19 | Discharge: 2013-04-19 | Disposition: A | Discharge status: Home / Self Care | Payer: Medicare Other | Source: Ambulatory Visit | Attending: Radiation Oncology | Admitting: Radiation Oncology

## 2013-04-19 ENCOUNTER — Other Ambulatory Visit: Payer: Self-pay | Admitting: Physician Assistant

## 2013-04-19 ENCOUNTER — Encounter: Payer: Self-pay | Admitting: Radiation Oncology

## 2013-04-19 VITALS — BP 118/82 | HR 62 | Temp 97.7°F | Resp 20 | Wt 209.2 lb

## 2013-04-19 DIAGNOSIS — C50511 Malignant neoplasm of lower-outer quadrant of right female breast: Secondary | ICD-10-CM

## 2013-04-19 NOTE — Progress Notes (Signed)
Pt denies pain, loss of appetite, states her energy level is improving. She states her skin of right chest wall has healed. She is receiving Herceptin every 3 weeks.

## 2013-04-19 NOTE — Progress Notes (Signed)
CC: Dr. Marikay Alar Magrinat  Followup note:  Holly Parrish returns today approximately 1 month following completion of post mastectomy radiation therapy to her right chest wall and regional lymph nodes in the management of her locally advanced invasive ductal carcinoma of the right breast. She tells me that she had Herceptin on August 28. She does not recalled missing a medical oncology appointment on August 19. She claims that she does not have a followup appointment, nor is she clear that whether not she has any additional Herceptin therapy. A note in  the medical record indicated that a letter sent, but she denies having received any letter of not having shown up for her last appointment. She generally feels well.  Physical examination: Alert and oriented. Filed Vitals:   04/19/13 1036  BP: 118/82  Pulse: 62  Temp: 97.7 F (36.5 C)  Resp: 20   Head and neck examination: Grossly unremarkable. Nodes: Without palpable cervical, supraclavicular, or axillary lymphadenopathy. Chest: There is residual erythema/hyperpigmentation the skin along the right chest wall and axilla. No visible or palpable evidence for recurrent disease along her right chest wall. Lungs are clear. Left breast without masses or lesions. Abdomen: Without hepatomegaly. Extremities: Without edema  Impression: Satisfactory progress.  Plan: I instructed her to contact medical oncology to schedule a new followup appointment and to see she has any remaining Herceptin to be delivered. There appears to have been some miscommunication.

## 2013-04-26 ENCOUNTER — Telehealth: Payer: Self-pay | Admitting: Oncology

## 2013-05-07 ENCOUNTER — Emergency Department (HOSPITAL_COMMUNITY): Payer: Medicare Other

## 2013-05-07 ENCOUNTER — Encounter (HOSPITAL_COMMUNITY): Payer: Self-pay | Admitting: Emergency Medicine

## 2013-05-07 ENCOUNTER — Emergency Department (HOSPITAL_COMMUNITY)
Admission: EM | Admit: 2013-05-07 | Discharge: 2013-05-08 | Disposition: A | Discharge status: Home / Self Care | Payer: Medicare Other | Attending: Emergency Medicine | Admitting: Emergency Medicine

## 2013-05-07 DIAGNOSIS — Z3202 Encounter for pregnancy test, result negative: Secondary | ICD-10-CM | POA: Insufficient documentation

## 2013-05-07 DIAGNOSIS — M549 Dorsalgia, unspecified: Secondary | ICD-10-CM | POA: Insufficient documentation

## 2013-05-07 DIAGNOSIS — N898 Other specified noninflammatory disorders of vagina: Secondary | ICD-10-CM | POA: Insufficient documentation

## 2013-05-07 DIAGNOSIS — R112 Nausea with vomiting, unspecified: Secondary | ICD-10-CM | POA: Insufficient documentation

## 2013-05-07 DIAGNOSIS — D473 Essential (hemorrhagic) thrombocythemia: Secondary | ICD-10-CM | POA: Insufficient documentation

## 2013-05-07 DIAGNOSIS — H9190 Unspecified hearing loss, unspecified ear: Secondary | ICD-10-CM | POA: Insufficient documentation

## 2013-05-07 DIAGNOSIS — G43909 Migraine, unspecified, not intractable, without status migrainosus: Secondary | ICD-10-CM | POA: Insufficient documentation

## 2013-05-07 DIAGNOSIS — Z923 Personal history of irradiation: Secondary | ICD-10-CM | POA: Insufficient documentation

## 2013-05-07 DIAGNOSIS — R1032 Left lower quadrant pain: Secondary | ICD-10-CM | POA: Insufficient documentation

## 2013-05-07 DIAGNOSIS — Z880 Allergy status to penicillin: Secondary | ICD-10-CM | POA: Insufficient documentation

## 2013-05-07 DIAGNOSIS — Z853 Personal history of malignant neoplasm of breast: Secondary | ICD-10-CM | POA: Insufficient documentation

## 2013-05-07 DIAGNOSIS — Z8739 Personal history of other diseases of the musculoskeletal system and connective tissue: Secondary | ICD-10-CM | POA: Insufficient documentation

## 2013-05-07 DIAGNOSIS — N939 Abnormal uterine and vaginal bleeding, unspecified: Secondary | ICD-10-CM

## 2013-05-07 DIAGNOSIS — Z79899 Other long term (current) drug therapy: Secondary | ICD-10-CM | POA: Insufficient documentation

## 2013-05-07 DIAGNOSIS — Z9221 Personal history of antineoplastic chemotherapy: Secondary | ICD-10-CM | POA: Insufficient documentation

## 2013-05-07 DIAGNOSIS — R42 Dizziness and giddiness: Secondary | ICD-10-CM | POA: Insufficient documentation

## 2013-05-07 DIAGNOSIS — J45909 Unspecified asthma, uncomplicated: Secondary | ICD-10-CM | POA: Insufficient documentation

## 2013-05-07 DIAGNOSIS — R51 Headache: Secondary | ICD-10-CM | POA: Insufficient documentation

## 2013-05-07 DIAGNOSIS — Z8541 Personal history of malignant neoplasm of cervix uteri: Secondary | ICD-10-CM | POA: Insufficient documentation

## 2013-05-07 DIAGNOSIS — R1031 Right lower quadrant pain: Secondary | ICD-10-CM | POA: Insufficient documentation

## 2013-05-07 LAB — COMPREHENSIVE METABOLIC PANEL
Alkaline Phosphatase: 122 U/L — ABNORMAL HIGH (ref 39–117)
BUN: 13 mg/dL (ref 6–23)
CO2: 27 mEq/L (ref 19–32)
Chloride: 104 mEq/L (ref 96–112)
Creatinine, Ser: 0.82 mg/dL (ref 0.50–1.10)
GFR calc non Af Amer: 86 mL/min — ABNORMAL LOW (ref >90)
Potassium: 4.2 mEq/L (ref 3.5–5.1)
Total Bilirubin: 0.2 mg/dL — ABNORMAL LOW (ref 0.3–1.2)

## 2013-05-07 LAB — CBC WITH DIFFERENTIAL/PLATELET
HCT: 41.7 % (ref 36.0–46.0)
Hemoglobin: 14.3 g/dL (ref 12.0–15.0)
Lymphocytes Relative: 20 % (ref 12–46)
Lymphs Abs: 1.5 10*3/uL (ref 0.7–4.0)
Monocytes Absolute: 0.5 10*3/uL (ref 0.1–1.0)
Monocytes Relative: 7 % (ref 3–12)
Neutro Abs: 5.2 10*3/uL (ref 1.7–7.7)
Neutrophils Relative %: 71 % (ref 43–77)
RBC: 4.25 MIL/uL (ref 3.87–5.11)
WBC: 7.4 10*3/uL (ref 4.0–10.5)

## 2013-05-07 LAB — URINALYSIS, ROUTINE W REFLEX MICROSCOPIC
Bilirubin Urine: NEGATIVE
Glucose, UA: NEGATIVE mg/dL
Ketones, ur: NEGATIVE mg/dL
Leukocytes, UA: NEGATIVE
Nitrite: NEGATIVE
Protein, ur: NEGATIVE mg/dL

## 2013-05-07 LAB — WET PREP, GENITAL
Clue Cells Wet Prep HPF POC: NONE SEEN
Yeast Wet Prep HPF POC: NONE SEEN

## 2013-05-07 LAB — LIPASE, BLOOD: Lipase: 106 U/L — ABNORMAL HIGH (ref 11–59)

## 2013-05-07 LAB — URINE MICROSCOPIC-ADD ON

## 2013-05-07 MED ORDER — SODIUM CHLORIDE 0.9 % IV BOLUS (SEPSIS)
1000.0000 mL | Freq: Once | INTRAVENOUS | Status: AC
  Administered 2013-05-07: 1000 mL via INTRAVENOUS

## 2013-05-07 MED ORDER — MORPHINE SULFATE 4 MG/ML IJ SOLN
4.0000 mg | Freq: Once | INTRAMUSCULAR | Status: AC
  Administered 2013-05-07: 4 mg via INTRAVENOUS
  Filled 2013-05-07: qty 1

## 2013-05-07 MED ORDER — MORPHINE SULFATE 4 MG/ML IJ SOLN: 4.0000 mg | Freq: Once | INTRAMUSCULAR | Status: DC

## 2013-05-07 MED ORDER — MORPHINE SULFATE 4 MG/ML IJ SOLN: 2.0000 mg | Freq: Once | INTRAMUSCULAR | Status: DC

## 2013-05-07 MED ORDER — ONDANSETRON HCL 4 MG/2ML IJ SOLN
4.0000 mg | Freq: Once | INTRAMUSCULAR | Status: AC
  Administered 2013-05-07: 4 mg via INTRAVENOUS
  Filled 2013-05-07: qty 2

## 2013-05-07 MED ORDER — IOHEXOL 300 MG/ML  SOLN
50.0000 mL | Freq: Once | INTRAMUSCULAR | Status: AC | PRN
  Administered 2013-05-07: 50 mL via ORAL

## 2013-05-07 MED ORDER — IOHEXOL 300 MG/ML  SOLN
100.0000 mL | Freq: Once | INTRAMUSCULAR | Status: AC | PRN
  Administered 2013-05-07: 100 mL via INTRAVENOUS

## 2013-05-07 NOTE — ED Notes (Signed)
PA at bedside.

## 2013-05-07 NOTE — ED Provider Notes (Signed)
CSN: [id number]     Arrival date & time 05/07/13  1658 History   First MD Initiated Contact with Patient 05/07/13 1703     Chief Complaint  Patient presents with  . Vaginal Bleeding  . Abdominal Pain  . Emesis    HPI  Holly Parrish is a 42 y.o. female with a PMH of breast cancer with chemo and radiation (last chemo 1 month ago), lupus, cervical cancer, asthma, migraines, arthritis, and peripheral neuropathy who presents to the ED for evaluation of vaginal bleeding, abdominal pain, and emesis.  History was provided by the patient.  Patient states she's had vaginal bleeding for the past 5 days. She states that she has not had vaginal bleeding since she started chemotherapy (07/2012). She states that her vaginal bleeding has increased in volume over the past few days. She states that she also has had small dime sized clots which have increased to large quarter (and larger) size clots. She also reports a foul vaginal odor and a green vaginal discharge. She denies any dysuria, vaginal itching, or vaginal pain. She is not currently sexually active (at least within the past year). She states she has had lower abdominal pain with radiation to her back intermittently over the past few days. She states that it feels like "I'm in labor."  He describes her pain as a cramping sensation. Movement makes her pain worse and lying flat makes her pain better. She has not taken anything for pain. She states she started vomiting last night. Her last episode of emesis was at 6 AM this morning. She's had approximately 5 episodes of emesis no hematemesis. She states she's been taking her Zofran but her last dose last night. Did not take any Zofran today, hours currently nauseated. She's having "dry heaves" currently.  She has not had anything to eat or drink today. She denies any diarrhea, constipation, or rectal bleeding. She states she is intermittently lightheaded while walking but is not currently lightheaded. She has a  history of multiple abdominal surgeries including a cholecystectomy, splenectomy, tubal ligation, appendectomy and abdominal surgical exploration. Patient states she also has a posterior headache which started last night. She states she has not had migraines in awhile however her headache is similar to her migraines in the past. She states she does not have however any photophobia which she usually has with her migraine headaches.  She is currently undergoing chemotherapy for breast cancer. Her last chemotherapy was on September 17. She previously underwent radiation. Her oncologist is Dr. Dayton Scrape. No fever, chills, rhinorrhea, congestion, cough, chest pain, shortness of breath, neck pain, back pain, leg pain/swelling, weakness, or loss of sensation.     Past Medical History  Diagnosis Date  . Lupus   . Joint pain   . Hearing loss   . Bilateral swelling of feet   . Breast cancer 40  . Cancer 37    Cervical, treated with cryoablation  . Asthma   . Neuromuscular disorder   . Migraines   . Headache(784.0)   . Arthritis   . Allergy   . Peripheral neuropathy     bilateral feet  . Hx of radiation therapy 01/25/13- 03/18/13    right chest wall/regional lymph nodes 5040 cGy 28 sessions, right mastectomy scar boost 1000 cGy 5 sessions   Past Surgical History  Procedure Laterality Date  . Tonsillectomy    . Cholecystectomy    . 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 Portacath placement  03/11/2012  Procedure: INSERTION PORT-A-CATH;  Surgeon: Emelia Loron, MD;  Location: [facility];  Service: General;  Laterality: Left;  . Hand surgery  1997  . Appendectomy  1995  . Abdominal exploration surgery  [id number]  . Tubal ligation  1997  . Mastectomy modified radical Right 10/06/2012    Procedure: MASTECTOMY MODIFIED RADICAL WITH AXILLARY CONTENT;  Surgeon: Emelia Loron, MD;  Location: [facility];  Service: General;  Laterality: Right;   Family History  Problem Relation Age of  Onset  . Uterine cancer Mother 62  . Pancreatic cancer Paternal Uncle     diagnosed in his 28s; smoker; half uncle related through grandmother  . Cancer Paternal Uncle     Throat  . Liver cancer Maternal Grandmother     not a drinker  . Cancer Maternal Grandmother   . Breast cancer Cousin     3 paternal cousins with breast cancer, onset 8, 60 and one bilateral at  32 and 85  . Cancer Cousin     Breast  . Heart attack Father 4  . Breast cancer Paternal Grandmother 71  . Heart attack Paternal Grandfather   . Throat cancer Paternal Aunt     smoker; half uncle related through grandmother  . Breast cancer Paternal Aunt     2 paternal aunts, both with 2 diagnoses of breast cancer each  . Cystic fibrosis Cousin   . Cancer Cousin     Breast  . Cancer Cousin     Breast  . Breast cancer Paternal Aunt    History  Substance Use Topics  . Smoking status: Never Smoker   . Smokeless tobacco: Never Used  . Alcohol Use: No     Comment: occasional drinker   OB History   Grav Para Term Preterm Abortions TAB SAB Ect Mult Living                 Review of Systems  Constitutional: Positive for appetite change. Negative for fever, chills, diaphoresis, activity change and fatigue.  HENT: Negative for congestion, mouth sores and sore throat.   Eyes: Negative for visual disturbance.  Respiratory: Negative for cough and shortness of breath.   Cardiovascular: Negative for chest pain and leg swelling.  Gastrointestinal: Positive for nausea, vomiting and abdominal pain. Negative for diarrhea, constipation, blood in stool, anal bleeding and rectal pain.  Genitourinary: Positive for vaginal bleeding, vaginal discharge, menstrual problem and pelvic pain. Negative for dysuria, frequency, hematuria, flank pain, difficulty urinating, genital sores and vaginal pain.  Musculoskeletal: Positive for back pain. Negative for arthralgias, gait problem, myalgias and neck pain.  Skin: Negative for color change  and wound.  Neurological: Positive for light-headedness and headaches. Negative for dizziness, syncope, weakness and numbness.    Allergies  Bee venom; Nsaids; Penicillins; Tetanus toxoids; Vitamin k and related; Hydrogen peroxide; Banana; Nyquil multi-symptom; Restoril; and Tramadol  Home Medications   Current Outpatient Rx  Name  Route  Sig  Dispense  Refill  . acetaminophen (TYLENOL) 500 MG tablet   Oral   Take 1,000 mg by mouth every 6 (six) hours as needed for pain.         Marland Kitchen EPINEPHrine (EPIPEN 2-PAK) 0.3 mg/0.3 mL DEVI   Intramuscular   Inject 0.3 mg into the muscle once. Bee venom         . gabapentin (NEURONTIN) 600 MG tablet   Oral   Take 1,200-1,500 mg by mouth daily. Patient states she takes 1200 mg daily but can increase  to 1500 if needed         . lidocaine-prilocaine (EMLA) cream   Topical   Apply topically as needed.   30 g   0   . ondansetron (ZOFRAN) 8 MG tablet   Oral   Take 1 tablet (8 mg total) by mouth every 12 (twelve) hours as needed for nausea.   20 tablet   0   . PRESCRIPTION MEDICATION   Intravenous   Inject into the vein every 30 (thirty) days. trastuzumab (HERCEPTIN) 546 mg in sodium chloride 0.9 % 250 mL chemo infusion   6 mg/kg, 552 mL/hr          BP 128/95  Pulse 85  Temp(Src) 98.4 F (36.9 C) (Oral)  Resp 20  SpO2 95%  LMP 05/03/2013  Filed Vitals:   05/07/13 1819 05/07/13 1933 05/07/13 2123 05/08/13 0002  BP: 111/75 99/58 108/66 113/75  Pulse: 76 66 69 82  Temp: 98.1 F (36.7 C) 98.1 F (36.7 C)    TempSrc:  Oral    Resp: 16 18 19 16   SpO2: 97% 100% 98% 97%     Physical Exam  Nursing note and vitals reviewed. Constitutional: She is oriented to person, place, and time. She appears well-developed and well-nourished. No distress.  HENT:  Head: Normocephalic and atraumatic.  Right Ear: External ear normal.  Left Ear: External ear normal.  Nose: Nose normal.  Mouth/Throat: Oropharynx is clear and moist. No  oropharyngeal exudate.  Eyes: Conjunctivae are normal. Pupils are equal, round, and reactive to light. Right eye exhibits no discharge. Left eye exhibits no discharge.  Neck: Normal range of motion. Neck supple.  Cardiovascular: Normal rate, regular rhythm, normal heart sounds and intact distal pulses.  Exam reveals no gallop and no friction rub.   No murmur heard. Dorsalis pedis pulses present bilaterally  Pulmonary/Chest: Effort normal and breath sounds normal. No respiratory distress. She has no wheezes. She has no rales. She exhibits no tenderness.  Abdominal: Soft. Bowel sounds are normal. She exhibits no distension and no mass. There is tenderness. There is no rebound and no guarding.  Tenderness to palpation to the lower abdomen diffusely.   Musculoskeletal: Normal range of motion. She exhibits no edema and no tenderness.  No pain to palpation to the lower lumbar spine diffusely or CVA tenderness.   Neurological: She is alert and oriented to person, place, and time.  Skin: Skin is warm and dry. She is not diaphoretic.    ED Course  Procedures (including critical care time) Labs Review Labs Reviewed - No data to display Imaging Review No results found.  EKG Interpretation   None      Results for orders placed during the hospital encounter of 05/07/13  WET PREP, GENITAL      Result Value Range   Yeast Wet Prep HPF POC NONE SEEN  NONE SEEN   Trich, Wet Prep NONE SEEN  NONE SEEN   Clue Cells Wet Prep HPF POC NONE SEEN  NONE SEEN   WBC, Wet Prep HPF POC FEW (*) NONE SEEN  CBC WITH DIFFERENTIAL      Result Value Range   WBC 7.4  4.0 - 10.5 K/uL   RBC 4.25  3.87 - 5.11 MIL/uL   Hemoglobin 14.3  12.0 - 15.0 g/dL   HCT 04.5  40.9 - 81.1 %   MCV 98.1  78.0 - 100.0 fL   MCH 33.6  26.0 - 34.0 pg   MCHC 34.3  30.0 -  36.0 g/dL   RDW 78.2  95.6 - 21.3 %   Platelets 556 (*) 150 - 400 K/uL   Neutrophils Relative % 71  43 - 77 %   Neutro Abs 5.2  1.7 - 7.7 K/uL   Lymphocytes  Relative 20  12 - 46 %   Lymphs Abs 1.5  0.7 - 4.0 K/uL   Monocytes Relative 7  3 - 12 %   Monocytes Absolute 0.5  0.1 - 1.0 K/uL   Eosinophils Relative 1  0 - 5 %   Eosinophils Absolute 0.1  0.0 - 0.7 K/uL   Basophils Relative 0  0 - 1 %   Basophils Absolute 0.0  0.0 - 0.1 K/uL  COMPREHENSIVE METABOLIC PANEL      Result Value Range   Sodium 139  135 - 145 mEq/L   Potassium 4.2  3.5 - 5.1 mEq/L   Chloride 104  96 - 112 mEq/L   CO2 27  19 - 32 mEq/L   Glucose, Bld 102 (*) 70 - 99 mg/dL   BUN 13  6 - 23 mg/dL   Creatinine, Ser 0.86  0.50 - 1.10 mg/dL   Calcium 9.1  8.4 - 57.8 mg/dL   Total Protein 7.2  6.0 - 8.3 g/dL   Albumin 3.3 (*) 3.5 - 5.2 g/dL   AST 18  0 - 37 U/L   ALT 15  0 - 35 U/L   Alkaline Phosphatase 122 (*) 39 - 117 U/L   Total Bilirubin 0.2 (*) 0.3 - 1.2 mg/dL   GFR calc non Af Amer 86 (*) >90 mL/min   GFR calc Af Amer >90  >90 mL/min  LIPASE, BLOOD      Result Value Range   Lipase 106 (*) 11 - 59 U/L  URINALYSIS, ROUTINE W REFLEX MICROSCOPIC      Result Value Range   Color, Urine YELLOW  YELLOW   APPearance CLOUDY (*) CLEAR   Specific Gravity, Urine 1.022  1.005 - 1.030   pH 5.5  5.0 - 8.0   Glucose, UA NEGATIVE  NEGATIVE mg/dL   Hgb urine dipstick LARGE (*) NEGATIVE   Bilirubin Urine NEGATIVE  NEGATIVE   Ketones, ur NEGATIVE  NEGATIVE mg/dL   Protein, ur NEGATIVE  NEGATIVE mg/dL   Urobilinogen, UA 0.2  0.0 - 1.0 mg/dL   Nitrite NEGATIVE  NEGATIVE   Leukocytes, UA NEGATIVE  NEGATIVE  PREGNANCY, URINE      Result Value Range   Preg Test, Ur NEGATIVE  NEGATIVE  URINE MICROSCOPIC-ADD ON      Result Value Range   Squamous Epithelial / LPF RARE  RARE   RBC / HPF TOO NUMEROUS TO COUNT  <3 RBC/hpf   CT Abdomen Pelvis W Contrast (Final result)  Result time: 05/07/13 21:42:56    Final result by Rad Results In Interface (05/07/13 21:42:56)    Narrative:   *RADIOLOGY REPORT*  Clinical Data: Abdominal pain and pelvic pain; excessive vaginal bleeding.  CT  ABDOMEN AND PELVIS WITH CONTRAST  Technique: Multidetector CT imaging of the abdomen and pelvis was performed following the standard protocol during bolus administration of intravenous contrast.  Contrast: 100 mL of Omnipaque 300 IV contrast  Comparison: PET/CT performed 03/12/2012  Findings: Mild focal opacity is noted at the anterior right lung base; this is nonspecific and may reflect atelectasis or possibly minimal infection. The patient is status post right-sided mastectomy.  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 The patient is status post cholecystectomy, with clips noted at the gallbladder fossa. The pancreas and adrenal glands are unremarkable.  The kidneys are unremarkable in appearance. There is no evidence of hydronephrosis. No renal or ureteral stones are seen. No perinephric stranding is appreciated. A small right-sided extrarenal pelvis is incidentally seen.  No free fluid is identified. The small bowel is unremarkable in appearance. The stomach is within normal limits. No acute vascular abnormalities are seen.  The patient is status post appendectomy. The colon is unremarkable in appearance.  The bladder is largely decompressed and grossly unremarkable in appearance. The uterus is within normal limits. The ovaries are relatively symmetric; a small left ovarian follicle is noted. No suspicious adnexal masses are seen. No inguinal lymphadenopathy is seen.  No acute osseous abnormalities are identified.  IMPRESSION:  1. No acute abnormality seen to explain the patient's symptoms. 2. Mild focal opacity at the right anterior lung base is nonspecific and may reflect atelectasis or possibly minimal pneumonia.   Original Report Authenticated By: Tonia Ghent, M.D.      MDM   1. Vaginal bleeding   2. Abdominal cramping, bilateral lower quadrant   3. Thrombocytosis      Holly Parrish is a 42 y.o. female with a PMH of breast cancer with chemo and radiation (last chemo 1 month ago), lupus, cervical cancer, asthma, migraines, arthritis,and  peripheral neuropathy who presents to the ED for evaluation of vaginal bleeding, abdominal pain, and emesis.  CBC, CMP, lipase, UA, urine pregnancy ordered.  1L normal saline. 4mg  morphine and zofran ordered.     Rechecks  7:30 PM = Patient states she feels much better.  Pain 5/10. Nausea resolved.  Pelvic exam performed at bedside with [facility] ED tech. She had minimal bright red blood in the vaginal vault.  No vaginal discharge seen. No active bleeding/hemorrhage. No foreign body. CMT and adnexal tenderness.  Wet mount and GC testing sent.  9:15 PM = Pain returning.  Will re-check BP and then order more medication.   10:25 PM = 4 mg morphine ordered.  Cannot take Percocet or Vicodin which induces nausea and vomiting.  She cannot take anything at home for pain due to a pain management contract.  Believes she will be ok to be discharged without pain medications if we can control her pain before discharge.  10:46 PM = Patient just received morphine.  No change in pain.  Will re-check.   11:15 PM = Patient resting comfortably.  Feels well enough for discharge.  No emesis.     Etiology of symptoms possibly due to menorrhagia.  Patient states she has not had a menstrual period since January. Her H&H was stable, she was afebrile, and she remained in no acute distress throughout her ED visit.  Her UA was not suggestive of a UTI.  Her wet mount was negative.  She had elevated lipase however had no complaints of epigastric or upper abdominal pain.  She had no episodes of emesis throughout her ED visit.  Her CT was negative for any acute intraabdominal pathology.  Patient had thrombocytosis which appears to be her baseline.  Her pain was controlled upon discharge.  She was not provided anything for pain because the patient states she cannot  receive any narcotics due to a pain management plan.  She was instructed to follow-up with her oncologist and OB/GYN as soon as possible for a visit.  She was instructed to return to the  ED if she experiences any dizziness, repeated vomiting, change/worsening abdominal pain, fever, or any other concerns.  She was instructed to drink plenty or fluids and rest.  She was in agreement with discharge and plan.     Final impressions: 1. Vaginal bleeding  2. Abdominal cramping  3. Thrombocytosis     Luiz Iron PA-C   This patient was discussed with Dr. Julio Sicks, PA-C 05/08/13 1105

## 2013-05-07 NOTE — ED Notes (Signed)
Pt is aware that a urine sample is needed.  

## 2013-05-07 NOTE — ED Notes (Signed)
Pt from home c/o vaginal bleeding since Tuesday. Pt has been on chemo for breast CA. Pt states she has been soaking tampons, pads every hour. Pt adds heavy bleeding is green with foul odor. Pt also states that she has HA x2 days with nausea that is not controlled with rx Zofran. Pt last chemo was 1 month ago. Pt is A&O and NAD.

## 2013-05-08 LAB — GC/CHLAMYDIA PROBE AMP
CT Probe RNA: NEGATIVE
GC Probe RNA: NEGATIVE

## 2013-05-08 NOTE — ED Provider Notes (Signed)
Medical screening examination/treatment/procedure(s) were performed by non-physician practitioner and as supervising physician I was immediately available for consultation/collaboration.    Nobuo Nunziata D Doran Nestle, MD 05/08/13 1534 

## 2013-05-10 ENCOUNTER — Other Ambulatory Visit: Payer: Self-pay | Admitting: Physician Assistant

## 2013-05-10 ENCOUNTER — Telehealth: Payer: Self-pay | Admitting: Oncology

## 2013-05-10 DIAGNOSIS — C50519 Malignant neoplasm of lower-outer quadrant of unspecified female breast: Secondary | ICD-10-CM

## 2013-05-10 NOTE — Telephone Encounter (Signed)
, °

## 2013-05-13 ENCOUNTER — Other Ambulatory Visit: Payer: Self-pay | Admitting: *Deleted

## 2013-05-13 NOTE — Progress Notes (Signed)
Message left by pt stating she has been trying to follow up for about 3-4 weeks about an appointment with relating to radiation- " so I am reaching out to anyone but no one is calling me back ".  Return call number left as [phone].  This RN called above and obtained identified VM by number. General message left stating appointment for CT is scheduled for 10/18 at 750pm at [facility] with follow up appt on 10/20 with AB at 1245pm.

## 2013-05-16 ENCOUNTER — Telehealth: Payer: Self-pay | Admitting: *Deleted

## 2013-05-16 ENCOUNTER — Encounter (INDEPENDENT_AMBULATORY_CARE_PROVIDER_SITE_OTHER): Payer: Self-pay

## 2013-05-16 ENCOUNTER — Other Ambulatory Visit (HOSPITAL_BASED_OUTPATIENT_CLINIC_OR_DEPARTMENT_OTHER): Payer: Medicare Other

## 2013-05-16 ENCOUNTER — Ambulatory Visit (HOSPITAL_BASED_OUTPATIENT_CLINIC_OR_DEPARTMENT_OTHER): Payer: Medicare Other | Admitting: Physician Assistant

## 2013-05-16 ENCOUNTER — Encounter: Payer: Self-pay | Admitting: Physician Assistant

## 2013-05-16 VITALS — BP 103/71 | HR 65 | Temp 98.0°F | Resp 20 | Ht 62.0 in | Wt 204.5 lb

## 2013-05-16 DIAGNOSIS — N921 Excessive and frequent menstruation with irregular cycle: Secondary | ICD-10-CM | POA: Insufficient documentation

## 2013-05-16 DIAGNOSIS — Z170 Estrogen receptor positive status [ER+]: Secondary | ICD-10-CM

## 2013-05-16 DIAGNOSIS — N920 Excessive and frequent menstruation with regular cycle: Secondary | ICD-10-CM

## 2013-05-16 DIAGNOSIS — C50511 Malignant neoplasm of lower-outer quadrant of right female breast: Secondary | ICD-10-CM

## 2013-05-16 DIAGNOSIS — G629 Polyneuropathy, unspecified: Secondary | ICD-10-CM

## 2013-05-16 DIAGNOSIS — C50519 Malignant neoplasm of lower-outer quadrant of unspecified female breast: Secondary | ICD-10-CM

## 2013-05-16 LAB — COMPREHENSIVE METABOLIC PANEL (CC13)
ALT: 15 U/L (ref 0–55)
Albumin: 3.2 g/dL — ABNORMAL LOW (ref 3.5–5.0)
Alkaline Phosphatase: 111 U/L (ref 40–150)
Anion Gap: 8 mEq/L (ref 3–11)
BUN: 14.7 mg/dL (ref 7.0–26.0)
Chloride: 108 mEq/L (ref 98–109)
Glucose: 94 mg/dl (ref 70–140)
Potassium: 4 mEq/L (ref 3.5–5.1)
Sodium: 141 mEq/L (ref 136–145)
Total Protein: 7 g/dL (ref 6.4–8.3)

## 2013-05-16 LAB — CBC WITH DIFFERENTIAL/PLATELET
BASO%: 1.4 % (ref 0.0–2.0)
Basophils Absolute: 0.1 10*3/uL (ref 0.0–0.1)
Eosinophils Absolute: 0.1 10*3/uL (ref 0.0–0.5)
HGB: 14.3 g/dL (ref 11.6–15.9)
LYMPH%: 32.1 % (ref 14.0–49.7)
MCV: 100.6 fL (ref 79.5–101.0)
MONO#: 0.5 10*3/uL (ref 0.1–0.9)
MONO%: 10.9 % (ref 0.0–14.0)
NEUT#: 2.6 10*3/uL (ref 1.5–6.5)
RBC: 4.19 10*6/uL (ref 3.70–5.45)
RDW: 14.6 % — ABNORMAL HIGH (ref 11.2–14.5)
WBC: 4.8 10*3/uL (ref 3.9–10.3)

## 2013-05-16 NOTE — Telephone Encounter (Signed)
Pt vm was full and not taking any message. Was not able to inform pt of her echo here @ [facility] on 05/18/13 @ 9am....td

## 2013-05-16 NOTE — Progress Notes (Signed)
ID: Holly Parrish   DOB: 28-Apr-1971  MR#: [id number]  GUY#:[id number]  PCP: No PCP Per Patient GYN: Antionette Char, MD SU: Emelia Loron, MD OTHER MD: Chipper Herb, MD;  Verdon Cummins, MD  CHIEF COMPLAINT:  Right Breast Cancer   HISTORY OF PRESENT ILLNESS: Holly Parrish noted a mass in her right breast early in 2013. She did not have any healthcare insurance and so delayed getting this looked after. She ultimately underwent a mammogram on 02/16/2012. This showed a mass in the right breast 8:00 position measuring 4 cm; it was firm and palpable. There was also a l 3 cm lower right axillary lymph node. Ultrasound confirmed the presence of the mass in the breast measuring 2.7 x 2.7 x 2.9 cm. There were 2 large right lower right axillary lymph nodes largest one measuring 4 cm a smaller one measuring 3.1 cm. She underwent biopsies of both these areas. 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 The tumor was also estrogen and progesterone receptor positive.  STAGE:  Cancer of lower-outer quadrant of female breast, right Primary site: Breast (Right)  Staging method: AJCC 7th Edition  Clinical: Stage IIB (T2, N1, cM0)  Summary: Stage IIB (T2, N1, cM0)  Remainder history as noted below.  INTERVAL HISTORY: Holly Parrish  returns today for followup of her right breast cancer. She completed her one year of trastuzumab in late August. She also completed her radiation therapy in August. She is here today to review her treatment plan and to discuss antiestrogen therapy.  Interval history is notable for Holly Parrish having been seen in the emergency room earlier this month on 05/07/2013 with complaints of heavy vaginal bleeding. This was her first menstrual cycle since beginning chemotherapy. Her workup in the emergency room was unremarkable, including a CT of the abdomen and pelvis. Fortunately, vaginal bleeding slowed down soon after her visit. She does mention that she is  still spotting, however, now more than one week later.  She was originally having severe cramping, but this has resolved.   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   REVIEW OF SYSTEMS: Holly Parrish has had no recent illnesses and denies any fevers or chills. She's had no rashes or skin changes and no signs of bruising or abnormal bleeding other than that discussed above.   Her energy level is slowly improving. She continues to have chronic pain, especially neuropathic pain, and this is followed by Dr. Manon Hilding.  She denies any new or unusual myalgias, arthralgias, or bony pain otherwise. She has occasional nausea but no emesis. She's having regular bowel movements.  She's had no increased cough, shortness of breath, chest pain, or palpitations. No abnormal headaches, dizziness or change in vision. She's had no peripheral swelling.  A detailed review of systems is otherwise stable and noncontributory.   PAST MEDICAL HISTORY: Past Medical History  Diagnosis Date  . Lupus   . Joint pain   . Hearing loss   . Bilateral swelling of feet   . Breast cancer 40  . Cancer 37    Cervical, treated with cryoablation  . Asthma   . Neuromuscular disorder   . Migraines   . Headache(784.0)   . Arthritis   . Allergy   . Peripheral neuropathy     bilateral feet  . Hx of radiation therapy 01/25/13- 03/18/13    right chest wall/regional lymph nodes 5040 cGy 28 sessions, right mastectomy scar  boost 1000 cGy 5 sessions    PAST SURGICAL HISTORY: Past Surgical History  Procedure Laterality Date  . Tonsillectomy    . Cholecystectomy    . 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 Portacath placement  03/11/2012    Procedure: INSERTION PORT-A-CATH;  Surgeon: Emelia Loron, MD;  Location: [facility];  Service: General;  Laterality: Left;  . Hand surgery  1997  . Appendectomy  1995  . Abdominal exploration surgery  [id number]  . Tubal ligation   1997  . Mastectomy modified radical Right 10/06/2012    Procedure: MASTECTOMY MODIFIED RADICAL WITH AXILLARY CONTENT;  Surgeon: Emelia Loron, MD;  Location: [facility];  Service: General;  Laterality: Right;    FAMILY HISTORY Family History  Problem Relation Age of Onset  . Uterine cancer Mother 74  . Pancreatic cancer Paternal Uncle     diagnosed in his 51s; smoker; half uncle related through grandmother  . Cancer Paternal Uncle     Throat  . Liver cancer Maternal Grandmother     not a drinker  . Cancer Maternal Grandmother   . Breast cancer Cousin     3 paternal cousins with breast cancer, onset 40, 65 and one bilateral at  66 and 37  . Cancer Cousin     Breast  . Heart attack Father 28  . Breast cancer Paternal Grandmother 42  . Heart attack Paternal Grandfather   . Throat cancer Paternal Aunt     smoker; half uncle related through grandmother  . Breast cancer Paternal Aunt     2 paternal aunts, both with 2 diagnoses of breast cancer each  . Cystic fibrosis Cousin   . Cancer Cousin     Breast  . Cancer Cousin     Breast  . Breast cancer Paternal Aunt     GYNECOLOGIC HISTORY: (updated May 24, 2013) G5 P5,  one daughter died at age 65; menarche at age 20; age of first live birth is 26; no recent history of birth control pill use;  having normal menses at the initiation of chemotherapy. Patient is status post tubal ligation.   SOCIAL HISTORY: (Updated 05-24-2013) From [place] where most of her family still resides.  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  She has been married twice and recently has been divorced.  She has 3 children who live in [place], one daughter who moved to this area.    ADVANCED DIRECTIVES:  HEALTH MAINTENANCE: (updated 2013-05-24) History  Substance Use Topics  . Smoking status: Never Smoker   . Smokeless tobacco: Never Used  . Alcohol Use: No     Comment: occasional drinker      Colonoscopy: Never  PAP:  Bone density: Never  Lipid panel:   Allergies  Allergen Reactions  . Bee Venom Anaphylaxis  . Nsaids Anaphylaxis  . Penicillins Anaphylaxis  . Tetanus Toxoids Anaphylaxis  . Vitamin K And Related Anaphylaxis  . Hydrogen Peroxide Other (See Comments)    "It burns until I bleed."  . Banana Other (See Comments)    Face swells  . Nyquil Multi-Symptom [Pseudoeph-Doxylamine-Dm-Apap]     "makes me crazy"  . Restoril [Temazepam] Other (See Comments)    nightmares  . Tramadol Other (See Comments)    tremors    Current Outpatient Prescriptions  Medication Sig Dispense Refill  . acetaminophen (TYLENOL) 500 MG tablet Take 1,000 mg by mouth every 6 (six) hours  as needed for pain.      Marland Kitchen EPINEPHrine (EPIPEN 2-PAK) 0.3 mg/0.3 mL DEVI Inject 0.3 mg into the muscle once. Bee venom      . gabapentin (NEURONTIN) 600 MG tablet Take 1,200-1,500 mg by mouth 3 (three) times daily.       Marland Kitchen lidocaine-prilocaine (EMLA) cream Apply topically as needed.  30 g  0  . ondansetron (ZOFRAN) 8 MG tablet Take 1 tablet (8 mg total) by mouth every 12 (twelve) hours as needed for nausea.  20 tablet  0   No current facility-administered medications for this visit.    OBJECTIVE: Young white female who appears slightly anxious, but is in no acute distress Filed Vitals:   05/16/13 1312  BP: 103/71  Pulse: 65  Temp: 98 F (36.7 C)  Resp: 20     Body mass index is 37.39 kg/(m^2).    ECOG FS: 1 Filed Weights   05/16/13 1312  Weight: 204 lb 8 oz (92.761 kg)   Physical Exam: HEENT:  Sclerae anicteric. Conjunctiva pink. Oropharynx clear. Buccal mucosa is pink NODES:  No cervical or supraclavicular lymphadenopathy palpated.  BREAST EXAM:  Patient status post right mastectomy, with no skin changes or evidence of local recurrence. Mild tenderness to palpation. Left breast is unremarkable. Axillae are benign bilaterally, no palpable lymphadenopathy LUNGS:  Clear to auscultation  bilaterally.  No wheezes or rhonchi HEART:  Regular rate and rhythm. No murmur  ABDOMEN:  Soft, obese, nontender.  Positive bowel sounds.  MSK:  No focal spinal tenderness to palpation. Full range of motion in the upper extremities EXTREMITIES:  No peripheral edema.   NEURO:  Nonfocal. Well oriented.  Appropriate affect.     LAB RESULTS: Lab Results  Component Value Date   WBC 4.8 05/16/2013   NEUTROABS 2.6 05/16/2013   HGB 14.3 05/16/2013   HCT 42.1 05/16/2013   MCV 100.6 05/16/2013   PLT 517* 05/16/2013      Chemistry      Component Value Date/Time   NA 141 05/16/2013 1156   NA 139 05/07/2013 1825   K 4.0 05/16/2013 1156   K 4.2 05/07/2013 1825   CL 104 05/07/2013 1825   CL 106 01/11/2013 1437   CO2 26 05/16/2013 1156   CO2 27 05/07/2013 1825   BUN 14.7 05/16/2013 1156   BUN 13 05/07/2013 1825   CREATININE 0.9 05/16/2013 1156   CREATININE 0.82 05/07/2013 1825      Component Value Date/Time   CALCIUM 9.2 05/16/2013 1156   CALCIUM 9.1 05/07/2013 1825   ALKPHOS 111 05/16/2013 1156   ALKPHOS 122* 05/07/2013 1825   AST 17 05/16/2013 1156   AST 18 05/07/2013 1825   ALT 15 05/16/2013 1156   ALT 15 05/07/2013 1825   BILITOT 0.29 05/16/2013 1156   BILITOT 0.2* 05/07/2013 1825        STUDIES: This recent echocardiogram on 11/16/2012 showed an ejection fraction of 60 - 65%.  and  Ct Abdomen Pelvis W Contrast  05/07/2013   *RADIOLOGY REPORT*  Clinical Data: Abdominal pain and pelvic pain; excessive vaginal bleeding.  CT ABDOMEN AND PELVIS WITH CONTRAST  Technique:  Multidetector CT imaging of the abdomen and pelvis was performed following the standard protocol during bolus administration of intravenous contrast.  Contrast: 100 mL of Omnipaque 300 IV contrast  Comparison: PET/CT performed 03/12/2012  Findings: Mild focal opacity is noted at the anterior right lung base; this is nonspecific and may reflect atelectasis or possibly minimal infection.  The patient is status  post right-sided mastectomy.  The liver is unremarkable in appearance; the spleen is absent, with a single splenule noted at the left upper quadrant.  The patient is status post cholecystectomy, with clips noted at the gallbladder fossa.  The pancreas and adrenal glands are unremarkable.  The kidneys are unremarkable in appearance.  There is no evidence of hydronephrosis.  No renal or ureteral stones are seen.  No perinephric stranding is appreciated.  A small right-sided extrarenal pelvis is incidentally seen.  No free fluid is identified.  The small bowel is unremarkable in appearance.  The stomach is within normal limits.  No acute vascular abnormalities are seen.  The patient is status post appendectomy.  The colon is unremarkable in appearance.  The bladder is largely decompressed and grossly unremarkable in appearance.  The uterus is within normal limits.  The ovaries are relatively symmetric; a small left ovarian follicle is noted.  No suspicious adnexal masses are seen.  No inguinal lymphadenopathy is seen.  No acute osseous abnormalities are identified.  IMPRESSION:  1.  No acute abnormality seen to explain the patient's symptoms. 2.  Mild focal opacity at the right anterior lung base is nonspecific and may reflect atelectasis or possibly minimal pneumonia.   Original Report Authenticated By: Tonia Ghent, M.D.     ASSESSMENT: 42 y.o.   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   (2) treated in the neoadjuvant setting.  Status post:  (a)  3 cycles of Taxotere/carbo with Herceptin, with poor tolerance  (b)  One cycle of weekly Carbo/Taxol and Herceptin in early November 2013, with poor tolerance  (c)  2 doses of weekly Carbo/Gemzar with Herceptin with poor tolerance and worsening neuropathy.  Last dose of chemo given in early  December 2013.  (3)  Herceptin was continued for a total of one year through August of 2014. Most recent echocardiogram on 11/16/2012 showed a well preserved ejection fraction of 60 - 65%.  (4)  status post right mastectomy 10/06/2012 with a residual 1.5 cm invasive ductal carcinoma, grade 2. Tumor was focally 0.1 cm from deep margins. 0 of 22 lymph nodes were involved. (ypT1c ypN0)  (5)   Received postmastectomy radiation, completed in August 2014  (6)  ready to initiate antiestrogen therapy, but delayed secondary to menorrhagia with irregular cycles.     PLAN: Over half of our 45 minute appointment today was spent answering Holly Parrish's questions, counseling her with regards to her concerns, discussing treatment options, and coordinating care.  This case was also reviewed with Dr. Darnelle Catalan.   Holly Parrish has completed her one-year treatment with trastuzumab. She is ready to have her port removed, and will be referred back to Dr. Dwain Sarna accordingly. We have ordered a final echocardiogram to assess stability of ejection fraction.  Holly Parrish is very concerned about the fact that a previous PET scan in August of 2013 noted subpectoral adenopathy. 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 We will obtain one final chest CT later this month to reevaluate these abnormalities, assess for any continued adenopathy, and assess for any evidence of disease recurrence.  With regards to her antiestrogen therapy, our plan had been to start [facility] on tamoxifen now that she has completed radiation  therapy. We are hesitant to do so, until her heavy vaginal bleeding is evaluated. She has not had a complete pelvic exam/Pap smear in some time, she tells me, and does not have an established oncologist here in [place].  Dr. Antionette Char has graciously agreed to see Holly Parrish this week for further evaluation, and an appointment has been  scheduled for Wednesday, October 22, at 1:30 PM.  In addition to evaluating Holly Parrish's menorrhagia, we would also like Dr. Tamela Oddi to discuss with [facility] the possibility of a bilateral salpingo-oophorectomy. Since tamoxifen does slightly increase the patient's risk of uterine cancer, if her ovaries were removed, we could start her on an aromatase inhibitor instead of tamoxifen and therefore remove this risk.  If that were the case, she would not necessarily need a total hysterectomy and could potentially keep her uterus. We will leave all of this up to Dr. Marcia Brash discretion upon further evaluation.  I will plan on seeing Holly Parrish back in approximately one month to catch up and hopefully we will be able to discuss antiestrogen therapy at that time. In the meanwhile, she'll continue to be followed by Dr. Manon Hilding on a regular basis for pain control.  All this was reviewed in detail with Holly Parrish, and she was given all of the above plan in writing as well. She is aware of her upcoming appointments, voices understanding and agreement with our plan, and will call with any changes or problems.     Holly Kopke PA-C  05/16/2013

## 2013-05-16 NOTE — Telephone Encounter (Signed)
appts made and printed.  Pt is aware that cs will call w/ appt for CT. Pt is aware that i gv echo order to [facility] for per cert and will call her with an appt...td

## 2013-05-18 ENCOUNTER — Telehealth: Payer: Self-pay | Admitting: Physician Assistant

## 2013-05-18 ENCOUNTER — Ambulatory Visit (HOSPITAL_COMMUNITY): Payer: Medicare Other | Attending: Oncology

## 2013-05-18 ENCOUNTER — Ambulatory Visit: Payer: Self-pay | Admitting: Obstetrics & Gynecology

## 2013-05-18 NOTE — Telephone Encounter (Signed)
per 1020 POF appt w Dr Dwain Sarna made by [facility] for 11/7 See Referral Notes Port removal will  be disc at appt shh

## 2013-05-24 ENCOUNTER — Ambulatory Visit (HOSPITAL_COMMUNITY)
Admission: RE | Admit: 2013-05-24 | Discharge: 2013-05-24 | Disposition: A | Discharge status: Home / Self Care | Payer: Medicare Other | Source: Ambulatory Visit | Attending: Physician Assistant | Admitting: Physician Assistant

## 2013-06-03 ENCOUNTER — Encounter (INDEPENDENT_AMBULATORY_CARE_PROVIDER_SITE_OTHER): Payer: Medicare Other | Admitting: General Surgery

## 2013-06-05 NOTE — Progress Notes (Signed)
Just wanted you to know that [name] did not keep her appointment with me on 10/22.  Antionette Char, MD

## 2013-06-06 ENCOUNTER — Ambulatory Visit (HOSPITAL_COMMUNITY): Payer: Medicare Other

## 2013-06-10 ENCOUNTER — Encounter (INDEPENDENT_AMBULATORY_CARE_PROVIDER_SITE_OTHER): Payer: Self-pay | Admitting: General Surgery

## 2013-06-15 ENCOUNTER — Ambulatory Visit: Payer: Medicare Other | Admitting: Physician Assistant

## 2013-06-15 ENCOUNTER — Other Ambulatory Visit: Payer: Medicare Other | Admitting: Lab

## 2013-06-15 ENCOUNTER — Encounter: Payer: Self-pay | Admitting: Physician Assistant

## 2013-06-16 ENCOUNTER — Telehealth: Payer: Self-pay | Admitting: Physician Assistant

## 2013-06-16 NOTE — Telephone Encounter (Signed)
Sent letter to patient from Amy Berry PA-C °

## 2013-06-23 ENCOUNTER — Encounter (HOSPITAL_COMMUNITY): Payer: Self-pay | Admitting: Emergency Medicine

## 2013-06-23 ENCOUNTER — Emergency Department (HOSPITAL_COMMUNITY)
Admission: EM | Admit: 2013-06-23 | Discharge: 2013-06-23 | Disposition: A | Discharge status: Home / Self Care | Payer: Medicare Other | Attending: Emergency Medicine | Admitting: Emergency Medicine

## 2013-06-23 DIAGNOSIS — Y9229 Other specified public building as the place of occurrence of the external cause: Secondary | ICD-10-CM | POA: Insufficient documentation

## 2013-06-23 DIAGNOSIS — Z8541 Personal history of malignant neoplasm of cervix uteri: Secondary | ICD-10-CM | POA: Insufficient documentation

## 2013-06-23 DIAGNOSIS — Z853 Personal history of malignant neoplasm of breast: Secondary | ICD-10-CM | POA: Insufficient documentation

## 2013-06-23 DIAGNOSIS — Z79899 Other long term (current) drug therapy: Secondary | ICD-10-CM | POA: Insufficient documentation

## 2013-06-23 DIAGNOSIS — H9190 Unspecified hearing loss, unspecified ear: Secondary | ICD-10-CM | POA: Insufficient documentation

## 2013-06-23 DIAGNOSIS — Z880 Allergy status to penicillin: Secondary | ICD-10-CM | POA: Insufficient documentation

## 2013-06-23 DIAGNOSIS — G43909 Migraine, unspecified, not intractable, without status migrainosus: Secondary | ICD-10-CM | POA: Insufficient documentation

## 2013-06-23 DIAGNOSIS — S61451A Open bite of right hand, initial encounter: Secondary | ICD-10-CM

## 2013-06-23 DIAGNOSIS — Y9389 Activity, other specified: Secondary | ICD-10-CM | POA: Insufficient documentation

## 2013-06-23 DIAGNOSIS — J45909 Unspecified asthma, uncomplicated: Secondary | ICD-10-CM | POA: Insufficient documentation

## 2013-06-23 DIAGNOSIS — IMO0001 Reserved for inherently not codable concepts without codable children: Secondary | ICD-10-CM | POA: Insufficient documentation

## 2013-06-23 DIAGNOSIS — Z8739 Personal history of other diseases of the musculoskeletal system and connective tissue: Secondary | ICD-10-CM | POA: Insufficient documentation

## 2013-06-23 DIAGNOSIS — Y990 Civilian activity done for income or pay: Secondary | ICD-10-CM | POA: Insufficient documentation

## 2013-06-23 DIAGNOSIS — S61409A Unspecified open wound of unspecified hand, initial encounter: Secondary | ICD-10-CM | POA: Insufficient documentation

## 2013-06-23 DIAGNOSIS — Z923 Personal history of irradiation: Secondary | ICD-10-CM | POA: Insufficient documentation

## 2013-06-23 MED ORDER — DOXYCYCLINE HYCLATE 100 MG PO TABS
100.0000 mg | ORAL_TABLET | Freq: Once | ORAL | Status: AC
  Administered 2013-06-23: 100 mg via ORAL
  Filled 2013-06-23: qty 1

## 2013-06-23 MED ORDER — DOXYCYCLINE HYCLATE 100 MG PO CAPS: 100.0000 mg | ORAL_CAPSULE | Freq: Two times a day (BID) | ORAL | Status: DC

## 2013-06-23 NOTE — ED Notes (Signed)
Pt has puncture wound to right index finger and palm of hand from a cat bite.

## 2013-06-23 NOTE — ED Provider Notes (Signed)
CSN: [id number]     Arrival date & time 06/23/13  0041 History   First MD Initiated Contact with Patient 06/23/13 0102     Chief Complaint  Patient presents with  . Animal Bite   HPI  History provided by the patient. Patient is a 42 year old female with history of breast cancer, lupus who presents with injuries after a cat bite. Patient works as a Statistician and was helping to position a sick cat for x-rays when it suddenly bit her right hand.  There was some bleeding which was controlled. Patient did immediately apply first aid with significant rinsing and washing. There has been associated swelling and pain with movements of the hand and fingers. Denies any decreased range of motion. No weakness or numbness. There patient's states the cat had been ill for several weeks. The cat belonged to a Environmental health practitioner and was reported to be an indoor only cat. The cat did not have any updated rabies vaccinations. Patient does state that the cat deceased and the head was sent for state testing of rabies. No other aggravating or alleviating factors. No other associated symptoms.    Past Medical History  Diagnosis Date  . Lupus   . Joint pain   . Hearing loss   . Bilateral swelling of feet   . Breast cancer 40  . Cancer 37    Cervical, treated with cryoablation  . Asthma   . Neuromuscular disorder   . Migraines   . Headache(784.0)   . Arthritis   . Allergy   . Peripheral neuropathy     bilateral feet  . Hx of radiation therapy 01/25/13- 03/18/13    right chest wall/regional lymph nodes 5040 cGy 28 sessions, right mastectomy scar boost 1000 cGy 5 sessions  . Breast cancer    Past Surgical History  Procedure Laterality Date  . Tonsillectomy    . Cholecystectomy    . 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 Portacath placement  03/11/2012    Procedure: INSERTION PORT-A-CATH;  Surgeon: Emelia Loron, MD;  Location: [facility];  Service: General;  Laterality: Left;   . Hand surgery  1997  . Appendectomy  1995  . Abdominal exploration surgery  [id number]  . Tubal ligation  1997  . Mastectomy modified radical Right 10/06/2012    Procedure: MASTECTOMY MODIFIED RADICAL WITH AXILLARY CONTENT;  Surgeon: Emelia Loron, MD;  Location: [facility];  Service: General;  Laterality: Right;   Family History  Problem Relation Age of Onset  . Uterine cancer Mother 19  . Pancreatic cancer Paternal Uncle     diagnosed in his 45s; smoker; half uncle related through grandmother  . Cancer Paternal Uncle     Throat  . Liver cancer Maternal Grandmother     not a drinker  . Cancer Maternal Grandmother   . Breast cancer Cousin     3 paternal cousins with breast cancer, onset 78, 45 and one bilateral at  38 and 40  . Cancer Cousin     Breast  . Heart attack Father 65  . Breast cancer Paternal Grandmother 69  . Heart attack Paternal Grandfather   . Throat cancer Paternal Aunt     smoker; half uncle related through grandmother  . Breast cancer Paternal Aunt     2 paternal aunts, both with 2 diagnoses of breast cancer each  . Cystic fibrosis Cousin   . Cancer Cousin     Breast  . Cancer  Cousin     Breast  . Breast cancer Paternal Aunt    History  Substance Use Topics  . Smoking status: Never Smoker   . Smokeless tobacco: Never Used  . Alcohol Use: No     Comment: occasional drinker   OB History   Grav Para Term Preterm Abortions TAB SAB Ect Mult Living                 Review of Systems  All other systems reviewed and are negative.    Allergies  Bee venom; Nsaids; Penicillins; Tetanus toxoids; Vitamin k and related; Hydrogen peroxide; Banana; Nyquil multi-symptom; Restoril; and Tramadol  Home Medications   Current Outpatient Rx  Name  Route  Sig  Dispense  Refill  . acetaminophen (TYLENOL) 500 MG tablet   Oral   Take 1,000 mg by mouth every 6 (six) hours as needed for pain.         Marland Kitchen EPINEPHrine (EPIPEN 2-PAK) 0.3 mg/0.3 mL DEVI    Intramuscular   Inject 0.3 mg into the muscle once. Bee venom         . gabapentin (NEURONTIN) 600 MG tablet   Oral   Take 1,200-1,500 mg by mouth 3 (three) times daily.          Marland Kitchen lidocaine-prilocaine (EMLA) cream   Topical   Apply topically as needed.   30 g   0   . ondansetron (ZOFRAN) 8 MG tablet   Oral   Take 1 tablet (8 mg total) by mouth every 12 (twelve) hours as needed for nausea.   20 tablet   0    BP 118/84  Pulse 91  Temp(Src) 98.1 F (36.7 C) (Oral)  Resp 18  SpO2 97%  LMP 06/22/2013 Physical Exam  Nursing note and vitals reviewed. Constitutional: She is oriented to person, place, and time. She appears well-developed and well-nourished. No distress.  HENT:  Head: Normocephalic.  Cardiovascular: Normal rate and regular rhythm.   Pulmonary/Chest: Effort normal and breath sounds normal. No respiratory distress. She has no wheezes. She has no rales.  Abdominal: Soft.  Musculoskeletal: Normal range of motion. She exhibits edema and tenderness.  There are puncture wounds consistent with history of cat bite to the right dorsal lateral second MCP joint area of the hand and to the proximal ulnar aspect of the hand.  Normal range of motion of the hand and fingers. Normal distal sensations and capillary refill.  Neurological: She is alert and oriented to person, place, and time.  Skin: Skin is warm and dry. No rash noted.  Psychiatric: She has a normal mood and affect. Her behavior is normal.    ED Course  Procedures    DIAGNOSTIC STUDIES: Oxygen Saturation is 97% on room air.    COORDINATION OF CARE:  Nursing notes reviewed. Vital signs reviewed. Initial pt interview and examination performed.   1:45 AM- Pt seen and evaluated. Patient well-appearing no acute distress. Rabies vaccinations or unknown for the sick cat which was reported to be an indoor only cath. I encouraged rabies asked [place] to the patient who this time preferred to wait. We discussed  the risks, alternatives and benefits of rabies vaccinations and patient expressed her understanding. She knows that she may return at anytime if she wishes to receive the rabies vaccination.   Patient has allergy to tetanus vaccination.     MDM   1. Cat bite of hand, right, initial encounter        Theron Arista  [place] Dragon, PA-C 06/23/13 2055

## 2013-06-23 NOTE — ED Provider Notes (Signed)
Medical screening examination/treatment/procedure(s) were performed by non-physician practitioner and as supervising physician I was immediately available for consultation/collaboration.  Shanna Cisco, MD 06/23/13 2133

## 2013-06-23 NOTE — ED Notes (Signed)
Pt. 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 The cat does not have an updated rabies vaccine and her rabies immunization has expired . Animal control has been notified prior to arrival .

## 2013-06-23 NOTE — ED Notes (Signed)
Name called no answer x 1 

## 2013-07-13 ENCOUNTER — Other Ambulatory Visit: Payer: Self-pay | Admitting: Physical Medicine and Rehabilitation

## 2013-07-13 DIAGNOSIS — M5416 Radiculopathy, lumbar region: Secondary | ICD-10-CM

## 2013-09-07 ENCOUNTER — Telehealth: Payer: Self-pay | Admitting: *Deleted

## 2013-09-07 NOTE — Telephone Encounter (Signed)
Pt called for an appt. gv appt for 09/16/13 w/ labs@ 8:45am and ov@ 9:15am.pt is aware.Marland Kitchentd

## 2013-09-16 ENCOUNTER — Encounter: Payer: Self-pay | Admitting: Physician Assistant

## 2013-09-16 ENCOUNTER — Ambulatory Visit: Payer: Medicare Other | Admitting: Physician Assistant

## 2013-09-16 ENCOUNTER — Other Ambulatory Visit: Payer: Medicare Other

## 2013-09-16 NOTE — Progress Notes (Unsigned)
[facility] today.  Letter mailed to patient.  Micah Flesher, PA-C 09/16/2013

## 2013-09-19 ENCOUNTER — Telehealth: Payer: Self-pay | Admitting: Oncology

## 2013-09-19 NOTE — Telephone Encounter (Signed)
Sent letter to patient from Dr. Magrinat. °

## 2013-10-04 ENCOUNTER — Telehealth: Payer: Self-pay | Admitting: *Deleted

## 2013-10-04 NOTE — Telephone Encounter (Signed)
Returned pt call. Pt wanted to rs her missed appt on 09/16/13. gv pt appt for 10/19/13 w/ labs@ 1:15pm and ov@ 1:45pm. Pt was not happy with the appt that i had gave her in which was the next available. I ask the pt if she would like for me to which her to the desk nurse to try and get an appt sooner. Pt began to yell ion my ear and say unpleasant things then hug the phone up in my face....td

## 2013-10-19 ENCOUNTER — Ambulatory Visit (HOSPITAL_BASED_OUTPATIENT_CLINIC_OR_DEPARTMENT_OTHER): Payer: Medicare Other | Admitting: Physician Assistant

## 2013-10-19 ENCOUNTER — Encounter: Payer: Self-pay | Admitting: Physician Assistant

## 2013-10-19 ENCOUNTER — Other Ambulatory Visit (HOSPITAL_BASED_OUTPATIENT_CLINIC_OR_DEPARTMENT_OTHER): Payer: Medicare Other

## 2013-10-19 VITALS — BP 118/79 | HR 60 | Temp 97.8°F | Resp 18 | Ht 62.0 in | Wt 214.2 lb

## 2013-10-19 DIAGNOSIS — Z170 Estrogen receptor positive status [ER+]: Secondary | ICD-10-CM

## 2013-10-19 DIAGNOSIS — R918 Other nonspecific abnormal finding of lung field: Secondary | ICD-10-CM

## 2013-10-19 DIAGNOSIS — Z853 Personal history of malignant neoplasm of breast: Secondary | ICD-10-CM

## 2013-10-19 DIAGNOSIS — M329 Systemic lupus erythematosus, unspecified: Secondary | ICD-10-CM

## 2013-10-19 DIAGNOSIS — F329 Major depressive disorder, single episode, unspecified: Secondary | ICD-10-CM

## 2013-10-19 DIAGNOSIS — C50519 Malignant neoplasm of lower-outer quadrant of unspecified female breast: Secondary | ICD-10-CM

## 2013-10-19 DIAGNOSIS — C50511 Malignant neoplasm of lower-outer quadrant of right female breast: Secondary | ICD-10-CM

## 2013-10-19 DIAGNOSIS — N644 Mastodynia: Secondary | ICD-10-CM

## 2013-10-19 DIAGNOSIS — G629 Polyneuropathy, unspecified: Secondary | ICD-10-CM

## 2013-10-19 DIAGNOSIS — F32A Depression, unspecified: Secondary | ICD-10-CM

## 2013-10-19 LAB — CBC WITH DIFFERENTIAL/PLATELET
BASO%: 1.2 % (ref 0.0–2.0)
BASOS ABS: 0.1 10*3/uL (ref 0.0–0.1)
EOS%: 2.5 % (ref 0.0–7.0)
Eosinophils Absolute: 0.1 10*3/uL (ref 0.0–0.5)
HCT: 41.7 % (ref 34.8–46.6)
HEMOGLOBIN: 13.8 g/dL (ref 11.6–15.9)
LYMPH#: 3 10*3/uL (ref 0.9–3.3)
LYMPH%: 52.2 % — ABNORMAL HIGH (ref 14.0–49.7)
MCH: 33.5 pg (ref 25.1–34.0)
MCHC: 33.1 g/dL (ref 31.5–36.0)
MCV: 101.3 fL — ABNORMAL HIGH (ref 79.5–101.0)
MONO#: 0.7 10*3/uL (ref 0.1–0.9)
MONO%: 12.7 % (ref 0.0–14.0)
NEUT%: 31.4 % — ABNORMAL LOW (ref 38.4–76.8)
NEUTROS ABS: 1.8 10*3/uL (ref 1.5–6.5)
Platelets: 405 10*3/uL — ABNORMAL HIGH (ref 145–400)
RBC: 4.12 10*6/uL (ref 3.70–5.45)
RDW: 13.5 % (ref 11.2–14.5)
WBC: 5.8 10*3/uL (ref 3.9–10.3)

## 2013-10-19 LAB — COMPREHENSIVE METABOLIC PANEL (CC13)
ALBUMIN: 3.5 g/dL (ref 3.5–5.0)
ALT: 10 U/L (ref 0–55)
ANION GAP: 13 meq/L — AB (ref 3–11)
AST: 13 U/L (ref 5–34)
Alkaline Phosphatase: 101 U/L (ref 40–150)
BUN: 24.4 mg/dL (ref 7.0–26.0)
CALCIUM: 9.4 mg/dL (ref 8.4–10.4)
CHLORIDE: 108 meq/L (ref 98–109)
CO2: 25 meq/L (ref 22–29)
Creatinine: 1 mg/dL (ref 0.6–1.1)
GLUCOSE: 100 mg/dL (ref 70–140)
POTASSIUM: 4 meq/L (ref 3.5–5.1)
Sodium: 145 mEq/L (ref 136–145)
Total Bilirubin: 0.24 mg/dL (ref 0.20–1.20)
Total Protein: 6.8 g/dL (ref 6.4–8.3)

## 2013-10-19 NOTE — Progress Notes (Signed)
ID: Holly Parrish   DOB: Jun 03, 1971  MR#: [id number]  ZYS#:[id number]  PCP: No PCP Per Patient GYN: Lahoma Crocker, MD SU: Rolm Bookbinder, MD OTHER MD: Arloa Koh, MD;  Margaretha Sheffield, MD  CHIEF COMPLAINT:  Right Breast Cancer   HISTORY OF PRESENT ILLNESS: Holly Parrish noted a mass in her right breast early in 2013. She did not have any healthcare insurance and so delayed getting this looked after. She ultimately underwent a mammogram on 02/16/2012. This showed a mass in the right breast 8:00 position measuring 4 cm; it was firm and palpable. There was also a l 3 cm lower right axillary lymph node. Ultrasound confirmed the presence of the mass in the breast measuring 2.7 x 2.7 x 2.9 cm. There were 2 large right lower right axillary lymph nodes largest one measuring 4 cm a smaller one measuring 3.1 cm. She underwent biopsies of both these areas. 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 The tumor was also estrogen and progesterone receptor positive.  STAGE:  Cancer of lower-outer quadrant of female breast, right Primary site: Breast (Right)  Staging method: AJCC 7th Edition  Clinical: Stage IIB (T2, N1, cM0)  Summary: Stage IIB (T2, N1, cM0)  Remainder history as noted below.  INTERVAL HISTORY: Holly Parrish returns alone today for followup of her right breast cancer. She completed her one year of trastuzumab in late August. She also completed her radiation therapy in August.  She was last seen here in late October at which time we were planning to discuss antiestrogen therapy. We decided to delay this, however, because she had just been seen in the emergency room a week earlier with very heavy vaginal bleeding. Up until that time she had had no menstrual cycles since beginning chemotherapy, so this will certainly of concern.  Holly Parrish was referred to Dr. Delsa Sale a couple of days after her appointment here, but for some reason was unable to keep  that appointment. She does tell me she was evaluated by her own gynecologist at [facility], and that "everything was okay", but she can't remember the name of that physician. I do not have any of these records in her medical chart at this time. She does tell me, however, that she is back to having monthly cycles and that they are "usually normal".. Her last menstrual cycle was 09/08/2013.  Following her appointment here on 05/16/2013, [name] was also scheduled for a chest CT to followup on some abnormalities noted on previous scans. She was scheduled for a final echocardiogram as a new baseline at the completion of trastuzumab. She is also scheduled to see Dr. Donne Hazel to have her port removed. For multiple reasons, she was unable to keep most of these appointments. She does tell me she had a chest CT, but I have no record of that appointment being, and also have no report of the imaging study. (I have asked her to try to obtain this through radiology, and certainly if she can bring me a written report for a CT of the images we can avoid re\re scheduling that CT.)  Holly Parrish is still having a difficult time overall. She recently lost her job she tells me, and she has no insurance. She does continue see Dr. Greta Doom for chronic pain.   REVIEW OF SYSTEMS: [name] denies any recent illnesses and has had no fevers, chills, night sweats, or hot flashes. She's had no rashes or skin changes and denies any abnormal bleeding. Her energy  level is fair. Her appetite is good and she denies any nausea, emesis, or change in bowel or bladder habits. She's had no cough, increased shortness of breath, chest pain, or palpitations. She continues to have headaches which are "normal for her" and are stable. She denies any change in vision and has had no dizziness. She feels a little forgetful at times. She continues to have peripheral neuropathy which causes pain in the extremities. She continues to have chronic joint pain and muscle  pain as noted above. She's also started having some pain in the left breast, primarily around the nipple itself. She's noted no discharge or drainage from the nipple. She is due for her left mammogram.  A detailed review of systems is otherwise stable and noncontributory.     PAST MEDICAL HISTORY: Past Medical History  Diagnosis Date  . Lupus   . Joint pain   . Hearing loss   . Bilateral swelling of feet   . Breast cancer 40  . Cancer 37    Cervical, treated with cryoablation  . Asthma   . Neuromuscular disorder   . Migraines   . Headache(784.0)   . Arthritis   . Allergy   . Peripheral neuropathy     bilateral feet  . Hx of radiation therapy 01/25/13- 03/18/13    right chest wall/regional lymph nodes 5040 cGy 28 sessions, right mastectomy scar boost 1000 cGy 5 sessions  . Breast cancer     PAST SURGICAL HISTORY: Past Surgical History  Procedure Laterality Date  . Tonsillectomy    . Cholecystectomy    . 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 Portacath placement  03/11/2012    Procedure: INSERTION PORT-A-CATH;  Surgeon: Rolm Bookbinder, MD;  Location: [place];  Service: General;  Laterality: Left;  . Hand surgery  1997  . Appendectomy  1995  . Abdominal exploration surgery  [id number]  . Tubal ligation  1997  . Mastectomy modified radical Right 10/06/2012    Procedure: MASTECTOMY MODIFIED RADICAL WITH AXILLARY CONTENT;  Surgeon: Rolm Bookbinder, MD;  Location: [facility];  Service: General;  Laterality: Right;    FAMILY HISTORY Family History  Problem Relation Age of Onset  . Uterine cancer Mother 2  . Pancreatic cancer Paternal Uncle     diagnosed in his 58s; smoker; half uncle related through grandmother  . Cancer Paternal Uncle     Throat  . Liver cancer Maternal Grandmother     not a drinker  . Cancer Maternal Grandmother   . Breast cancer Cousin     3 paternal cousins with breast cancer, onset 52, 21 and one bilateral at  30 and 24  . Cancer  Cousin     Breast  . Heart attack Father 32  . Breast cancer Paternal Grandmother 68  . Heart attack Paternal Grandfather   . Throat cancer Paternal Aunt     smoker; half uncle related through grandmother  . Breast cancer Paternal Aunt     2 paternal aunts, both with 2 diagnoses of breast cancer each  . Cystic fibrosis Cousin   . Cancer Cousin     Breast  . Cancer Cousin     Breast  . Breast cancer Paternal Aunt     GYNECOLOGIC HISTORY: (updated Oct 21, 2013) G5 P5,  one daughter died at age 9; menarche at age 33; age of first live birth is 25; no recent history of birth control pill use;  having normal menses at the  initiation of chemotherapy. LMP 09/08/2013. Patient is status post tubal ligation.   SOCIAL HISTORY: (Updated 10/19/2013) From [place] where most of her family still resides.  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  She has been married twice and recently has been divorced.  She has 3 children who live in [place], one daughter who moved to this area.    ADVANCED DIRECTIVES:  HEALTH MAINTENANCE: (updated 10/19/2013) History  Substance Use Topics  . Smoking status: Never Smoker   . Smokeless tobacco: Never Used  . Alcohol Use: No     Comment: occasional drinker     Colonoscopy: Never  PAP:  Not on file  Bone density: Never  Lipid panel: Not on file   Allergies  Allergen Reactions  . Bee Venom Anaphylaxis  . Hydrogen Peroxide Other (See Comments)    "It burns until I bleed."  . Nsaids Anaphylaxis  . Penicillins Anaphylaxis  . Tetanus Toxoids Anaphylaxis  . Vitamin K And Related Anaphylaxis  . Banana Other (See Comments)    Face swells  . Nyquil Multi-Symptom [Pseudoeph-Doxylamine-Dm-Apap]     "makes me crazy"  . Restoril [Temazepam] Other (See Comments)    nightmares  . Tramadol Other (See Comments)    tremors    Current Outpatient Prescriptions  Medication Sig Dispense Refill  . acetaminophen (TYLENOL) 500 MG tablet Take  1,000 mg by mouth every 6 (six) hours as needed for pain.      Marland Kitchen gabapentin (NEURONTIN) 600 MG tablet Take 1,800 mg by mouth 3 (three) times daily.       Marland Kitchen EPINEPHrine (EPIPEN 2-PAK) 0.3 mg/0.3 mL DEVI Inject 0.3 mg into the muscle once. Bee venom      . ondansetron (ZOFRAN) 8 MG tablet Take 1 tablet (8 mg total) by mouth every 12 (twelve) hours as needed for nausea.  20 tablet  0  . PRESCRIPTION MEDICATION Inject 1 application into the vein once a week.       No current facility-administered medications for this visit.    OBJECTIVE: Young white female who appears slightly anxious, but is in no acute distress Filed Vitals:   10/19/13 1357  BP: 118/79  Pulse: 60  Temp: 97.8 F (36.6 C)  Resp: 18     Body mass index is 39.17 kg/(m^2).    ECOG FS: 1 Filed Weights   10/19/13 1357  Weight: 214 lb 3.2 oz (97.16 kg)   Physical Exam: HEENT:  Sclerae anicteric.  Oropharynx clear and moist neck supple, trachea midline. No thyromegaly..  NODES:  No cervical or supraclavicular lymphadenopathy palpated.  BREAST EXAM:  Patient status post right mastectomy. There is tenderness to palpation of the right chest wall but no suspicious nodularities or skin changes, no evidence of local recurrence. The left breast is extremely tender around the areolar complex and nipple itself. Unable to elicit any discharge from the nipple. There no suspicious nodularities or skin changes noted. Axillae are benign bilaterally for palpable lymphadenopathy. LUNGS:  Clear to auscultation bilaterally.  No wheezes or rhonchi HEART:  Regular rate and rhythm. No murmur appreciated ABDOMEN:  Soft, obese, nontender.  Positive bowel sounds.  MSK:  No focal spinal tenderness to palpation. Limited range of motion in the right upper extremity. EXTREMITIES:  No peripheral edema.  Mild lymphedema noted in the right upper extremity, nonpitting. SKIN:  Benign with no visible rashes or skin lesions. No excessive ecchymoses. No petechiae.  No pallor. NEURO:  Nonfocal. Well oriented.  Anxious affect.  LAB RESULTS: Lab Results  Component Value Date   WBC 5.8 10/19/2013   NEUTROABS 1.8 10/19/2013   HGB 13.8 10/19/2013   HCT 41.7 10/19/2013   MCV 101.3* 10/19/2013   PLT 405* 10/19/2013      Chemistry      Component Value Date/Time   NA 145 10/19/2013 1107   NA 139 05/07/2013 1825   K 4.0 10/19/2013 1107   K 4.2 05/07/2013 1825   CL 104 05/07/2013 1825   CL 106 01/11/2013 1437   CO2 25 10/19/2013 1107   CO2 27 05/07/2013 1825   BUN 24.4 10/19/2013 1107   BUN 13 05/07/2013 1825   CREATININE 1.0 10/19/2013 1107   CREATININE 0.82 05/07/2013 1825      Component Value Date/Time   CALCIUM 9.4 10/19/2013 1107   CALCIUM 9.1 05/07/2013 1825   ALKPHOS 101 10/19/2013 1107   ALKPHOS 122* 05/07/2013 1825   AST 13 10/19/2013 1107   AST 18 05/07/2013 1825   ALT 10 10/19/2013 1107   ALT 15 05/07/2013 1825   BILITOT 0.24 10/19/2013 1107   BILITOT 0.2* 05/07/2013 1825        STUDIES: This recent echocardiogram on 11/16/2012 showed an ejection fraction of 60 - 65%.     ASSESSMENT: 43 y.o.   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   (2) treated in the neoadjuvant setting.  Status post:  (a)  3 cycles of Taxotere/carbo with Herceptin, with poor tolerance  (b)  One cycle of weekly Carbo/Taxol and Herceptin in early November 2013, with poor tolerance  (c)  2 doses of weekly Carbo/Gemzar with Herceptin with poor tolerance and worsening neuropathy.  Last dose of chemo given in early December 2013.  (3)  Herceptin was continued for a total of one year through August of 2014. Most recent echocardiogram on 11/16/2012 showed a well preserved ejection fraction of 60 - 65%.  (4)  status post right mastectomy 10/06/2012 with a residual 1.5 cm invasive ductal carcinoma,  grade 2. Tumor was focally 0.1 cm from deep margins. 0 of 22 lymph nodes were involved. (ypT1c ypN0)  (5)   Received postmastectomy radiation, completed in August 2014  (6)  ready to initiate antiestrogen therapy, but delayed secondary to menorrhagia with irregular cycles.     PLAN: Over half of our 45 minute appointment today was spent  counseling Lillyanne with regards to her concerns, reviewing past imaging and lab results, discussing treatment options, and coordinating care.   We will try to reschedule all of the appointments that she was unable to keep over the past couple of months. She does need to be seen by her surgeon, Dr. Iris Pert for port removal. We will try to reschedule her echocardiogram as well as her chest CT. She has requested a consult with Dr. Harlow Mares to discuss reconstructive surgery of the right breast. She is also due for her left mammogram, and I will request a diagnostic mammogram and status screening mammogram since she is having increased pain in the left breast.  We will try to get all of the above completed over the next few weeks, and will plan seeing her back in late April to review all of the above results. We would like to consider starting her on antiestrogen therapy. We will likely avoid tamoxifen since she has a history of lupus and  a history of TIAs per her report. However, she is still having regular periods, so she will need injections of goserelin on a every 3 month basis   if taking an aromatase inhibitor. She is interested in having her ovaries removed, but we'll need these injections regularly until that surgery takes place.  She scheduled to return here on April 29 for repeat labs (including a pregnancy test) and repeat physical exam. At that time, we hope to have all of the above studies completed, and we hope to be able to discuss her antiestrogen therapy more thoroughly. I have asked her to bring me or fax me the office notes from her visit with a  gynecologist in [facility].  Dawnette has completed her one-year treatment with trastuzumab. She is ready to have her port removed, and will be referred back to Dr. Donne Hazel accordingly. We have ordered a final echocardiogram to assess stability of ejection fraction.  Flara is very concerned about the fact that a previous PET scan in August of 2013 noted subpectoral adenopathy. 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 We will obtain one final chest CT later this month to reevaluate these abnormalities, assess for any continued adenopathy, and assess for any evidence of disease recurrence.  Malya was given all the above information in writing, and she voices that her understanding and agreement with our plan today. She will call with any changes or problems, but otherwise will return in late April as scheduled.     Aidaly Cordner PA-C  10/19/2013

## 2013-10-20 ENCOUNTER — Telehealth (INDEPENDENT_AMBULATORY_CARE_PROVIDER_SITE_OTHER): Payer: Self-pay | Admitting: *Deleted

## 2013-10-20 ENCOUNTER — Telehealth: Payer: Self-pay | Admitting: Oncology

## 2013-10-20 NOTE — Telephone Encounter (Signed)
, °

## 2013-10-20 NOTE — Telephone Encounter (Signed)
Ann with Dr. Virgie Dad office called to find out about orders being placed for patient to have their PAC removed.  Explained that I would send a message to make Dr. Donne Hazel aware then once he has reviewed then the orders will be placed.

## 2013-10-20 NOTE — Telephone Encounter (Signed)
She should come see me in office before this but i can schedule her.  Need to know whether she wants just local or to be sedated

## 2013-10-24 NOTE — Telephone Encounter (Signed)
Called pt to make an appt with Dr Donne Hazel. Appt made for 11/11/13 arrive at 10:15/10:30.

## 2013-10-25 ENCOUNTER — Ambulatory Visit (HOSPITAL_COMMUNITY)
Admission: RE | Admit: 2013-10-25 | Discharge: 2013-10-25 | Disposition: A | Discharge status: Home / Self Care | Payer: Medicare Other | Source: Ambulatory Visit | Attending: Physician Assistant | Admitting: Physician Assistant

## 2013-10-25 ENCOUNTER — Ambulatory Visit (HOSPITAL_COMMUNITY)
Admission: RE | Admit: 2013-10-25 | Discharge: 2013-10-25 | Disposition: A | Discharge status: Home / Self Care | Payer: Medicare Other | Source: Ambulatory Visit | Attending: Oncology | Admitting: Oncology

## 2013-10-25 DIAGNOSIS — Z09 Encounter for follow-up examination after completed treatment for conditions other than malignant neoplasm: Secondary | ICD-10-CM

## 2013-10-25 DIAGNOSIS — Z9221 Personal history of antineoplastic chemotherapy: Secondary | ICD-10-CM | POA: Insufficient documentation

## 2013-10-25 DIAGNOSIS — Z9010 Acquired absence of unspecified breast and nipple: Secondary | ICD-10-CM | POA: Insufficient documentation

## 2013-10-25 DIAGNOSIS — Z853 Personal history of malignant neoplasm of breast: Secondary | ICD-10-CM

## 2013-10-25 DIAGNOSIS — C50919 Malignant neoplasm of unspecified site of unspecified female breast: Secondary | ICD-10-CM | POA: Insufficient documentation

## 2013-10-25 DIAGNOSIS — R918 Other nonspecific abnormal finding of lung field: Secondary | ICD-10-CM

## 2013-10-25 DIAGNOSIS — Z923 Personal history of irradiation: Secondary | ICD-10-CM | POA: Insufficient documentation

## 2013-10-25 MED ORDER — IOHEXOL 300 MG/ML  SOLN
80.0000 mL | Freq: Once | INTRAMUSCULAR | Status: AC | PRN
  Administered 2013-10-25: 80 mL via INTRAVENOUS

## 2013-11-11 ENCOUNTER — Encounter (INDEPENDENT_AMBULATORY_CARE_PROVIDER_SITE_OTHER): Payer: Medicare Other | Admitting: General Surgery

## 2013-11-21 ENCOUNTER — Other Ambulatory Visit: Payer: Self-pay | Admitting: *Deleted

## 2013-11-21 ENCOUNTER — Telehealth: Payer: Self-pay | Admitting: Oncology

## 2013-11-21 NOTE — Telephone Encounter (Signed)
pt called and wants to moved appt with Amy B, ML first opening is end of August 2015 , called amy nurse left message, pt was frustrated and will  move care to [facility]

## 2013-11-23 ENCOUNTER — Telehealth: Payer: Self-pay | Admitting: *Deleted

## 2013-11-23 ENCOUNTER — Ambulatory Visit: Payer: Medicare Other | Admitting: Physician Assistant

## 2013-11-23 ENCOUNTER — Other Ambulatory Visit: Payer: Medicare Other

## 2013-11-23 NOTE — Telephone Encounter (Signed)
Called pt to confirm appt for 12/23/2013. Pt apologized for not coming to last appt, but could not make the appt and pt said she called scheduling to let them know and was waiting on a call from scheduler but never received the call to r/s. Pt will be here on 12/23/2013 @10 :45. Message to be forwarded to [employer], PA-C.

## 2013-11-24 ENCOUNTER — Telehealth: Payer: Self-pay | Admitting: Physician Assistant

## 2013-11-24 NOTE — Telephone Encounter (Signed)
, °

## 2013-11-25 ENCOUNTER — Encounter (INDEPENDENT_AMBULATORY_CARE_PROVIDER_SITE_OTHER): Payer: Medicare Other | Admitting: General Surgery

## 2013-11-28 ENCOUNTER — Encounter (INDEPENDENT_AMBULATORY_CARE_PROVIDER_SITE_OTHER): Payer: Self-pay | Admitting: General Surgery

## 2013-12-23 ENCOUNTER — Other Ambulatory Visit (HOSPITAL_BASED_OUTPATIENT_CLINIC_OR_DEPARTMENT_OTHER): Payer: Medicare Other

## 2013-12-23 ENCOUNTER — Encounter: Payer: Self-pay | Admitting: Physician Assistant

## 2013-12-23 ENCOUNTER — Ambulatory Visit (HOSPITAL_COMMUNITY)
Admission: RE | Admit: 2013-12-23 | Discharge: 2013-12-23 | Disposition: A | Discharge status: Home / Self Care | Payer: Medicare Other | Source: Ambulatory Visit | Attending: Physician Assistant | Admitting: Physician Assistant

## 2013-12-23 ENCOUNTER — Ambulatory Visit (HOSPITAL_BASED_OUTPATIENT_CLINIC_OR_DEPARTMENT_OTHER): Payer: Medicare Other | Admitting: Physician Assistant

## 2013-12-23 ENCOUNTER — Telehealth: Payer: Self-pay | Admitting: *Deleted

## 2013-12-23 VITALS — BP 113/79 | HR 86 | Temp 98.4°F | Resp 18 | Ht 62.0 in | Wt 212.2 lb

## 2013-12-23 DIAGNOSIS — E2839 Other primary ovarian failure: Secondary | ICD-10-CM | POA: Insufficient documentation

## 2013-12-23 DIAGNOSIS — R059 Cough, unspecified: Secondary | ICD-10-CM | POA: Insufficient documentation

## 2013-12-23 DIAGNOSIS — J069 Acute upper respiratory infection, unspecified: Secondary | ICD-10-CM

## 2013-12-23 DIAGNOSIS — Z853 Personal history of malignant neoplasm of breast: Secondary | ICD-10-CM

## 2013-12-23 DIAGNOSIS — C50519 Malignant neoplasm of lower-outer quadrant of unspecified female breast: Secondary | ICD-10-CM

## 2013-12-23 DIAGNOSIS — F329 Major depressive disorder, single episode, unspecified: Secondary | ICD-10-CM

## 2013-12-23 DIAGNOSIS — G4700 Insomnia, unspecified: Secondary | ICD-10-CM

## 2013-12-23 DIAGNOSIS — R05 Cough: Secondary | ICD-10-CM | POA: Diagnosis present

## 2013-12-23 DIAGNOSIS — N644 Mastodynia: Secondary | ICD-10-CM

## 2013-12-23 DIAGNOSIS — F32A Depression, unspecified: Secondary | ICD-10-CM

## 2013-12-23 DIAGNOSIS — C773 Secondary and unspecified malignant neoplasm of axilla and upper limb lymph nodes: Secondary | ICD-10-CM

## 2013-12-23 DIAGNOSIS — G629 Polyneuropathy, unspecified: Secondary | ICD-10-CM

## 2013-12-23 DIAGNOSIS — Z5111 Encounter for antineoplastic chemotherapy: Secondary | ICD-10-CM

## 2013-12-23 DIAGNOSIS — G609 Hereditary and idiopathic neuropathy, unspecified: Secondary | ICD-10-CM

## 2013-12-23 LAB — COMPREHENSIVE METABOLIC PANEL (CC13)
ALK PHOS: 103 U/L (ref 40–150)
ALT: 19 U/L (ref 0–55)
AST: 19 U/L (ref 5–34)
Albumin: 3.6 g/dL (ref 3.5–5.0)
Anion Gap: 10 mEq/L (ref 3–11)
BUN: 15.3 mg/dL (ref 7.0–26.0)
CO2: 25 mEq/L (ref 22–29)
Calcium: 9.7 mg/dL (ref 8.4–10.4)
Chloride: 108 mEq/L (ref 98–109)
Creatinine: 0.9 mg/dL (ref 0.6–1.1)
Glucose: 104 mg/dl (ref 70–140)
POTASSIUM: 4.5 meq/L (ref 3.5–5.1)
SODIUM: 143 meq/L (ref 136–145)
TOTAL PROTEIN: 7.1 g/dL (ref 6.4–8.3)
Total Bilirubin: 0.47 mg/dL (ref 0.20–1.20)

## 2013-12-23 LAB — CBC WITH DIFFERENTIAL/PLATELET
BASO%: 0.8 % (ref 0.0–2.0)
Basophils Absolute: 0.1 10*3/uL (ref 0.0–0.1)
EOS ABS: 0.1 10*3/uL (ref 0.0–0.5)
EOS%: 2.1 % (ref 0.0–7.0)
HCT: 42.6 % (ref 34.8–46.6)
HGB: 14.2 g/dL (ref 11.6–15.9)
LYMPH#: 3.2 10*3/uL (ref 0.9–3.3)
LYMPH%: 49 % (ref 14.0–49.7)
MCH: 33.8 pg (ref 25.1–34.0)
MCHC: 33.3 g/dL (ref 31.5–36.0)
MCV: 101.4 fL — ABNORMAL HIGH (ref 79.5–101.0)
MONO#: 0.7 10*3/uL (ref 0.1–0.9)
MONO%: 10 % (ref 0.0–14.0)
NEUT%: 38.1 % — ABNORMAL LOW (ref 38.4–76.8)
NEUTROS ABS: 2.5 10*3/uL (ref 1.5–6.5)
Platelets: 552 10*3/uL — ABNORMAL HIGH (ref 145–400)
RBC: 4.2 10*6/uL (ref 3.70–5.45)
RDW: 13.1 % (ref 11.2–14.5)
WBC: 6.6 10*3/uL (ref 3.9–10.3)

## 2013-12-23 LAB — PREGNANCY, URINE: Preg Test, Ur: NEGATIVE

## 2013-12-23 MED ORDER — ANASTROZOLE 1 MG PO TABS: 1.0000 mg | ORAL_TABLET | Freq: Every day | ORAL | Status: DC

## 2013-12-23 MED ORDER — GOSERELIN ACETATE 3.6 MG ~~LOC~~ IMPL
3.6000 mg | DRUG_IMPLANT | SUBCUTANEOUS | Status: DC
  Administered 2013-12-23: 3.6 mg via SUBCUTANEOUS
  Filled 2013-12-23: qty 3.6

## 2013-12-23 NOTE — Progress Notes (Signed)
ID: Holly Parrish   DOB: August 26, 1970  MR#: [id number]  TWS#:[id number]  PCP: No PCP Per Patient GYN: Lahoma Crocker, MD SU: Rolm Bookbinder, MD OTHER MD: Arloa Koh, MD;  Margaretha Sheffield, MD  CHIEF COMPLAINT:  Hx of Right Breast Cancer   HISTORY OF PRESENT ILLNESS: Holly Parrish noted a mass in her right breast early in 2013. She did not have any healthcare insurance and so delayed getting this looked after. She ultimately underwent a mammogram on 02/16/2012. This showed a mass in the right breast 8:00 position measuring 4 cm; it was firm and palpable. There was also a l 3 cm lower right axillary lymph node. Ultrasound confirmed the presence of the mass in the breast measuring 2.7 x 2.7 x 2.9 cm. There were 2 large right lower right axillary lymph nodes largest one measuring 4 cm a smaller one measuring 3.1 cm. She underwent biopsies of both these areas. 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 The tumor was also estrogen and progesterone receptor positive.  STAGE:  Cancer of lower-outer quadrant of female breast, right Primary site: Breast (Right)  Staging method: AJCC 7th Edition  Clinical: Stage IIB (T2, N1, cM0)  Summary: Stage IIB (T2, N1, cM0)  Remainder history as noted below.  INTERVAL HISTORY: Holly Parrish returns alone today for followup of her right breast cancer. As a brief recap, she received neoadjuvant chemotherapy, followed by a right mastectomy in March of 2014. She continue trastuzumab, completing 1 year in August 2014. She also received postmastectomy radiation which was completed in August of 2014. There was some delay in initiating antiestrogen therapy, awaiting repeat CT scans, and also awaiting evaluation for menorrhagia with irregular cycles.  Holly Parrish is here today to discuss starting her antiestrogen therapy. As noted with previous conversations, she does have a history of lupus and history of TIAs per her report, so we had  planned to avoid tamoxifen and are ready to consider anastrozole in addition to monthly goserelin injections. She was having regular menstrual cycles until she started chemotherapy. Her menstrual cycles then stopped until approximately November 2014 when she had 4 monthly cycles. Her last cycle was on 09/08/2013.   Interval history is notable for the fact that [facility] had a repeat chest CT on 10/25/2013 which was unremarkable, showing no evidence of recurrent or metastatic carcinoma. She also had a final echocardiogram on 10/25/2013 with a well preserved ejection fraction of 60-65% after completion of trastuzumab therapy. She was scheduled to meet with Dr. Donne Hazel, but unfortunately that appointment had to be canceled, so she has not yet had her port removed. She's also due for her left mammogram, and she is complaining today of increased nipple pain.    Holly Parrish continues to have multiple concerns.  She is still unemployed and has had a difficult time getting her Medicaid paperwork.  She does continue see Dr. Greta Doom for chronic pain which she says is stable.  Her biggest complaints today are apparent upper respiratory infection and severe insomnia.  She has had a cough productive of green phlegm times approximately 4 days.  Her highest temperature was 100.0, once last night.  Her temp was normal here in the clinic today.    She complains of severe insomnia with fatigue. Basically she tells me she is able to sleep, but wakes up approximately every hour and that she is "exhausted". She tells me she can "fall asleep anywhere". She continues to have some hot flashes, but does not  believe these are associated with waking up during the night.  She has never had a sleep study.   REVIEW OF SYSTEMS: Holly Parrish has a diffusely positive review of systems. She has had no rashes or skin changes and denies any abnormal bleeding, but does bruise easily. Her energy level is extremely low. Her appetite is fair and she occasionally  has nausea with emesis.  She varies between mild constipation and diarrhea.  She denies abdominal pain.  She denies increased shortness of breath, orthopnea, peripheral swelling or chest pain.  She has occasional palpitations.  She has chronic headaches.  She feels forgetful.  She continues to have neuropathy with associated pain due to previous chemotherapy.   or change in bowel or bladder habits. She's had no cough, increased shortness of breath, chest pain, or palpitations. She continues to have headaches which are "normal for her" and are stable. She denies any change in vision and has had no dizziness. She feels a little forgetful at times.  She continues to have chronic joint pain and muscle pain.   A detailed review of systems is otherwise stable and noncontributory.     PAST MEDICAL HISTORY: Past Medical History  Diagnosis Date  . Lupus   . Joint pain   . Hearing loss   . Bilateral swelling of feet   . Breast cancer 40  . Cancer 37    Cervical, treated with cryoablation  . Asthma   . Neuromuscular disorder   . Migraines   . Headache(784.0)   . Arthritis   . Allergy   . Peripheral neuropathy     bilateral feet  . Hx of radiation therapy 01/25/13- 03/18/13    right chest wall/regional lymph nodes 5040 cGy 28 sessions, right mastectomy scar boost 1000 cGy 5 sessions  . Breast cancer     PAST SURGICAL HISTORY: Past Surgical History  Procedure Laterality Date  . Tonsillectomy    . Cholecystectomy    . 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 Portacath placement  03/11/2012    Procedure: INSERTION PORT-A-CATH;  Surgeon: Rolm Bookbinder, MD;  Location: [facility];  Service: General;  Laterality: Left;  . Hand surgery  1997  . Appendectomy  1995  . Abdominal exploration surgery  [id number]  . Tubal ligation  1997  . Mastectomy modified radical Right 10/06/2012    Procedure: MASTECTOMY MODIFIED RADICAL WITH AXILLARY CONTENT;  Surgeon: Rolm Bookbinder, MD;   Location: [facility];  Service: General;  Laterality: Right;    FAMILY HISTORY Family History  Problem Relation Age of Onset  . Uterine cancer Mother 53  . Pancreatic cancer Paternal Uncle     diagnosed in his 27s; smoker; half uncle related through grandmother  . Cancer Paternal Uncle     Throat  . Liver cancer Maternal Grandmother     not a drinker  . Cancer Maternal Grandmother   . Breast cancer Cousin     3 paternal cousins with breast cancer, onset 55, 49 and one bilateral at  41 and 76  . Cancer Cousin     Breast  . Heart attack Father 72  . Breast cancer Paternal Grandmother 14  . Heart attack Paternal Grandfather   . Throat cancer Paternal Aunt     smoker; half uncle related through grandmother  . Breast cancer Paternal Aunt     2 paternal aunts, both with 2 diagnoses of breast cancer each  . Cystic fibrosis Cousin   . Cancer  Cousin     Breast  . Cancer Cousin     Breast  . Breast cancer Paternal Aunt     GYNECOLOGIC HISTORY: (Reviewed January 01, 2014) G5 P5,  one daughter died at age 36; menarche at age 63; age of first live birth is 1; no recent history of birth control pill use;  having normal menses at the initiation of chemotherapy. LMP 09/08/2013. Patient is status post tubal ligation.   SOCIAL HISTORY: (Reviewed 01-01-14) From [place] where most of her family still resides.  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  She has been married twice and recently has been divorced.  She has 3 children who live in [place], one daughter who moved to this area.    ADVANCED DIRECTIVES:  HEALTH MAINTENANCE: (Reviewed January 01, 2014) History  Substance Use Topics  . Smoking status: Never Smoker   . Smokeless tobacco: Never Used  . Alcohol Use: No     Comment: occasional drinker     Colonoscopy: Never  PAP:  Not on file  Bone density: Never  Lipid panel: Not on file   Allergies  Allergen Reactions  . Bee Venom Anaphylaxis  . Hydrogen Peroxide  Other (See Comments)    "It burns until I bleed."  . Nsaids Anaphylaxis  . Penicillins Anaphylaxis  . Tetanus Toxoids Anaphylaxis  . Vitamin K And Related Anaphylaxis  . Banana Other (See Comments)    Face swells  . Nyquil Multi-Symptom [Pseudoeph-Doxylamine-Dm-Apap]     "makes me crazy"  . Restoril [Temazepam] Other (See Comments)    nightmares  . Tramadol Other (See Comments)    tremors    Current Outpatient Prescriptions  Medication Sig Dispense Refill  . gabapentin (NEURONTIN) 600 MG tablet Take 1,800 mg by mouth 3 (three) times daily.       . ondansetron (ZOFRAN) 8 MG tablet Take 1 tablet (8 mg total) by mouth every 12 (twelve) hours as needed for nausea.  20 tablet  0  . acetaminophen (TYLENOL) 500 MG tablet Take 1,000 mg by mouth every 6 (six) hours as needed for pain.      Marland Kitchen anastrozole (ARIMIDEX) 1 MG tablet Take 1 tablet (1 mg total) by mouth daily.  30 tablet  3  . EPINEPHrine (EPIPEN 2-PAK) 0.3 mg/0.3 mL DEVI Inject 0.3 mg into the muscle once. Bee venom      . PRESCRIPTION MEDICATION Inject 1 application into the vein once a week.       Current Facility-Administered Medications  Medication Dose Route Frequency Provider Last Rate Last Dose  . goserelin (ZOLADEX) injection 3.6 mg  3.6 mg Subcutaneous Q28 days Theotis Burrow, PA-C   3.6 mg at 01/01/14 1230    OBJECTIVE: Young white female who is tearful and anxious  Filed Vitals:   January 01, 2014 1058  BP:   Pulse: 86  Temp: 98.4 F (36.9 C)  Resp: 18     Body mass index is 38.8 kg/(m^2).    ECOG FS: 1 Filed Weights   January 01, 2014 1052  Weight: 212 lb 3.2 oz (96.253 kg)   Physical Exam: HEENT:  Sclerae anicteric.  Oropharynx clear and moist.  No ulcerations or thrush. Neck supple, trachea midline. No thyromegaly..  NODES:  No cervical or supraclavicular lymphadenopathy palpated.  BREAST EXAM:  Patient is status post right mastectomy. There is tenderness to even gentle palpation of the right chest wall but no suspicious  nodularities or skin changes, no evidence of local recurrence. The left breast is extremely  tender to touch around the areolar complex and nipple itself. Unable to elicit any discharge from the nipple. No additional abnormalities are noted. Axillae are benign bilaterally for palpable lymphadenopathy. LUNGS:  Clear to auscultation bilaterally with good excursion.  No wheezes or rhonchi HEART:  Regular rate and rhythm. No murmur appreciated ABDOMEN:  Soft, obese, nontender.  No organomegaly or masses palpated. Positive bowel sounds.  MSK:  No focal spinal tenderness to palpation. Limited range of motion in the right upper extremity due to discomfort. EXTREMITIES:  No peripheral edema.  Mild lymphedema noted in the right upper extremity, nonpitting. SKIN:  No visible rashes or skin lesions. There are some scattered ecchymoses, but nothing excessive or unusual.  No petechiae. No pallor. NEURO:  Nonfocal. Well oriented.  Anxious affect.      LAB RESULTS: Lab Results  Component Value Date   WBC 6.6 12/23/2013   NEUTROABS 2.5 12/23/2013   HGB 14.2 12/23/2013   HCT 42.6 12/23/2013   MCV 101.4* 12/23/2013   PLT 552* 12/23/2013      Chemistry      Component Value Date/Time   NA 143 12/23/2013 1041   NA 139 05/07/2013 1825   K 4.5 12/23/2013 1041   K 4.2 05/07/2013 1825   CL 104 05/07/2013 1825   CL 106 01/11/2013 1437   CO2 25 12/23/2013 1041   CO2 27 05/07/2013 1825   BUN 15.3 12/23/2013 1041   BUN 13 05/07/2013 1825   CREATININE 0.9 12/23/2013 1041   CREATININE 0.82 05/07/2013 1825      Component Value Date/Time   CALCIUM 9.7 12/23/2013 1041   CALCIUM 9.1 05/07/2013 1825   ALKPHOS 103 12/23/2013 1041   ALKPHOS 122* 05/07/2013 1825   AST 19 12/23/2013 1041   AST 18 05/07/2013 1825   ALT 19 12/23/2013 1041   ALT 15 05/07/2013 1825   BILITOT 0.47 12/23/2013 1041   BILITOT 0.2* 05/07/2013 1825        STUDIES:  Echocardiogram on 10/25/2013 showed an ejection fraction of 60-65%.  CT  Chest W Contrast 10/25/2013   CLINICAL DATA: Followup breast carcinoma and indeterminate  pulmonary nodules. Previous right mastectomy, chemotherapy, and  radiation therapy.  EXAM:  CT CHEST WITH CONTRAST  TECHNIQUE:  Multidetector CT imaging of the chest was performed during  intravenous contrast administration.  CONTRAST: 12m OMNIPAQUE IOHEXOL 300 MG/ML SOLN  COMPARISON: 10/11/2012  FINDINGS:  Expected postop changes seen from right mastectomy and axillary  lymph node dissection. No evidence of axillary lymphadenopathy. No  evidence of mediastinal or hilar lymphadenopathy. No evidence of  pleural or pericardial effusion.  Radiation changes are noted in the right upper and middle lobes.  Several tiny less than 5 mm pulmonary nodules are seen in the  peripheral right and left lower lobes which are stable, consistent  with benign etiology. No new or enlarging pulmonary nodules or  masses are identified. No evidence of pulmonary infiltrate or  central endobronchial lesion. No evidence of chest wall mass or  suspicious bone lesions. Both adrenal glands are normal in  appearance.  IMPRESSION:  Stable exam. No evidence of recurrent or metastatic carcinoma within  the thorax.  Electronically Signed  By: JEarle GellM.D.  On: 10/25/2013 08:28     ASSESSMENT: 43y.o.   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   (2)  treated in the neoadjuvant setting.  Status post:  (a)  3 cycles of Taxotere/carbo with Herceptin, with poor tolerance  (b)  One cycle of weekly Carbo/Taxol and Herceptin in early November 2013, with poor tolerance  (c)  2 doses of weekly Carbo/Gemzar with Herceptin with poor tolerance and worsening neuropathy.  Last dose of chemo given in early December 2013.  (3)  Herceptin was continued for a total of  one year through August of 2014. Most recent echocardiogram on 11/16/2012 showed a well preserved ejection fraction of 60 - 65%.  (4)  status post right mastectomy 10/06/2012 with a residual 1.5 cm invasive ductal carcinoma, grade 2. Tumor was focally 0.1 cm from deep margins. 0 of 22 lymph nodes were involved. (ypT1c ypN0)  (5)   Received postmastectomy radiation, completed in August 2014  (6)  ready to initiate antiestrogen therapy, (had been delayed secondary to menorrhagia with irregular cycles).  Still premenopausal, and being started on anastrozole with cholesterol injections given monthly, first injection on 12/23/2013     PLAN: The majority of our one-hour appointment today was spent reviewing Luanne's recent scans, discussing her many concerns, discussing her treatment plan, and coordinating care. I also reviewed the case with Dr. Jana Parrish.  Brandye is ready to start her antiestrogen therapy, and Dr. Jana Parrish concurs that we will avoid tamoxifen and will start her on anastrozole, 1 mg daily. Of course she is still premenopausal, her last menstrual cycle having been only 3 months ago, so she will also need goserelin injections. Dr. Jana Parrish suggests that we give these injections on a monthly basis rather than every 3 months, and she will receive her first injection today, then every 28 days. She was informed of the importance of keeping these appointments, and making sure that she does in fact received these injections as scheduled to prevent her ovaries from "waking up" while on the anastrozole.  I will mention that she is going to eventually have her ovaries removed, likely under the care of Dr. Lahoma Crocker.  We reviewed all of the possible side effects associated with these medications, and she was given both the plan and the drug information in writing today.  I am referring Dhruvi for a chest x-ray to evaluate for an infectious process in light of an apparent upper respiratory  infection. If the x-ray is clear, I have suggested that she treat her symptoms with Mucinex DM in the morning, and either Robitussin-DM or down some at night. I encouraged her to force fluids.  Vikkie still does not have a primary care physician, and I reiterated to her the importance of establishing herself with a local clinic. Not only does she need care for acute illnesses like the current upper respiratory infection, but she also needs to be followed for more chronic issues. I am concerned that she may have some sleep apnea in light of her increased fatigue. She agrees to establish herself with her primary care physician, and will consider discussing with him/her the possibility of a sleep study.  Maily is due for her left mammogram, and I'm requesting that this be a diagnostic mammogram to evaluate the increased pain in the nipple/areolar complex.  We will also requested a bone density be obtained at the same time since we are starting her on an aromatase inhibitor, and of course the goserelin injections will also cause an estrogen deficiency.   I have requested that they reschedule her appointment with Dr. Donne Hazel for port removal. This does need to  be done as soon as possible, and I reiterated that to [facility] today.   Mariyam will return in 4 weeks, June 26, for repeat labs and her next injection. She will see one of our providers approximately 2 weeks later to assess her tolerance of both the anastrozole and the goserelin. Hopefully at that point, she will also have the results of her mammogram and her bone density.   Khamiya was given all the above information in writing, and she voices that her understanding and agreement with our plan today. She will try to get herself establish with a local primary care physician for routine health maintenance. She will call with any additional changes or problems prior to her next appointment.     Bransyn Adami Milda Smart PA-C  12/23/2013

## 2013-12-23 NOTE — Telephone Encounter (Signed)
Called to inform pt of Chest X-Ray. Communicated with pt that results were good and to treat symptoms of cough and drainage with Mucinex DM in morning and Robitussin DM at night and encouraged her to drink more fluids as PA-C advised her earlier. Pt verbalized understanding. No further concerns. Message to be forwarded to [employer], PA-C.

## 2013-12-27 ENCOUNTER — Telehealth: Payer: Self-pay | Admitting: Adult Health

## 2013-12-27 NOTE — Telephone Encounter (Signed)
, °

## 2013-12-29 ENCOUNTER — Other Ambulatory Visit: Payer: Self-pay | Admitting: *Deleted

## 2013-12-29 ENCOUNTER — Telehealth: Payer: Self-pay | Admitting: *Deleted

## 2013-12-29 NOTE — Telephone Encounter (Signed)
This RN reviewed pt's concern with MD.  Per MD ok to give pain medication for short duration . Pt will need to stop the armidex due to not able to tolerate the zoladex. Schedule pt for follow up in one month.  This RN called pt to discuss above.  Per discussion noted pt is allergic to tramadol as well, this RN inquired with pt which pain medication has worked in the past per above recommendation.  Holly Parrish responded with " if he was going to prescribe a narcotic I would rather just keep doing the hot baths "  Holly Parrish also verbalized understanding to stop the anastrazole.  Per phone conversation Holly Parrish states she is attempting to schedule an appointment with Dr Harlow Mares per reconstruction but when she call their office she was told she would need a referral from her primary MD.  Holly Parrish states she has never seen her new primary MD listed on her medicaid card "and if make an appointment it will take over 2 months and I was hoping to see the plastic surgeon before then "  Holly Parrish is currently at work and does not have her medicaid card with her. She will call with name of physician for assistance with referral.

## 2013-12-29 NOTE — Telephone Encounter (Signed)
Message left by pt stating " ever since I got that shot when I was there the other day by bones feel like they are on fire ". " it is bad "  This RN called pt and discussed above.  Per call Dempsey states she has been using tylenol and gabapentin with little benefit. Pt is allergic to NSAIDS.  " the pain is different then my neuropathy- it is deep - like in my bones and when I lay down at night I cannot get comfortable and I am already not sleeping anyway - so now I am just exhausted "  This RN discussed with pt - side effect which could be related to most recent administration of zoladex is self limiting-this RN will discuss above with MD for appropriate recommendations.

## 2014-01-12 ENCOUNTER — Emergency Department (HOSPITAL_COMMUNITY): Payer: Medicare Other

## 2014-01-12 ENCOUNTER — Encounter (HOSPITAL_COMMUNITY): Payer: Self-pay | Admitting: Emergency Medicine

## 2014-01-12 ENCOUNTER — Emergency Department (HOSPITAL_COMMUNITY)
Admission: EM | Admit: 2014-01-12 | Discharge: 2014-01-12 | Disposition: A | Discharge status: Home / Self Care | Payer: Medicare Other | Attending: Emergency Medicine | Admitting: Emergency Medicine

## 2014-01-12 DIAGNOSIS — Z923 Personal history of irradiation: Secondary | ICD-10-CM | POA: Insufficient documentation

## 2014-01-12 DIAGNOSIS — R109 Unspecified abdominal pain: Secondary | ICD-10-CM | POA: Diagnosis present

## 2014-01-12 DIAGNOSIS — Z79899 Other long term (current) drug therapy: Secondary | ICD-10-CM | POA: Diagnosis not present

## 2014-01-12 DIAGNOSIS — C50919 Malignant neoplasm of unspecified site of unspecified female breast: Secondary | ICD-10-CM | POA: Insufficient documentation

## 2014-01-12 DIAGNOSIS — R110 Nausea: Secondary | ICD-10-CM | POA: Diagnosis not present

## 2014-01-12 DIAGNOSIS — Z8541 Personal history of malignant neoplasm of cervix uteri: Secondary | ICD-10-CM | POA: Insufficient documentation

## 2014-01-12 DIAGNOSIS — G43909 Migraine, unspecified, not intractable, without status migrainosus: Secondary | ICD-10-CM | POA: Diagnosis not present

## 2014-01-12 DIAGNOSIS — Z3202 Encounter for pregnancy test, result negative: Secondary | ICD-10-CM | POA: Diagnosis not present

## 2014-01-12 DIAGNOSIS — J45909 Unspecified asthma, uncomplicated: Secondary | ICD-10-CM | POA: Diagnosis not present

## 2014-01-12 DIAGNOSIS — K625 Hemorrhage of anus and rectum: Secondary | ICD-10-CM | POA: Insufficient documentation

## 2014-01-12 DIAGNOSIS — K5900 Constipation, unspecified: Secondary | ICD-10-CM | POA: Insufficient documentation

## 2014-01-12 DIAGNOSIS — Z880 Allergy status to penicillin: Secondary | ICD-10-CM | POA: Insufficient documentation

## 2014-01-12 DIAGNOSIS — H9190 Unspecified hearing loss, unspecified ear: Secondary | ICD-10-CM | POA: Diagnosis not present

## 2014-01-12 DIAGNOSIS — Z8739 Personal history of other diseases of the musculoskeletal system and connective tissue: Secondary | ICD-10-CM | POA: Diagnosis not present

## 2014-01-12 DIAGNOSIS — K5909 Other constipation: Secondary | ICD-10-CM

## 2014-01-12 LAB — CBC WITH DIFFERENTIAL/PLATELET
BASOS ABS: 0 10*3/uL (ref 0.0–0.1)
Basophils Relative: 0 % (ref 0–1)
EOS ABS: 0.1 10*3/uL (ref 0.0–0.7)
Eosinophils Relative: 1 % (ref 0–5)
HCT: 42.2 % (ref 36.0–46.0)
Hemoglobin: 14.1 g/dL (ref 12.0–15.0)
LYMPHS ABS: 2.2 10*3/uL (ref 0.7–4.0)
LYMPHS PCT: 23 % (ref 12–46)
MCH: 33.3 pg (ref 26.0–34.0)
MCHC: 33.4 g/dL (ref 30.0–36.0)
MCV: 99.8 fL (ref 78.0–100.0)
Monocytes Absolute: 0.7 10*3/uL (ref 0.1–1.0)
Monocytes Relative: 7 % (ref 3–12)
Neutro Abs: 6.8 10*3/uL (ref 1.7–7.7)
Neutrophils Relative %: 69 % (ref 43–77)
PLATELETS: 498 10*3/uL — AB (ref 150–400)
RBC: 4.23 MIL/uL (ref 3.87–5.11)
RDW: 13.4 % (ref 11.5–15.5)
WBC: 9.8 10*3/uL (ref 4.0–10.5)

## 2014-01-12 LAB — COMPREHENSIVE METABOLIC PANEL
ALK PHOS: 157 U/L — AB (ref 39–117)
ALT: 62 U/L — ABNORMAL HIGH (ref 0–35)
AST: 47 U/L — ABNORMAL HIGH (ref 0–37)
Albumin: 3.6 g/dL (ref 3.5–5.2)
BUN: 20 mg/dL (ref 6–23)
CO2: 25 mEq/L (ref 19–32)
CREATININE: 0.98 mg/dL (ref 0.50–1.10)
Calcium: 9.4 mg/dL (ref 8.4–10.5)
Chloride: 106 mEq/L (ref 96–112)
GFR calc Af Amer: 80 mL/min — ABNORMAL LOW (ref >90)
GFR, EST NON AFRICAN AMERICAN: 69 mL/min — AB (ref >90)
Glucose, Bld: 118 mg/dL — ABNORMAL HIGH (ref 70–99)
Potassium: 3.8 mEq/L (ref 3.7–5.3)
Sodium: 143 mEq/L (ref 137–147)
Total Bilirubin: 0.3 mg/dL (ref 0.3–1.2)
Total Protein: 7.2 g/dL (ref 6.0–8.3)

## 2014-01-12 LAB — URINALYSIS, ROUTINE W REFLEX MICROSCOPIC
Bilirubin Urine: NEGATIVE
Glucose, UA: NEGATIVE mg/dL
KETONES UR: NEGATIVE mg/dL
NITRITE: NEGATIVE
PH: 5.5 (ref 5.0–8.0)
PROTEIN: 100 mg/dL — AB
Specific Gravity, Urine: 1.019 (ref 1.005–1.030)
UROBILINOGEN UA: 1 mg/dL (ref 0.0–1.0)

## 2014-01-12 LAB — URINE MICROSCOPIC-ADD ON

## 2014-01-12 LAB — LIPASE, BLOOD: LIPASE: 47 U/L (ref 11–59)

## 2014-01-12 LAB — POC OCCULT BLOOD, ED: Fecal Occult Bld: POSITIVE — AB

## 2014-01-12 LAB — POC URINE PREG, ED: Preg Test, Ur: NEGATIVE

## 2014-01-12 MED ORDER — IOHEXOL 300 MG/ML  SOLN
50.0000 mL | Freq: Once | INTRAMUSCULAR | Status: AC | PRN
  Administered 2014-01-12: 50 mL via ORAL

## 2014-01-12 MED ORDER — HYDROMORPHONE HCL PF 1 MG/ML IJ SOLN
0.5000 mg | Freq: Once | INTRAMUSCULAR | Status: AC
  Administered 2014-01-12: 0.5 mg via INTRAVENOUS
  Filled 2014-01-12: qty 1

## 2014-01-12 MED ORDER — FENTANYL CITRATE 0.05 MG/ML IJ SOLN
50.0000 ug | Freq: Once | INTRAMUSCULAR | Status: AC
  Administered 2014-01-12: 50 ug via INTRAVENOUS
  Filled 2014-01-12: qty 2

## 2014-01-12 MED ORDER — MILK AND MOLASSES ENEMA
1.0000 | Freq: Once | RECTAL | Status: AC
  Administered 2014-01-12: 250 mL via RECTAL
  Filled 2014-01-12: qty 250

## 2014-01-12 MED ORDER — POLYETHYLENE GLYCOL 3350 17 GM/SCOOP PO POWD: 17.0000 g | Freq: Every day | ORAL | Status: DC

## 2014-01-12 MED ORDER — MAGNESIUM CITRATE PO SOLN
1.0000 | Freq: Once | ORAL | Status: AC
  Administered 2014-01-12: 1 via ORAL
  Filled 2014-01-12: qty 296

## 2014-01-12 MED ORDER — IOHEXOL 300 MG/ML  SOLN
100.0000 mL | Freq: Once | INTRAMUSCULAR | Status: AC | PRN
  Administered 2014-01-12: 100 mL via INTRAVENOUS

## 2014-01-12 MED ORDER — DOCUSATE SODIUM 100 MG PO CAPS: 100.0000 mg | ORAL_CAPSULE | Freq: Two times a day (BID) | ORAL | Status: DC

## 2014-01-12 MED ORDER — HYDROMORPHONE HCL PF 1 MG/ML IJ SOLN
0.5000 mg | Freq: Once | INTRAMUSCULAR | Status: AC
  Administered 2014-01-12: 0.5 mg via INTRAVENOUS

## 2014-01-12 NOTE — ED Notes (Signed)
Pt reports unremarkable diarrhea last night, followed by abdominal pain and frank moderate rectal bleeding beginning a few hours ago.

## 2014-01-12 NOTE — ED Notes (Signed)
Pt unable to control bowels sufficiently in order to provide urine specimen at this time.

## 2014-01-12 NOTE — ED Notes (Signed)
Pt. Is unable to use the restroom at this time, but is aware that we need a urine specimen.  

## 2014-01-12 NOTE — ED Notes (Signed)
Pt on bedpan at this time having active and unceasing diarrhea.

## 2014-01-12 NOTE — Progress Notes (Signed)
  CARE MANAGEMENT ED NOTE 01/12/2014  Patient:  Holly Parrish, Holly Parrish   Account Number:  [id number]  Date Initiated:  01/12/2014  Documentation initiated by:  Jackelyn Poling  Subjective/Objective Assessment:   43 yr old medicare/medicaid [place] access covered c/o abdominal pain, rectal bleeding, positive rectal bleeding, having unceasing diarrhea     Subjective/Objective Assessment Detail:   e medicaid response hx indicates [facility] as pcp  01/12/14 pt confirms Dr Jana Hakim as her oncologist  and warren pickard at [facility] is pcp but she has seen more of oncologist  Pt appreciative of services and resources rendered     Action/Plan:   ED CM spoke with Pt. Cm provided pt wiht a list of medicaid providers for [facility], information for dr pickard at [employer] and a list of medicare providers within zip [phone]   Action/Plan Detail:   CM provide pt with a pt belonging bag and placed her clothes, sneakers and written pcp information in the bag   Anticipated DC Date:  01/12/2014     Status Recommendation to Physician:   Result of Recommendation:    Other ED [place]  Other  Outpatient Services - Pt will follow up  PCP issues    Choice offered to / List presented to:            Status of service:  Completed, signed off  ED Comments:   ED Comments Detail:

## 2014-01-12 NOTE — ED Notes (Signed)
Patient transported to CT 

## 2014-01-12 NOTE — ED Provider Notes (Signed)
4:09 PM Assumed care of the patient from [facility] , PA-C.  CT abdomen pending.  Patient presents today with abdominal pain and constipation.  Patient with fecal impaction that was disimpacted by Lahoma Rocker.  Acute abdominal series showing possible ileus vs SBO.  CT ab/pelvis pending.  6:36 PM Results of the CT scan as follows: IMPRESSION:  No acute abdominal/ pelvic findings, mass lesions or adenopathy.  Small hiatal hernia.  Status post cholecystectomy.  Discussed results with the patient.  She reports that she is taking Miralax three times a week.  Patient instructed to continue Miralax and take daily.  Patient stable for discharge.  Return precautions given.  Hyman Bible, PA-C 01/15/14 0201

## 2014-01-13 NOTE — ED Provider Notes (Signed)
CSN: [id number]     Arrival date & time 01/12/14  1103 History   First MD Initiated Contact with Patient 01/12/14 1144     Chief Complaint  Patient presents with  . Rectal Bleeding  . Abdominal Pain     (Consider location/radiation/quality/duration/timing/severity/associated sxs/prior Treatment) HPI Holly Parrish is a 43 y.o. female who presents emergency department complaining of abdominal pain and rectal pain. Patient states that her pain began just a few hours ago while at work. Patient reports that she is having associated nausea, denies any vomiting. She states she feels pressure sensation in her rectum like she has to "push, but nothing comes out." Patient also states that she started having watery diarrhea that she cannot control which began last night. She states she saw some blood in her diarrhea today. States it was a small amount. Patient denies any prior similar symptoms. She's currently doing oral chemotherapy for breast cancer. Denies recent travel or antibiotics. She states she takes MiraLAX and Colace daily for chronic constipation.  Patient denies any fever chills. Denies any recent illnesses. Denies any back pain. No other complaints, patient is very tearful.  Past Medical History  Diagnosis Date  . Lupus   . Joint pain   . Hearing loss   . Bilateral swelling of feet   . Breast cancer 40  . Cancer 37    Cervical, treated with cryoablation  . Asthma   . Neuromuscular disorder   . Migraines   . Headache(784.0)   . Arthritis   . Allergy   . Peripheral neuropathy     bilateral feet  . Hx of radiation therapy 01/25/13- 03/18/13    right chest wall/regional lymph nodes 5040 cGy 28 sessions, right mastectomy scar boost 1000 cGy 5 sessions  . Breast cancer    Past Surgical History  Procedure Laterality Date  . Tonsillectomy    . Cholecystectomy    . 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 Portacath placement  03/11/2012    Procedure: INSERTION PORT-A-CATH;  Surgeon:  Rolm Bookbinder, MD;  Location: [facility];  Service: General;  Laterality: Left;  . Hand surgery  1997  . Appendectomy  1995  . Abdominal exploration surgery  [id number]  . Tubal ligation  1997  . Mastectomy modified radical Right 10/06/2012    Procedure: MASTECTOMY MODIFIED RADICAL WITH AXILLARY CONTENT;  Surgeon: Rolm Bookbinder, MD;  Location: [facility];  Service: General;  Laterality: Right;   Family History  Problem Relation Age of Onset  . Uterine cancer Mother 100  . Pancreatic cancer Paternal Uncle     diagnosed in his 58s; smoker; half uncle related through grandmother  . Cancer Paternal Uncle     Throat  . Liver cancer Maternal Grandmother     not a drinker  . Cancer Maternal Grandmother   . Breast cancer Cousin     3 paternal cousins with breast cancer, onset 69, 64 and one bilateral at  80 and 75  . Cancer Cousin     Breast  . Heart attack Father 61  . Breast cancer Paternal Grandmother 13  . Heart attack Paternal Grandfather   . Throat cancer Paternal Aunt     smoker; half uncle related through grandmother  . Breast cancer Paternal Aunt     2 paternal aunts, both with 2 diagnoses of breast cancer each  . Cystic fibrosis Cousin   . Cancer Cousin     Breast  . Cancer Cousin  Breast  . Breast cancer Paternal Aunt    History  Substance Use Topics  . Smoking status: Never Smoker   . Smokeless tobacco: Never Used  . Alcohol Use: No     Comment: occasional drinker   OB History   Grav Para Term Preterm Abortions TAB SAB Ect Mult Living                 Review of Systems  Constitutional: Negative for fever and chills.  Respiratory: Negative for cough, chest tightness and shortness of breath.   Cardiovascular: Negative for chest pain, palpitations and leg swelling.  Gastrointestinal: Positive for abdominal pain, diarrhea, constipation and abdominal distention. Negative for nausea and vomiting.  Genitourinary: Negative for dysuria, urgency,  frequency and flank pain.  Musculoskeletal: Negative for arthralgias, myalgias, neck pain and neck stiffness.  Skin: Negative for rash.  Neurological: Negative for dizziness, weakness and headaches.  All other systems reviewed and are negative.     Allergies  Bee venom; Hydrogen peroxide; Nsaids; Penicillins; Tetanus toxoids; Vitamin k and related; Banana; Nyquil multi-symptom; Restoril; and Tramadol  Home Medications   Prior to Admission medications   Medication Sig Start Date End Date Taking? Authorizing Provider  albuterol (PROVENTIL HFA;VENTOLIN HFA) 108 (90 BASE) MCG/ACT inhaler Inhale 2 puffs into the lungs every 6 (six) hours as needed for wheezing or shortness of breath.   Yes Historical Provider, MD  EPINEPHrine (EPIPEN 2-PAK) 0.3 mg/0.3 mL DEVI Inject 0.3 mg into the muscle once. Bee venom   Yes Historical Provider, MD  gabapentin (NEURONTIN) 600 MG tablet Take 1,800 mg by mouth 3 (three) times daily.  12/21/12  Yes Amy Milda Smart, PA-C  docusate sodium (COLACE) 100 MG capsule Take 1 capsule (100 mg total) by mouth every 12 (twelve) hours. 01/12/14   Heather Laisure, PA-C  polyethylene glycol powder (GLYCOLAX/MIRALAX) powder Take 17 g by mouth daily. 01/12/14   Heather Laisure, PA-C   BP 103/60  Pulse 91  Temp(Src) 98 F (36.7 C) (Oral)  Resp 19  SpO2 94%  LMP 01/10/2014 Physical Exam  Nursing note and vitals reviewed. Constitutional: She appears well-developed and well-nourished. No distress.  HENT:  Head: Normocephalic.  Eyes: Conjunctivae are normal.  Neck: Neck supple.  Cardiovascular: Normal rate, regular rhythm and normal heart sounds.   Pulmonary/Chest: Effort normal and breath sounds normal. No respiratory distress. She has no wheezes. She has no rales.  Abdominal: Soft. Bowel sounds are normal. She exhibits no distension. There is tenderness. There is no rebound.  Diffuse tenderness. No CVA tenderness.  Genitourinary:  Brown watery diarrhea around her rectum.  Impaction upon rectal exam. No blood or blood seen.  Musculoskeletal: She exhibits no edema.  Neurological: She is alert.  Skin: Skin is warm and dry.  Psychiatric: She has a normal mood and affect. Her behavior is normal.    ED Course  Procedures (including critical care time) Labs Review Labs Reviewed  COMPREHENSIVE METABOLIC PANEL - Abnormal; Notable for the following:    Glucose, Bld 118 (*)    AST 47 (*)    ALT 62 (*)    Alkaline Phosphatase 157 (*)    GFR calc non Af Amer 69 (*)    GFR calc Af Amer 80 (*)    All other components within normal limits  CBC WITH DIFFERENTIAL - Abnormal; Notable for the following:    Platelets 498 (*)    All other components within normal limits  URINALYSIS, ROUTINE W REFLEX MICROSCOPIC - Abnormal; Notable  for the following:    APPearance CLOUDY (*)    Hgb urine dipstick SMALL (*)    Protein, ur 100 (*)    Leukocytes, UA TRACE (*)    All other components within normal limits  URINE MICROSCOPIC-ADD ON - Abnormal; Notable for the following:    Casts HYALINE CASTS (*)    All other components within normal limits  POC OCCULT BLOOD, ED - Abnormal; Notable for the following:    Fecal Occult Bld POSITIVE (*)    All other components within normal limits  LIPASE, BLOOD  POC URINE PREG, ED    Imaging Review Ct Abdomen Pelvis W Contrast  01/12/2014   CLINICAL DATA:  Abdominal pain and rectal bleeding.  EXAM: CT ABDOMEN AND PELVIS WITH CONTRAST  TECHNIQUE: Multidetector CT imaging of the abdomen and pelvis was performed using the standard protocol following bolus administration of intravenous contrast.  CONTRAST:  61mL OMNIPAQUE IOHEXOL 300 MG/ML SOLN, 115mL OMNIPAQUE IOHEXOL 300 MG/ML SOLN  COMPARISON:  05/07/2013.  FINDINGS: The lung bases are clear. No pleural effusion or pulmonary lesion. The heart is within normal limits in size. No pericardial effusion. A small hiatal hernia is noted.  The liver is unremarkable. No focal hepatic lesions or  intrahepatic biliary dilatation. The gallbladder surgically absent. No common bile duct dilatation. The pancreas is normal. The spleen has been surgically removed. Splenules are noted in the left upper quadrant. The adrenal glands and kidneys are unremarkable. No masses or hydronephrosis.  The stomach is not well distended with contrast. No gross abnormalities. The duodenum, small bowel and colon are unremarkable except for moderate fluid in the right colon. No bowel wall thickening or pericolonic inflammatory changes. No mesenteric or retroperitoneal mass or adenopathy. The aorta is normal in caliber.  The uterus and ovaries are normal. The bladder is normal. No pelvic mass, adenopathy or free pelvic fluid collections. No inguinal mass or adenopathy.  The bony structures are unremarkable.  IMPRESSION: No acute abdominal/ pelvic findings, mass lesions or adenopathy.  Small hiatal hernia.  Status post cholecystectomy.   Electronically Signed   By: Kalman Jewels M.D.   On: 01/12/2014 18:20   Dg Abd 2 Views  01/12/2014   CLINICAL DATA:  Lower abdominal pain, rectal bleeding.  EXAM: ABDOMEN - 2 VIEW  COMPARISON:  05/07/2013  FINDINGS: Moderate stool burden throughout the colon. Gas within the borderline size left abdominal small bowel loops with scattered air-fluid levels. Prior cholecystectomy. No organomegaly. Multiple calcified phleboliths in the anatomic pelvis. No acute bony abnormality.  IMPRESSION: Mild prominence of a left abdominal small bowel loops. I suspect this represents mild focal ileus although early small bowel obstruction cannot be excluded.   Electronically Signed   By: Rolm Baptise M.D.   On: 01/12/2014 15:50     EKG Interpretation   Date/Time:  Thursday January 12 2014 11:05:04 EDT Ventricular Rate:  81 PR Interval:  136 QRS Duration: 100 QT Interval:  395 QTC Calculation: 458 R Axis:   14 Text Interpretation:  Sinus rhythm Low voltage, precordial leads  Borderline T abnormalities,  anterior leads No significant change since  last tracing Confirmed by [place]  MD, [place] ([id number]) on 01/12/2014 11:08:40  AM      MDM   Final diagnoses:  Other constipation    Patient with rectal pressure and abdominal pain that started today. Watery diarrhea since last night. Upon examination she has diffuse abdominal tenderness with no guarding or rebound tenderness. Her rectal exam showed impaction.  After digitally disimpacted some of the stool, however still large amount of stool remains in the rectum. An enema ordered. Labs ordered. Dilaudid ordered for pain.   Patient had a small hard bowel movement after first enema, will try a second one. Magnesium citrate ordered PO. LFTs slightly elevated, will need outpatient recheck.   Patient had a very large bowel movement after second enema. She states her rectal pressure is gone however she continues to have abdominal pain. Abdominal x-rays obtained which showed prominence of left abdominal small bowel loops. Most likely mild focal ileus however could not rule out early small bowel obstruction. CT abdomen ordered.  Patient signed out with PA LLaisure pending CT abd/pelvis. If negative, ok to d/c home and continue laxatives.        Renold Genta, PA-C 01/13/14 1435  Renold Genta, PA-C 01/13/14 1435

## 2014-01-15 NOTE — ED Provider Notes (Signed)
Medical screening examination/treatment/procedure(s) were performed by non-physician practitioner and as supervising physician I was immediately available for consultation/collaboration.   EKG Interpretation   Date/Time:  Thursday January 12 2014 11:05:04 EDT Ventricular Rate:  81 PR Interval:  136 QRS Duration: 100 QT Interval:  395 QTC Calculation: 458 R Axis:   14 Text Interpretation:  Sinus rhythm Low voltage, precordial leads  Borderline T abnormalities, anterior leads No significant change since  last tracing Confirmed by Wilson Singer  MD, [place] ([id number]) on 01/12/2014 11:08:40  AM        Arbie Cookey, MD 01/15/14 2051

## 2014-01-16 NOTE — ED Provider Notes (Signed)
Medical screening examination/treatment/procedure(s) were performed by non-physician practitioner and as supervising physician I was immediately available for consultation/collaboration.   Leota Jacobsen, MD 01/16/14 [phone]

## 2014-01-17 ENCOUNTER — Other Ambulatory Visit: Payer: Self-pay | Admitting: Physician Assistant

## 2014-01-17 DIAGNOSIS — C50519 Malignant neoplasm of lower-outer quadrant of unspecified female breast: Secondary | ICD-10-CM

## 2014-01-20 ENCOUNTER — Other Ambulatory Visit: Payer: Medicare Other

## 2014-01-20 ENCOUNTER — Ambulatory Visit: Payer: Medicare Other

## 2014-01-23 ENCOUNTER — Other Ambulatory Visit: Payer: Self-pay

## 2014-01-24 ENCOUNTER — Ambulatory Visit: Payer: Medicare Other

## 2014-01-24 ENCOUNTER — Encounter (INDEPENDENT_AMBULATORY_CARE_PROVIDER_SITE_OTHER): Payer: Medicare Other | Admitting: General Surgery

## 2014-01-24 ENCOUNTER — Other Ambulatory Visit: Payer: Medicare Other

## 2014-01-25 ENCOUNTER — Encounter (INDEPENDENT_AMBULATORY_CARE_PROVIDER_SITE_OTHER): Payer: Self-pay | Admitting: General Surgery

## 2014-02-03 ENCOUNTER — Ambulatory Visit: Payer: Medicare Other | Admitting: Adult Health

## 2014-02-17 ENCOUNTER — Ambulatory Visit: Payer: Medicare Other

## 2014-02-17 ENCOUNTER — Other Ambulatory Visit: Payer: Medicare Other

## 2014-02-21 ENCOUNTER — Ambulatory Visit: Payer: Medicare Other

## 2014-02-21 ENCOUNTER — Other Ambulatory Visit: Payer: Medicare Other

## 2014-03-17 ENCOUNTER — Other Ambulatory Visit: Payer: Medicare Other

## 2014-03-17 ENCOUNTER — Ambulatory Visit: Payer: Medicare Other

## 2014-03-21 ENCOUNTER — Other Ambulatory Visit: Payer: Medicare Other

## 2014-03-21 ENCOUNTER — Ambulatory Visit: Payer: Medicare Other

## 2014-03-26 ENCOUNTER — Emergency Department (HOSPITAL_COMMUNITY): Payer: Medicare Other

## 2014-03-26 ENCOUNTER — Encounter (HOSPITAL_COMMUNITY): Payer: Self-pay | Admitting: Emergency Medicine

## 2014-03-26 ENCOUNTER — Emergency Department (HOSPITAL_COMMUNITY)
Admission: EM | Admit: 2014-03-26 | Discharge: 2014-03-26 | Disposition: A | Discharge status: Home / Self Care | Payer: Medicare Other | Attending: Emergency Medicine | Admitting: Emergency Medicine

## 2014-03-26 DIAGNOSIS — Z853 Personal history of malignant neoplasm of breast: Secondary | ICD-10-CM | POA: Insufficient documentation

## 2014-03-26 DIAGNOSIS — S8990XA Unspecified injury of unspecified lower leg, initial encounter: Secondary | ICD-10-CM | POA: Insufficient documentation

## 2014-03-26 DIAGNOSIS — Z8739 Personal history of other diseases of the musculoskeletal system and connective tissue: Secondary | ICD-10-CM | POA: Diagnosis not present

## 2014-03-26 DIAGNOSIS — S99919A Unspecified injury of unspecified ankle, initial encounter: Secondary | ICD-10-CM

## 2014-03-26 DIAGNOSIS — S4980XA Other specified injuries of shoulder and upper arm, unspecified arm, initial encounter: Secondary | ICD-10-CM | POA: Diagnosis not present

## 2014-03-26 DIAGNOSIS — Z880 Allergy status to penicillin: Secondary | ICD-10-CM | POA: Diagnosis not present

## 2014-03-26 DIAGNOSIS — J45909 Unspecified asthma, uncomplicated: Secondary | ICD-10-CM | POA: Insufficient documentation

## 2014-03-26 DIAGNOSIS — Z872 Personal history of diseases of the skin and subcutaneous tissue: Secondary | ICD-10-CM | POA: Diagnosis not present

## 2014-03-26 DIAGNOSIS — W503XXA Accidental bite by another person, initial encounter: Secondary | ICD-10-CM

## 2014-03-26 DIAGNOSIS — S46909A Unspecified injury of unspecified muscle, fascia and tendon at shoulder and upper arm level, unspecified arm, initial encounter: Secondary | ICD-10-CM | POA: Insufficient documentation

## 2014-03-26 DIAGNOSIS — F411 Generalized anxiety disorder: Secondary | ICD-10-CM | POA: Insufficient documentation

## 2014-03-26 DIAGNOSIS — M25511 Pain in right shoulder: Secondary | ICD-10-CM

## 2014-03-26 DIAGNOSIS — Z8669 Personal history of other diseases of the nervous system and sense organs: Secondary | ICD-10-CM | POA: Insufficient documentation

## 2014-03-26 DIAGNOSIS — Z79899 Other long term (current) drug therapy: Secondary | ICD-10-CM | POA: Insufficient documentation

## 2014-03-26 DIAGNOSIS — S61209A Unspecified open wound of unspecified finger without damage to nail, initial encounter: Secondary | ICD-10-CM | POA: Insufficient documentation

## 2014-03-26 DIAGNOSIS — Z8679 Personal history of other diseases of the circulatory system: Secondary | ICD-10-CM | POA: Diagnosis not present

## 2014-03-26 DIAGNOSIS — S0990XA Unspecified injury of head, initial encounter: Secondary | ICD-10-CM | POA: Insufficient documentation

## 2014-03-26 DIAGNOSIS — S99929A Unspecified injury of unspecified foot, initial encounter: Secondary | ICD-10-CM

## 2014-03-26 MED ORDER — OXYCODONE-ACETAMINOPHEN 5-325 MG PO TABS: 1.0000 | ORAL_TABLET | ORAL | Status: DC | PRN

## 2014-03-26 MED ORDER — CLINDAMYCIN HCL 150 MG PO CAPS: 450.0000 mg | ORAL_CAPSULE | Freq: Three times a day (TID) | ORAL | Status: DC

## 2014-03-26 MED ORDER — SULFAMETHOXAZOLE-TRIMETHOPRIM 800-160 MG PO TABS: 1.0000 | ORAL_TABLET | Freq: Two times a day (BID) | ORAL | Status: DC

## 2014-03-26 MED ORDER — ONDANSETRON 4 MG PO TBDP
8.0000 mg | ORAL_TABLET | Freq: Once | ORAL | Status: AC
  Administered 2014-03-26: 8 mg via ORAL
  Filled 2014-03-26: qty 2

## 2014-03-26 MED ORDER — ONDANSETRON HCL 4 MG PO TABS: 4.0000 mg | ORAL_TABLET | Freq: Four times a day (QID) | ORAL | Status: DC

## 2014-03-26 MED ORDER — OXYCODONE-ACETAMINOPHEN 5-325 MG PO TABS
1.0000 | ORAL_TABLET | Freq: Once | ORAL | Status: AC
  Administered 2014-03-26: 2 via ORAL
  Filled 2014-03-26: qty 2

## 2014-03-26 NOTE — ED Notes (Signed)
Pt brought back to room; pt given gown to place on; Megan, RN present in room
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
## 2014-03-26 NOTE — Discharge Instructions (Signed)
Assault, General °Assault includes any behavior, whether intentional or reckless, which results in bodily injury to another person and/or damage to property. Included in this would be any behavior, intentional or reckless, that by its nature would be understood (interpreted) by a reasonable person as intent to harm another person or to damage his/her property. Threats may be oral or written. They may be communicated through regular mail, computer, fax, or phone. These threats may be direct or implied. °FORMS OF ASSAULT INCLUDE: °· Physically assaulting a person. This includes physical threats to inflict physical harm as well as: °¨ Slapping. °¨ Hitting. °¨ Poking. °¨ Kicking. °¨ Punching. °¨ Pushing. °· Arson. °· Sabotage. °· Equipment vandalism. °· Damaging or destroying property. °· Throwing or hitting objects. °· Displaying a weapon or an object that appears to be a weapon in a threatening manner. °¨ Carrying a firearm of any kind. °¨ Using a weapon to harm someone. °· Using greater physical size/strength to intimidate another. °¨ Making intimidating or threatening gestures. °¨ Bullying. °¨ Hazing. °· Intimidating, threatening, hostile, or abusive language directed toward another person. °¨ It communicates the intention to engage in violence against that person. And it leads a reasonable person to expect that violent behavior may occur. °· Stalking another person. °IF IT HAPPENS AGAIN: °· Immediately call for emergency help (911 in [place] °· If someone poses clear and immediate danger to you, seek legal authorities to have a protective or restraining order put in place. °· Less threatening assaults can at least be reported to authorities. °STEPS TO TAKE IF A SEXUAL ASSAULT HAS HAPPENED °· Go to an area of safety. This may include a shelter or staying with a friend. Stay away from the area where you have been attacked. A large percentage of sexual assaults are caused by a friend, relative or associate. °· If  medications were given by your caregiver, take them as directed for the full length of time prescribed. °· Only take over-the-counter or prescription medicines for pain, discomfort, or fever as directed by your caregiver. °· If you have come in contact with a sexual disease, find out if you are to be tested again. If your caregiver is concerned about the HIV/AIDS virus, he/she may require you to have continued testing for several months. °· For the protection of your privacy, test results can not be given over the phone. Make sure you receive the results of your test. If your test results are not back during your visit, make an appointment with your caregiver to find out the results. Do not assume everything is normal if you have not heard from your caregiver or the medical facility. It is important for you to follow up on all of your test results. °· File appropriate papers with authorities. This is important in all assaults, even if it has occurred in a family or by a friend. °SEEK MEDICAL CARE IF: °· You have new problems because of your injuries. °· You have problems that may be because of the medicine you are taking, such as: °¨ Rash. °¨ Itching. °¨ Swelling. °¨ Trouble breathing. °· You develop belly (abdominal) pain, feel sick to your stomach (nausea) or are vomiting. °· You begin to run a temperature. °· You need supportive care or referral to a rape crisis center. These are centers with trained personnel who can help you get through this ordeal. °SEEK IMMEDIATE MEDICAL CARE IF: °· You are afraid of being threatened, beaten, or abused. In [place]., call 911. °· You   receive new injuries related to abuse.  You develop severe pain in any area injured in the assault or have any change in your condition that concerns you.  You faint or lose consciousness.  You develop chest pain or shortness of breath. Document Released: 07/14/2005 Document Revised: 10/06/2011 Document Reviewed: 03/01/2008 [facility] Patient  Information 2015 [place], [place]. This information is not intended to replace advice given to you by your health care provider. Make sure you discuss any questions you have with your health care provider.  Human Bite Human bite wounds can get infected very fast. It is important to get medical treatment. HOME CARE   Follow your doctor's instructions for taking care of your wound.  Only take medicine as told by your doctor.  Take your medicines (antibiotics) as told. Finish them even if you start to feel better.  Keep all doctor visits as told. You may need a tetanus shot if:  You cannot remember when you had your last tetanus shot.  You have never had a tetanus shot.  The injury broke your skin. If you need a tetanus shot and you choose not to have one, you may get tetanus. Sickness from tetanus can be serious. GET HELP RIGHT AWAY IF:   Your wound gets more painful, puffy (swollen), or red.  You have chills.  You have a fever.  You have yellowish-white fluid (pus) coming from the wound.  You see red lines on the skin coming from the wound.  You have pain when you move, or you cannot move the injured part.  You get worse, not better.  You have any questions or concerns. MAKE SURE YOU:   Understand these instructions.  Will watch your condition.  Will get help right away if you are not doing well or get worse. Document Released: 01/07/2001 Document Revised: 10/06/2011 Document Reviewed: 03/05/2011 [facility] Patient Information 2015 [place], [place]. This information is not intended to replace advice given to you by your health care provider. Make sure you discuss any questions you have with your health care provider.   Emergency Department Resource Guide 1) Find a Doctor and Pay Out of Pocket Although you won't have to find out who is covered by your insurance plan, it is a good idea to ask around and get recommendations. You will then need to call the office and see if  the doctor you have chosen will accept you as a new patient and what types of options they offer for patients who are self-pay. Some doctors offer discounts or will set up payment plans for their patients who do not have insurance, but you will need to ask so you aren't surprised when you get to your appointment.  2) Contact Your Local Health Department Not all health departments have doctors that can see patients for sick visits, but many do, so it is worth a call to see if yours does. If you don't know where your local health department is, you can check in your phone book. The CDC also has a tool to help you locate your state's health department, and many state websites also have listings of all of their local health departments.  3) Find a [facility] If your illness is not likely to be very severe or complicated, you may want to try a walk in clinic. These are popping up all over the country in pharmacies, drugstores, and shopping centers. They're usually staffed by nurse practitioners or physician assistants that have been trained to treat common illnesses  and complaints. They're usually fairly quick and inexpensive. However, if you have serious medical issues or chronic medical problems, these are probably not your best option.  No Primary Care Doctor: - Call Health Connect at  [phone] - they can help you locate a primary care doctor that  accepts your insurance, provides certain services, etc. - Physician Referral Service- [phone]  Chronic Pain Problems: Organization         Address  Phone   Notes  [facility]  [phone] Patients need to be referred by their primary care doctor.   Medication Assistance: Organization         Address  Phone   Notes  [facility] [address] [place], [place] [address] [phone] --Must be a resident of [facility] -- Must have NO insurance coverage whatsoever (no  Medicaid/ Medicare, etc.) -- The pt. MUST have a primary care doctor that directs their care regularly and follows them in the community   [facility]ist  [phone]   [employer]  [phone]    Agencies that provide inexpensive medical care: Organization         Address  Phone   Notes  [place]  [phone]   [facility]    [phone]   [facility] [place], [place] [address] [phone]   [place] [address], [place] [phone]   Planned Parenthood    [phone]   [facility]    [phone]   [place] and [place] [address], [place] Phone:  [phone], Fax:  [phone] Hours of Operation:  9 am - 6 pm, M-F.  Also accepts Medicaid/Medicare and self-pay.  [facility] for Miguel Barrera [place], [address], [place] Phone: [phone], Fax: [phone]. Hours of Operation:  8:30 am - 5:30 pm, M-F.  Also accepts Medicaid and self-pay.  [facility] [place] [address], [place] Phone: [phone]   [place], [place], [place] [phone], Ext. [phone] Mondays & Thursdays: 7-9 AM.  First 15 patients are seen on a first come, first serve basis.    [place] Providers:  Organization         Address  Phone   Notes  [facility] [address], [place] [phone] Also accepts self-pay patients.  [facility] [address], [place]  [phone]   [place], [address], [place] [phone]   [facility] [address] [place] [phone]   Lucianne Lei [address] [place] [address], [place]   [phone] Only accepts [place] Access [place] patients after they have their name applied to their card.    Self-Pay (no insurance) in [facility]:  Organization         Address  Phone   Notes  Sickle Cell Patients, [facility] [place] [phone]   [facility] [place] [phone]   [facility] [place]  [address],  [phone]   [facility]/Dr. Osei-Bonsu  [address], [place] or [address], [place] [phone] Phone number for both [facility] and [place] locations is the same.  Urgent Medical and [facility] [address] [place] [phone]   [facility] [address] [place] or [address] [phone]   [facility] [address] [place] [phone], phone; [phone], fax Sees patients 1st and 3rd Saturday of every month.  Must not qualify for public or private insurance (i.e. Medicaid, Medicare, [place] Health Choice, Veterans' Benefits)  Household income should be no more than 200% of the poverty level The clinic cannot treat you if you are pregnant or think you are pregnant  Sexually transmitted diseases are not treated at the clinic.    Dental Care: Organization         Address  Phone  Notes  [facility] Department of [facility] [place] [phone] Accepts children up to age 77 who are enrolled in [place] or [place]; pregnant women with a Medicaid card; and children who have applied for Medicaid or [place] Health Choice, but were declined, whose parents can pay a reduced fee at time of service.  [facility] Department of [facility]  [address], [place] [phone] Accepts children up to age 36 who are enrolled in [place] or [place]; pregnant women with a Medicaid card; and children who have applied for Medicaid or [place] Health Choice,  but were declined, whose parents can pay a reduced fee at time of service.  [place] Adult [facility] Access PRO[facility]  [place] [phone] Patients are seen by appointment only. Walk-ins are not accepted. [place] will see patients 28 years of age and older. Monday - Tuesday (8am-5pm) Most Wednesdays (8:30-5pm) $30 per visit, cash only  [facility] Adult Dental Access PROGRAM  [address], [facility] [phone] Patients are seen by appointment only. Walk-ins are not accepted. [place] will see patients 67 years of age and older. One Wednesday Evening (Monthly: Volunteer Based).  $30 per visit, cash only  [place]  [phone] for adults; Children under age 49, call Graduate Pediatric Dentistry at [phone]. Children aged 19-14, please call [phone] to request a pediatric application.  Dental services are provided in all areas of dental care including fillings, crowns and bridges, complete and partial dentures, implants, gum treatment, root canals, and extractions. Preventive care is also provided. Treatment is provided to both adults and children. Patients are selected via a lottery and there is often a waiting list.   [facility] [address] [place]  [phone] [email or web address]   [facility] [address] [place], [place] [phone], Ext. [phone] Second and Fourth Thursday of each month, opens at 6:30 AM; Clinic ends at 9 AM.  Patients are seen on a first-come first-served basis, and a limited number are seen during each clinic.   [facility]  [address] Hillard Danker [place], [place] [phone]   Eligibility Requirements You must have lived in [place], [place], or [place] counties for at least the last three months.   You cannot be eligible for state or federal sponsored [employer], including [employer], [place], or [employer].   You generally  cannot be eligible for healthcare insurance through your employer.    How to apply: Eligibility screenings are held every Tuesday and Wednesday  afternoon from 1:00 pm until 4:00 pm. You do not need an appointment for the interview!  [facility] [address], [place], [place]   Gene Autry  [place] Department  [place]  [phone]    Behavioral Health Resources in the Community: Intensive Outpatient Programs Organization         Address  Phone  Notes  [place]. [address] [place], [place] [phone]   [facility] Outpatient [address] [place], [place]   ADS: Alcohol & Drug Svcs [address], [place], [place]   [place] [address],  [place], [place] or [phone]   Substance Abuse Resources Organization         Address  Phone  Notes  Alcohol and Drug Services  [phone]   [place]  [phone]   [facility] [place]   Chinita Pester  [phone]   Residential & Outpatient Substance Abuse Program  [phone]   Psychological Services Organization         Address  Phone  Notes  [facility] [place]  [place]  [phone]   [place] [address], [place] or [phone]    Mobile Crisis Teams Organization         Address  Phone  Notes  [facility], Mobile Crisis Care Unit  [phone]   [facility]ive Psychotherapeutic Services  [address] [place], [place]   [place] [address] [place] [phone]    Self-Help/Support Groups Organization         Address  Phone             Notes  [place] of [place] - variety of support groups  [place] Call for more  information  Narcotics Anonymous (NA), [facility] [address], [employer] [place]  2 meetings at this location   Special educational needs teacher         Address  Phone  Notes  [facility]idential [facility] [place],    [place]  [phone]   [facility]  [address], [place] [address]0109, [place], [place]   [place], [place] [phone] Admissions: 8am-3pm M-F  Incentives Substance [place] [address],    [place], [place] [phone]   [facility] [address] [place], [place], [place]   [facility] [address]  [place], [place]   [facility]ight Programs - Intensive Outpatient [address], [place], [place]   [facility] [place].,  [place], [place] [phone] or [phone]   [facility] Services (RTS) [address], [place], [place] Accepts Medicaid  [facility] [place] [address] [place] [address]  [place] [phone] Substance Abuse/Addiction Treatment   [facility] Organization         Address  Phone  Notes  [facility]  [phone]   Domenic Schwab, PhD [address] Arlis Porta [place], [place]   [phone] or [phone]   [place], [place] [phone]   [place] [address], [place], [place] [phone] Insurance/Medicaid/sponsorship through [employer] and Families  [place], [place] [phone] [place], [place] [phone]    Dr. Adele Schilder  [phone]   [facility] Dept. 1) [address], [place] 2) [place] 3)  [place] [address], [place] [phone]  [phone]   [place] [phone] or [phone] (After Hours)
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
## 2014-03-26 NOTE — ED Notes (Signed)
Pt returned to exam room from CT and x-ray. No signs of distress noted.
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
## 2014-04-14 ENCOUNTER — Ambulatory Visit: Payer: Medicare Other

## 2014-04-14 ENCOUNTER — Other Ambulatory Visit: Payer: Medicare Other

## 2014-04-18 ENCOUNTER — Ambulatory Visit: Payer: Medicare Other

## 2014-04-18 ENCOUNTER — Other Ambulatory Visit: Payer: Medicare Other

## 2014-09-23 ENCOUNTER — Emergency Department (HOSPITAL_COMMUNITY): Payer: Medicare Other

## 2014-09-23 ENCOUNTER — Encounter (HOSPITAL_COMMUNITY): Payer: Self-pay | Admitting: Emergency Medicine

## 2014-09-23 ENCOUNTER — Emergency Department (HOSPITAL_COMMUNITY)
Admission: EM | Admit: 2014-09-23 | Discharge: 2014-09-23 | Disposition: A | Discharge status: Home / Self Care | Payer: Medicare Other | Source: Home / Self Care | Attending: Emergency Medicine | Admitting: Emergency Medicine

## 2014-09-23 DIAGNOSIS — Z8669 Personal history of other diseases of the nervous system and sense organs: Secondary | ICD-10-CM | POA: Insufficient documentation

## 2014-09-23 DIAGNOSIS — R0602 Shortness of breath: Secondary | ICD-10-CM | POA: Diagnosis not present

## 2014-09-23 DIAGNOSIS — Z853 Personal history of malignant neoplasm of breast: Secondary | ICD-10-CM | POA: Insufficient documentation

## 2014-09-23 DIAGNOSIS — Z79899 Other long term (current) drug therapy: Secondary | ICD-10-CM

## 2014-09-23 DIAGNOSIS — Z792 Long term (current) use of antibiotics: Secondary | ICD-10-CM | POA: Insufficient documentation

## 2014-09-23 DIAGNOSIS — Z7952 Long term (current) use of systemic steroids: Secondary | ICD-10-CM

## 2014-09-23 DIAGNOSIS — M1990 Unspecified osteoarthritis, unspecified site: Secondary | ICD-10-CM

## 2014-09-23 DIAGNOSIS — J189 Pneumonia, unspecified organism: Secondary | ICD-10-CM | POA: Diagnosis not present

## 2014-09-23 DIAGNOSIS — Z8541 Personal history of malignant neoplasm of cervix uteri: Secondary | ICD-10-CM | POA: Insufficient documentation

## 2014-09-23 DIAGNOSIS — Z8679 Personal history of other diseases of the circulatory system: Secondary | ICD-10-CM

## 2014-09-23 DIAGNOSIS — T7840XA Allergy, unspecified, initial encounter: Secondary | ICD-10-CM

## 2014-09-23 DIAGNOSIS — J45901 Unspecified asthma with (acute) exacerbation: Secondary | ICD-10-CM | POA: Insufficient documentation

## 2014-09-23 LAB — I-STAT CHEM 8, ED
BUN: 15 mg/dL (ref 6–23)
Calcium, Ion: 1.07 mmol/L — ABNORMAL LOW (ref 1.12–1.23)
Chloride: 101 mmol/L (ref 96–112)
Creatinine, Ser: 0.9 mg/dL (ref 0.50–1.10)
GLUCOSE: 89 mg/dL (ref 70–99)
HCT: 41 % (ref 36.0–46.0)
HEMOGLOBIN: 13.9 g/dL (ref 12.0–15.0)
Potassium: 3.5 mmol/L (ref 3.5–5.1)
SODIUM: 136 mmol/L (ref 135–145)
TCO2: 26 mmol/L (ref 0–100)

## 2014-09-23 LAB — CBC WITH DIFFERENTIAL/PLATELET
Basophils Absolute: 0.1 10*3/uL (ref 0.0–0.1)
Basophils Relative: 1 % (ref 0–1)
Eosinophils Absolute: 0.1 10*3/uL (ref 0.0–0.7)
Eosinophils Relative: 1 % (ref 0–5)
HCT: 39.4 % (ref 36.0–46.0)
Hemoglobin: 13.1 g/dL (ref 12.0–15.0)
LYMPHS PCT: 31 % (ref 12–46)
Lymphs Abs: 1.8 10*3/uL (ref 0.7–4.0)
MCH: 32.6 pg (ref 26.0–34.0)
MCHC: 33.2 g/dL (ref 30.0–36.0)
MCV: 98 fL (ref 78.0–100.0)
MONOS PCT: 12 % (ref 3–12)
Monocytes Absolute: 0.7 10*3/uL (ref 0.1–1.0)
NEUTROS ABS: 3.3 10*3/uL (ref 1.7–7.7)
Neutrophils Relative %: 55 % (ref 43–77)
PLATELETS: 476 10*3/uL — AB (ref 150–400)
RBC: 4.02 MIL/uL (ref 3.87–5.11)
RDW: 13.4 % (ref 11.5–15.5)
WBC: 5.9 10*3/uL (ref 4.0–10.5)

## 2014-09-23 LAB — BLOOD GAS, ARTERIAL
Acid-base deficit: 0.1 mmol/L (ref 0.0–2.0)
Bicarbonate: 24.8 mEq/L — ABNORMAL HIGH (ref 20.0–24.0)
DRAWN BY: 31814
FIO2: 0.21 %
O2 Saturation: 89.6 %
PO2 ART: 53.3 mmHg — AB (ref 80.0–100.0)
Patient temperature: 97.6
TCO2: 22.3 mmol/L (ref 0–100)
pCO2 arterial: 42.9 mmHg (ref 35.0–45.0)
pH, Arterial: 7.377 (ref 7.350–7.450)

## 2014-09-23 LAB — POC URINE PREG, ED: PREG TEST UR: NEGATIVE

## 2014-09-23 LAB — TROPONIN I: Troponin I: <0.03 ng/mL (ref <0.031)

## 2014-09-23 MED ORDER — ALBUTEROL SULFATE (2.5 MG/3ML) 0.083% IN NEBU
5.0000 mg | INHALATION_SOLUTION | Freq: Once | RESPIRATORY_TRACT | Status: AC
  Administered 2014-09-23: 5 mg via RESPIRATORY_TRACT
  Filled 2014-09-23: qty 6

## 2014-09-23 MED ORDER — PANTOPRAZOLE SODIUM 40 MG IV SOLR
40.0000 mg | Freq: Once | INTRAVENOUS | Status: AC
  Administered 2014-09-23: 40 mg via INTRAVENOUS
  Filled 2014-09-23: qty 40

## 2014-09-23 MED ORDER — METHYLPREDNISOLONE SODIUM SUCC 125 MG IJ SOLR
125.0000 mg | Freq: Once | INTRAMUSCULAR | Status: AC
  Administered 2014-09-23: 125 mg via INTRAVENOUS
  Filled 2014-09-23: qty 2

## 2014-09-23 MED ORDER — FAMOTIDINE IN NACL 20-0.9 MG/50ML-% IV SOLN
20.0000 mg | Freq: Once | INTRAVENOUS | Status: AC
  Administered 2014-09-23: 20 mg via INTRAVENOUS
  Filled 2014-09-23: qty 50

## 2014-09-23 MED ORDER — IOHEXOL 350 MG/ML SOLN
100.0000 mL | Freq: Once | INTRAVENOUS | Status: AC | PRN
  Administered 2014-09-23: 100 mL via INTRAVENOUS

## 2014-09-23 MED ORDER — IPRATROPIUM-ALBUTEROL 0.5-2.5 (3) MG/3ML IN SOLN
3.0000 mL | Freq: Once | RESPIRATORY_TRACT | Status: AC
  Administered 2014-09-23: 3 mL via RESPIRATORY_TRACT
  Filled 2014-09-23: qty 3

## 2014-09-23 MED ORDER — ALPRAZOLAM 0.5 MG PO TABS: 0.5000 mg | ORAL_TABLET | Freq: Once | ORAL | Status: DC

## 2014-09-23 MED ORDER — SODIUM CHLORIDE 0.9 % IV BOLUS (SEPSIS)
1000.0000 mL | Freq: Once | INTRAVENOUS | Status: AC
  Administered 2014-09-23: 1000 mL via INTRAVENOUS

## 2014-09-23 MED ORDER — PREDNISONE 20 MG PO TABS
ORAL_TABLET | ORAL | Status: DC
Start: 2014-09-23 — End: 2014-09-28

## 2014-09-23 MED ORDER — GI COCKTAIL ~~LOC~~
30.0000 mL | Freq: Once | ORAL | Status: AC
  Administered 2014-09-23: 30 mL via ORAL
  Filled 2014-09-23: qty 30

## 2014-09-23 NOTE — ED Provider Notes (Addendum)
CSN: [id number]     Arrival date & time 09/23/14  0335 History   First MD Initiated Contact with Patient 09/23/14 [phone]     Chief Complaint  Patient presents with  . Shortness of Breath   Medical screening examination/treatment/procedure(s) were conducted as a shared visit with non-physician practitioner(s) and myself.  I personally evaluated the patient during the encounter.   EKG Interpretation   Date/Time:  Saturday September 23 2014 05:05:43 EST Ventricular Rate:  107 PR Interval:  154 QRS Duration: 101 QT Interval:  350 QTC Calculation: 467 R Axis:   -15 Text Interpretation:  Sinus tachycardia Abnormal R-wave progression, late  transition Confirmed by [facility]  MD, Emmaline Kluver ([id number]) on 09/23/2014  5:08:19 AM       (Consider location/radiation/quality/duration/timing/severity/associated sxs/prior Treatment) Patient is a 44 y.o. female presenting with shortness of breath. The history is provided by the patient.  Shortness of Breath Severity:  Severe Onset quality:  Sudden Timing:  Constant Progression:  Unchanged Chronicity:  Recurrent Context: animal exposure   Relieved by:  Nothing Worsened by:  Nothing tried Ineffective treatments:  None tried Associated symptoms: no chest pain, no fever, no sore throat and no sputum production   Risk factors: no prolonged immobilization and no recent surgery     Past Medical History  Diagnosis Date  . Lupus   . Joint pain   . Hearing loss   . Bilateral swelling of feet   . Asthma   . Neuromuscular disorder   . Migraines   . Headache(784.0)   . Arthritis   . Allergy   . Peripheral neuropathy     bilateral feet  . Hx of radiation therapy 01/25/13- 03/18/13    right chest wall/regional lymph nodes 5040 cGy 28 sessions, right mastectomy scar boost 1000 cGy 5 sessions  . Breast cancer 40  . Cancer 37    Cervical, treated with cryoablation  . Breast cancer    Past Surgical History  Procedure Laterality Date  .  Tonsillectomy    . Cholecystectomy    . 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 Portacath placement  03/11/2012    Procedure: INSERTION PORT-A-CATH;  Surgeon: Rolm Bookbinder, MD;  Location: [facility];  Service: General;  Laterality: Left;  . Hand surgery  1997  . Appendectomy  1995  . Abdominal exploration surgery  [id number]  . Tubal ligation  1997  . Mastectomy modified radical Right 10/06/2012    Procedure: MASTECTOMY MODIFIED RADICAL WITH AXILLARY CONTENT;  Surgeon: Rolm Bookbinder, MD;  Location: [facility];  Service: General;  Laterality: Right;   Family History  Problem Relation Age of Onset  . Uterine cancer Mother 80  . Pancreatic cancer Paternal Uncle     diagnosed in his 43s; smoker; half uncle related through grandmother  . Cancer Paternal Uncle     Throat  . Liver cancer Maternal Grandmother     not a drinker  . Cancer Maternal Grandmother   . Breast cancer Cousin     3 paternal cousins with breast cancer, onset 62, 4 and one bilateral at  20 and 32  . Cancer Cousin     Breast  . Heart attack Father 39  . Breast cancer Paternal Grandmother 68  . Heart attack Paternal Grandfather   . Throat cancer Paternal Aunt     smoker; half uncle related through grandmother  . Breast cancer Paternal Aunt     2 paternal aunts, both with 2 diagnoses of  breast cancer each  . Cystic fibrosis Cousin   . Cancer Cousin     Breast  . Cancer Cousin     Breast  . Breast cancer Paternal Aunt    History  Substance Use Topics  . Smoking status: Never Smoker   . Smokeless tobacco: Never Used  . Alcohol Use: No     Comment: occasional drinker   OB History    No data available     Review of Systems  Constitutional: Negative for fever.  HENT: Negative for sore throat, trouble swallowing and voice change.   Respiratory: Positive for shortness of breath. Negative for sputum production.   Cardiovascular: Negative for chest pain, palpitations and leg swelling.  All  other systems reviewed and are negative.     Allergies  Bee venom; Hydrogen peroxide; Nsaids; Penicillins; Tetanus toxoids; Vitamin k and related; Banana; Nyquil multi-symptom; Restoril; and Tramadol  Home Medications   Prior to Admission medications   Medication Sig Start Date End Date Taking? Authorizing Provider  albuterol (PROVENTIL HFA;VENTOLIN HFA) 108 (90 BASE) MCG/ACT inhaler Inhale 2 puffs into the lungs every 6 (six) hours as needed for wheezing or shortness of breath.   Yes Historical Provider, MD  gabapentin (NEURONTIN) 300 MG capsule Take 900 mg by mouth 3 (three) times daily.    Yes Historical Provider, MD  oxyCODONE (OXY IR/ROXICODONE) 5 MG immediate release tablet Take 5 mg by mouth every 4 (four) hours as needed for severe pain.   Yes Historical Provider, MD  predniSONE (DELTASONE) 5 MG tablet Take 5 mg by mouth daily with breakfast.   Yes Historical Provider, MD  clindamycin (CLEOCIN) 150 MG capsule Take 3 capsules (450 mg total) by mouth 3 (three) times daily. May dispense as 150mg  capsules Patient not taking: Reported on 09/23/2014 03/26/14   Elwyn Lade, PA-C  ondansetron (ZOFRAN) 4 MG tablet Take 1 tablet (4 mg total) by mouth every 6 (six) hours. Patient not taking: Reported on 09/23/2014 03/26/14   Elwyn Lade, PA-C  oxyCODONE-acetaminophen (PERCOCET/ROXICET) 5-325 MG per tablet Take 1 tablet by mouth every 4 (four) hours as needed for severe pain. May take 2 tablets PO q 6 hours for severe pain - Do not take with Tylenol as this tablet already contains tylenol Patient not taking: Reported on 09/23/2014 03/26/14   Elwyn Lade, PA-C  polyethylene glycol (MIRALAX / Floria Raveling) packet Take 17 g by mouth daily as needed for mild constipation.    Historical Provider, MD  predniSONE (DELTASONE) 20 MG tablet 3 tabs po day one, then 2 po daily x 4 days 09/23/14   Courvoisier Hamblen K Jaxxon Naeem-Rasch, MD  sulfamethoxazole-trimethoprim (SEPTRA DS) 800-160 MG per tablet Take 1 tablet by  mouth every 12 (twelve) hours. Patient not taking: Reported on 09/23/2014 03/26/14   Elwyn Lade, PA-C   BP 91/62 mmHg  Pulse 101  Temp(Src) 97.6 F (36.4 C) (Oral)  Resp 20  Ht 5\' 2"  (1.575 m)  Wt 180 lb (81.647 kg)  BMI 32.91 kg/m2  SpO2 94%  LMP 09/19/2014 Physical Exam  Constitutional: She is oriented to person, place, and time. She appears well-developed and well-nourished. No distress.  HENT:  Head: Normocephalic and atraumatic.  Mouth/Throat: Oropharynx is clear and moist. No oropharyngeal exudate.  No swelling of the lips tongue or uvula  Eyes: Conjunctivae and EOM are normal. Pupils are equal, round, and reactive to light.  Neck: Normal range of motion. Neck supple.  Cardiovascular: Normal rate, regular rhythm and intact  distal pulses.   Pulmonary/Chest: Effort normal. No stridor. No respiratory distress. She has wheezes. She has no rales. She exhibits no tenderness.  Abdominal: Soft. Bowel sounds are normal. There is no tenderness. There is no rebound and no guarding.  Musculoskeletal: Normal range of motion.  Neurological: She is alert and oriented to person, place, and time. She has normal reflexes.  Skin: Skin is warm and dry.  Psychiatric: She has a normal mood and affect.    ED Course  Procedures (including critical care time) Labs Review Labs Reviewed  CBC WITH DIFFERENTIAL/PLATELET - Abnormal; Notable for the following:    Platelets 476 (*)    All other components within normal limits  BLOOD GAS, ARTERIAL - Abnormal; Notable for the following:    pO2, Arterial 53.3 (*)    Bicarbonate 24.8 (*)    All other components within normal limits  I-STAT CHEM 8, ED - Abnormal; Notable for the following:    Calcium, Ion 1.07 (*)    All other components within normal limits  TROPONIN I  POC URINE PREG, ED    Imaging Review Ct Angio Chest Pe W/cm &/or Wo Cm  09/23/2014   CLINICAL DATA:  Acute onset of shortness of breath for 1 hour.  EXAM: CT ANGIOGRAPHY CHEST  WITH CONTRAST  TECHNIQUE: Multidetector CT imaging of the chest was performed using the standard protocol during bolus administration of intravenous contrast. Multiplanar CT image reconstructions and MIPs were obtained to evaluate the vascular anatomy.  CONTRAST:  155mL OMNIPAQUE IOHEXOL 350 MG/ML SOLN  COMPARISON:  Radiograph 03/26/2014.  CT 10/25/2013  FINDINGS: There are no filling defects within the pulmonary arteries to suggest pulmonary embolus.  The thoracic aorta is normal in caliber, no evidence of dissection. The heart is at the upper limits of normal in size. There is no mediastinal or hilar adenopathy.  Patient is post right mastectomy and axillary lymph node dissection. There is no right axillary adenopathy. No left axillary adenopathy.  Reticulation in the right upper and right middle lobes anteriorly consistent with postradiation change. The previous tiny less than 5 mm pulmonary nodules in the periphery of the left lower lobe are not significantly changed, some are obscured by associated atelectasis. The tiny nodule in the periphery of the right lower lobe is unchanged. No new nodule. Mild hypoventilatory changes at the lung bases. No consolidation to suggest pneumonia.  No acute abnormality in the included upper abdomen. Postcholecystectomy change. 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  There are no lytic or blastic osseous lesions. Mild degenerative change in the thoracic spine. Left chest port in place, tip in the SVC.  Review of the MIP images confirms the above findings.  IMPRESSION: 1. No pulmonary embolus. 2. Mild hypoventilatory change at the lung bases. There is otherwise no acute intrathoracic abnormality. 3. Small tiny nodules in the periphery of the right and left lower lobes are unchanged.   Electronically Signed   By: Jeb Levering M.D.   On: 09/23/2014 06:00     EKG Interpretation   Date/Time:  Saturday September 23 2014 05:05:43  EST Ventricular Rate:  107 PR Interval:  154 QRS Duration: 101 QT Interval:  350 QTC Calculation: 467 R Axis:   -15 Text Interpretation:  Sinus tachycardia Abnormal R-wave progression, late  transition Confirmed by [facility]  MD, Nilaya Bouie ([id number]) on 09/23/2014  5:08:19 AM      MDM   Final diagnoses:  Allergic reaction, initial encounter  Medications  ALPRAZolam (XANAX) tablet 0.5 mg (0.5 mg Oral Not Given 09/23/14 0633)  albuterol (PROVENTIL) (2.5 MG/3ML) 0.083% nebulizer solution 5 mg (5 mg Nebulization Given 09/23/14 0408)  methylPREDNISolone sodium succinate (SOLU-MEDROL) 125 mg/2 mL injection 125 mg (125 mg Intravenous Given 09/23/14 0500)  famotidine (PEPCID) IVPB 20 mg (0 mg Intravenous Stopped 09/23/14 0530)  sodium chloride 0.9 % bolus 1,000 mL (1,000 mLs Intravenous New Bag/Given 09/23/14 0500)  iohexol (OMNIPAQUE) 350 MG/ML injection 100 mL (100 mLs Intravenous Contrast Given 09/23/14 0542)  pantoprazole (PROTONIX) injection 40 mg (40 mg Intravenous Given 09/23/14 0633)  gi cocktail (Maalox,Lidocaine,Donnatal) (30 mLs Oral Given 09/23/14 6553)  ipratropium-albuterol (DUONEB) 0.5-2.5 (3) MG/3ML nebulizer solution 3 mL (3 mLs Nebulization Given 09/23/14 7482)     Patient cannot stay or be admitted as no one is available to care.  She is alert and oriented and has decision making capacity to refuse care.  She cannot stay.  We will write for steroids.  She understands that the risk of leaving against medical advice are but are not limited to death and morbidity from prolonged hypoxia.  She is welcome to return at any time   Evalie Hargraves K Naevia Unterreiner-Rasch, MD 09/23/14 [id number]  Anaysia Germer K Nicki Gracy-Rasch, MD 09/23/14 2308

## 2014-09-23 NOTE — ED Provider Notes (Signed)
CSN: [id number]     Arrival date & time 09/23/14  0335 History   First MD Initiated Contact with Patient 09/23/14 [phone]     Chief Complaint  Patient presents with  . Shortness of Breath     (Consider location/radiation/quality/duration/timing/severity/associated sxs/prior Treatment) Patient is a 44 y.o. female presenting with shortness of breath. The history is provided by the patient. No language interpreter was used.  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 No fever. She has not been around any allergens that she is aware of. She has a history of asthma and has used her inhaler without relief.    Past Medical History  Diagnosis Date  . Lupus   . Joint pain   . Hearing loss   . Bilateral swelling of feet   . Asthma   . Neuromuscular disorder   . Migraines   . Headache(784.0)   . Arthritis   . Allergy   . Peripheral neuropathy     bilateral feet  . Hx of radiation therapy 01/25/13- 03/18/13    right chest wall/regional lymph nodes 5040 cGy 28 sessions, right mastectomy scar boost 1000 cGy 5 sessions  . Breast cancer 40  . Cancer 37    Cervical, treated with cryoablation  . Breast cancer    Past Surgical History  Procedure Laterality Date  . Tonsillectomy    . Cholecystectomy    . 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 Portacath placement  03/11/2012    Procedure: INSERTION PORT-A-CATH;  Surgeon: Rolm Bookbinder, MD;  Location: [place];  Service: General;  Laterality: Left;  . Hand surgery  1997  . Appendectomy  1995  . Abdominal exploration surgery  [id number]  . Tubal ligation  1997  . Mastectomy modified radical Right 10/06/2012    Procedure: MASTECTOMY MODIFIED RADICAL WITH AXILLARY CONTENT;  Surgeon: Rolm Bookbinder,  MD;  Location: [facility];  Service: General;  Laterality: Right;   Family History  Problem Relation Age of Onset  . Uterine cancer Mother 10  . Pancreatic cancer Paternal Uncle     diagnosed in his 39s; smoker; half uncle related through grandmother  . Cancer Paternal Uncle     Throat  . Liver cancer Maternal Grandmother     not a drinker  . Cancer Maternal Grandmother   . Breast cancer Cousin     3 paternal cousins with breast cancer, onset 61, 6 and one bilateral at  13 and 47  . Cancer Cousin     Breast  . Heart attack Father 43  . Breast cancer Paternal Grandmother 61  . Heart attack Paternal Grandfather   . Throat cancer Paternal Aunt     smoker; half uncle related through grandmother  . Breast cancer Paternal Aunt     2 paternal aunts, both with 2 diagnoses of breast cancer each  . Cystic fibrosis Cousin   . Cancer Cousin     Breast  . Cancer Cousin     Breast  . Breast cancer Paternal Aunt    History  Substance Use Topics  . Smoking status: Never Smoker   . Smokeless tobacco: Never Used  . Alcohol Use: No     Comment: occasional drinker   OB History    No data  available     Review of Systems  Constitutional: Negative for fever and chills.  HENT: Negative.   Respiratory: Positive for shortness of breath.   Cardiovascular: Negative.   Gastrointestinal: Negative.  Negative for vomiting.  Musculoskeletal: Negative.  Negative for myalgias.  Skin: Negative.   Neurological: Negative.  Negative for syncope.      Allergies  Bee venom; Hydrogen peroxide; Nsaids; Penicillins; Tetanus toxoids; Vitamin k and related; Banana; Nyquil multi-symptom; Restoril; and Tramadol  Home Medications   Prior to Admission medications   Medication Sig Start Date End Date Taking? Authorizing Provider  albuterol (PROVENTIL HFA;VENTOLIN HFA) 108 (90 BASE) MCG/ACT inhaler Inhale 2 puffs into the lungs every 6 (six) hours as needed for wheezing or shortness of breath.    Historical  Provider, MD  clindamycin (CLEOCIN) 150 MG capsule Take 3 capsules (450 mg total) by mouth 3 (three) times daily. May dispense as 150mg  capsules 03/26/14   Elwyn Lade, PA-C  gabapentin (NEURONTIN) 300 MG capsule Take 600 mg by mouth 3 (three) times daily.    Historical Provider, MD  ondansetron (ZOFRAN) 4 MG tablet Take 1 tablet (4 mg total) by mouth every 6 (six) hours. 03/26/14   Elwyn Lade, PA-C  oxyCODONE-acetaminophen (PERCOCET/ROXICET) 5-325 MG per tablet Take 1 tablet by mouth every 4 (four) hours as needed for severe pain. May take 2 tablets PO q 6 hours for severe pain - Do not take with Tylenol as this tablet already contains tylenol 03/26/14   Elwyn Lade, PA-C  polyethylene glycol (MIRALAX / GLYCOLAX) packet Take 17 g by mouth daily as needed for mild constipation.    Historical Provider, MD  sulfamethoxazole-trimethoprim (SEPTRA DS) 800-160 MG per tablet Take 1 tablet by mouth every 12 (twelve) hours. 03/26/14   Elwyn Lade, PA-C   BP 90/68 mmHg  Pulse 124  Temp(Src) 97.6 F (36.4 C) (Oral)  Resp 20  Ht 5\' 2"  (1.575 m)  Wt 180 lb (81.647 kg)  BMI 32.91 kg/m2  SpO2 93% Physical Exam  Constitutional: She is oriented to person, place, and time. She appears well-developed and well-nourished.  HENT:  Head: Normocephalic.  Neck: Normal range of motion. Neck supple.  Cardiovascular: Normal rate and regular rhythm.   Pulmonary/Chest: She is in respiratory distress. She has wheezes.  Actively coughing. Patient is markedly tachypneic, in moderate distress.   Abdominal: Soft. Bowel sounds are normal. There is no tenderness. There is no rebound and no guarding.  Musculoskeletal: Normal range of motion.  Neurological: She is alert and oriented to person, place, and time.  Skin: Skin is warm and dry. No rash noted.  Psychiatric: She has a normal mood and affect.    ED Course  Procedures (including critical care time) Labs Review Labs Reviewed - No data to  display  Imaging Review No results found.   EKG Interpretation None     Results for orders placed or performed during the hospital encounter of 09/23/14  Troponin I  Result Value Ref Range   Troponin I <0.03 <0.031 ng/mL  CBC with Differential  Result Value Ref Range   WBC 5.9 4.0 - 10.5 K/uL   RBC 4.02 3.87 - 5.11 MIL/uL   Hemoglobin 13.1 12.0 - 15.0 g/dL   HCT 39.4 36.0 - 46.0 %   MCV 98.0 78.0 - 100.0 fL   MCH 32.6 26.0 - 34.0 pg   MCHC 33.2 30.0 - 36.0 g/dL   RDW 13.4 11.5 - 15.5 %  Platelets 476 (H) 150 - 400 K/uL   Neutrophils Relative % 55 43 - 77 %   Neutro Abs 3.3 1.7 - 7.7 K/uL   Lymphocytes Relative 31 12 - 46 %   Lymphs Abs 1.8 0.7 - 4.0 K/uL   Monocytes Relative 12 3 - 12 %   Monocytes Absolute 0.7 0.1 - 1.0 K/uL   Eosinophils Relative 1 0 - 5 %   Eosinophils Absolute 0.1 0.0 - 0.7 K/uL   Basophils Relative 1 0 - 1 %   Basophils Absolute 0.1 0.0 - 0.1 K/uL  Blood gas, arterial ([facility] ONLY)  Result Value Ref Range   FIO2 0.21 %   pH, Arterial 7.377 7.350 - 7.450   pCO2 arterial 42.9 35.0 - 45.0 mmHg   pO2, Arterial 53.3 (L) 80.0 - 100.0 mmHg   Bicarbonate 24.8 (H) 20.0 - 24.0 mEq/L   TCO2 22.3 0 - 100 mmol/L   Acid-base deficit 0.1 0.0 - 2.0 mmol/L   O2 Saturation 89.6 %   Patient temperature 97.6    Collection site RIGHT RADIAL    Drawn by [phone]    Sample type ARTERIAL    Allens test (pass/fail) PASS PASS  I-stat Chem 8, ED  Result Value Ref Range   Sodium 136 135 - 145 mmol/L   Potassium 3.5 3.5 - 5.1 mmol/L   Chloride 101 96 - 112 mmol/L   BUN 15 6 - 23 mg/dL   Creatinine, Ser 0.90 0.50 - 1.10 mg/dL   Glucose, Bld 89 70 - 99 mg/dL   Calcium, Ion 1.07 (L) 1.12 - 1.23 mmol/L   TCO2 26 0 - 100 mmol/L   Hemoglobin 13.9 12.0 - 15.0 g/dL   HCT 41.0 36.0 - 46.0 %  POC Urine Pregnancy, ED (do NOT order at [facility])  Result Value Ref Range   Preg Test, Ur NEGATIVE NEGATIVE   Dg Neck Soft Tissue  09/23/2014   CLINICAL DATA:  Shortness of breath.   Cough.  EXAM: NECK SOFT TISSUES - 1+ VIEW  COMPARISON:  None.  FINDINGS: There is no evidence of retropharyngeal soft tissue swelling or epiglottic enlargement. The cervical airway is unremarkable and no radio-opaque foreign body identified.  IMPRESSION: No abnormality seen in the soft tissues of the neck.   Electronically Signed   By: Marijo Conception, M.D.   On: 09/23/2014 08:09   Ct Angio Chest Pe W/cm &/or Wo Cm  09/23/2014   CLINICAL DATA:  Acute onset of shortness of breath for 1 hour.  EXAM: CT ANGIOGRAPHY CHEST WITH CONTRAST  TECHNIQUE: Multidetector CT imaging of the chest was performed using the standard protocol during bolus administration of intravenous contrast. Multiplanar CT image reconstructions and MIPs were obtained to evaluate the vascular anatomy.  CONTRAST:  11mL OMNIPAQUE IOHEXOL 350 MG/ML SOLN  COMPARISON:  Radiograph 03/26/2014.  CT 10/25/2013  FINDINGS: There are no filling defects within the pulmonary arteries to suggest pulmonary embolus.  The thoracic aorta is normal in caliber, no evidence of dissection. The heart is at the upper limits of normal in size. There is no mediastinal or hilar adenopathy.  Patient is post right mastectomy and axillary lymph node dissection. There is no right axillary adenopathy. No left axillary adenopathy.  Reticulation in the right upper and right middle lobes anteriorly consistent with postradiation change. The previous tiny less than 5 mm pulmonary nodules in the periphery of the left lower lobe are not significantly changed, some are obscured by associated atelectasis. The  tiny nodule in the periphery of the right lower lobe is unchanged. No new nodule. Mild hypoventilatory changes at the lung bases. No consolidation to suggest pneumonia.  No acute abnormality in the included upper abdomen. Postcholecystectomy change. 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  There are no lytic or blastic osseous lesions. Mild  degenerative change in the thoracic spine. Left chest port in place, tip in the SVC.  Review of the MIP images confirms the above findings.  IMPRESSION: 1. No pulmonary embolus. 2. Mild hypoventilatory change at the lung bases. There is otherwise no acute intrathoracic abnormality. 3. Small tiny nodules in the periphery of the right and left lower lobes are unchanged.   Electronically Signed   By: Jeb Levering M.D.   On: 09/23/2014 06:00    MDM   Final diagnoses:  None  1. Allergic reaction  IV started immediately, IV Solumedrol, Pepcid, Albuterol ordered. She continues to appear in moderate respiratory distress while maintaining 100% room air saturation.   CTA negative for PE. Additional medications ordered - Xanax (patient refused secondary to "allergy"); GI cocktail/protonix. She was found to desaturate into the 70's/80's which improved with oxygen. Will give Albuterol/Atrovent treatment and recheck O2 saturation.   Dr. Randal Buba directly involved in patient care and has also written full note. Patient care left with Dr. Randal Buba at end of shift for re-evaluation and disposition.  Dewaine Oats, PA-C 09/23/14 2017

## 2014-09-23 NOTE — ED Notes (Signed)
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

## 2014-09-23 NOTE — ED Notes (Signed)
Patient left against medical advice, pt aware of risks of leaving, pt informed that staff is concerned that she is having SOB and her oxygen saturation spontaneously decreases and cause has not been found. Patient instructed to return as soon as possible and not to hesitate to call for ambulance if SOB becomes more severe. Pt agrees to return as soon as possible to the ED and states she still must leave.

## 2014-09-24 ENCOUNTER — Encounter (HOSPITAL_COMMUNITY): Payer: Self-pay

## 2014-09-24 ENCOUNTER — Inpatient Hospital Stay (HOSPITAL_COMMUNITY)
Admission: EM | Admit: 2014-09-24 | Discharge: 2014-09-28 | DRG: 194 | Disposition: A | Discharge status: Home / Self Care | Payer: Medicare Other | Attending: Internal Medicine | Admitting: Internal Medicine

## 2014-09-24 ENCOUNTER — Emergency Department (HOSPITAL_COMMUNITY): Payer: Medicare Other

## 2014-09-24 DIAGNOSIS — Z923 Personal history of irradiation: Secondary | ICD-10-CM

## 2014-09-24 DIAGNOSIS — Z9049 Acquired absence of other specified parts of digestive tract: Secondary | ICD-10-CM | POA: Diagnosis present

## 2014-09-24 DIAGNOSIS — Z853 Personal history of malignant neoplasm of breast: Secondary | ICD-10-CM

## 2014-09-24 DIAGNOSIS — R197 Diarrhea, unspecified: Secondary | ICD-10-CM

## 2014-09-24 DIAGNOSIS — J209 Acute bronchitis, unspecified: Secondary | ICD-10-CM

## 2014-09-24 DIAGNOSIS — K921 Melena: Secondary | ICD-10-CM | POA: Diagnosis present

## 2014-09-24 DIAGNOSIS — Z800 Family history of malignant neoplasm of digestive organs: Secondary | ICD-10-CM

## 2014-09-24 DIAGNOSIS — J189 Pneumonia, unspecified organism: Secondary | ICD-10-CM | POA: Diagnosis present

## 2014-09-24 DIAGNOSIS — Z8049 Family history of malignant neoplasm of other genital organs: Secondary | ICD-10-CM

## 2014-09-24 DIAGNOSIS — R509 Fever, unspecified: Secondary | ICD-10-CM | POA: Diagnosis present

## 2014-09-24 DIAGNOSIS — Z803 Family history of malignant neoplasm of breast: Secondary | ICD-10-CM

## 2014-09-24 DIAGNOSIS — J040 Acute laryngitis: Secondary | ICD-10-CM

## 2014-09-24 DIAGNOSIS — IMO0001 Reserved for inherently not codable concepts without codable children: Secondary | ICD-10-CM

## 2014-09-24 DIAGNOSIS — Z9011 Acquired absence of right breast and nipple: Secondary | ICD-10-CM | POA: Diagnosis present

## 2014-09-24 DIAGNOSIS — Z79899 Other long term (current) drug therapy: Secondary | ICD-10-CM

## 2014-09-24 DIAGNOSIS — E876 Hypokalemia: Secondary | ICD-10-CM | POA: Diagnosis present

## 2014-09-24 DIAGNOSIS — G629 Polyneuropathy, unspecified: Secondary | ICD-10-CM | POA: Diagnosis present

## 2014-09-24 DIAGNOSIS — H9190 Unspecified hearing loss, unspecified ear: Secondary | ICD-10-CM | POA: Diagnosis present

## 2014-09-24 DIAGNOSIS — Z8541 Personal history of malignant neoplasm of cervix uteri: Secondary | ICD-10-CM

## 2014-09-24 DIAGNOSIS — Z808 Family history of malignant neoplasm of other organs or systems: Secondary | ICD-10-CM

## 2014-09-24 DIAGNOSIS — R0789 Other chest pain: Secondary | ICD-10-CM | POA: Diagnosis present

## 2014-09-24 DIAGNOSIS — J219 Acute bronchiolitis, unspecified: Secondary | ICD-10-CM | POA: Diagnosis present

## 2014-09-24 LAB — BASIC METABOLIC PANEL
ANION GAP: 8 (ref 5–15)
BUN: 15 mg/dL (ref 6–23)
CO2: 27 mmol/L (ref 19–32)
Calcium: 8.1 mg/dL — ABNORMAL LOW (ref 8.4–10.5)
Chloride: 103 mmol/L (ref 96–112)
Creatinine, Ser: 0.84 mg/dL (ref 0.50–1.10)
GFR calc Af Amer: >90 mL/min (ref >90)
GFR calc non Af Amer: 83 mL/min — ABNORMAL LOW (ref >90)
Glucose, Bld: 109 mg/dL — ABNORMAL HIGH (ref 70–99)
POTASSIUM: 3 mmol/L — AB (ref 3.5–5.1)
Sodium: 138 mmol/L (ref 135–145)

## 2014-09-24 LAB — I-STAT TROPONIN, ED: TROPONIN I, POC: 0 ng/mL (ref 0.00–0.08)

## 2014-09-24 LAB — CBC
HCT: 41 % (ref 36.0–46.0)
Hemoglobin: 13.4 g/dL (ref 12.0–15.0)
MCH: 32.5 pg (ref 26.0–34.0)
MCHC: 32.7 g/dL (ref 30.0–36.0)
MCV: 99.5 fL (ref 78.0–100.0)
Platelets: 454 10*3/uL — ABNORMAL HIGH (ref 150–400)
RBC: 4.12 MIL/uL (ref 3.87–5.11)
RDW: 14 % (ref 11.5–15.5)
WBC: 9.7 10*3/uL (ref 4.0–10.5)

## 2014-09-24 MED ORDER — CLARITHROMYCIN 500 MG PO TABS
500.0000 mg | ORAL_TABLET | Freq: Two times a day (BID) | ORAL | Status: DC
  Filled 2014-09-24: qty 1

## 2014-09-24 MED ORDER — CLARITHROMYCIN 500 MG PO TABS: 500.0000 mg | ORAL_TABLET | Freq: Two times a day (BID) | ORAL | Status: DC

## 2014-09-24 MED ORDER — BENZONATATE 100 MG PO CAPS: 100.0000 mg | ORAL_CAPSULE | Freq: Three times a day (TID) | ORAL | Status: DC

## 2014-09-24 MED ORDER — POTASSIUM CHLORIDE CRYS ER 20 MEQ PO TBCR
40.0000 meq | EXTENDED_RELEASE_TABLET | Freq: Once | ORAL | Status: DC
  Filled 2014-09-24 (×2): qty 2

## 2014-09-24 MED ORDER — DIPHENHYDRAMINE HCL 50 MG/ML IJ SOLN
25.0000 mg | Freq: Once | INTRAMUSCULAR | Status: AC
  Administered 2014-09-24: 25 mg via INTRAVENOUS
  Filled 2014-09-24: qty 1

## 2014-09-24 MED ORDER — SODIUM CHLORIDE 0.9 % IV SOLN
INTRAVENOUS | Status: DC
Start: 2014-09-24 — End: 2014-09-28
  Administered 2014-09-24: 125 mL/h via INTRAVENOUS
  Administered 2014-09-25 – 2014-09-28 (×5): via INTRAVENOUS

## 2014-09-24 MED ORDER — RACEPINEPHRINE HCL 2.25 % IN NEBU
0.5000 mL | INHALATION_SOLUTION | Freq: Once | RESPIRATORY_TRACT | Status: AC
  Administered 2014-09-24: 0.5 mL via RESPIRATORY_TRACT
  Filled 2014-09-24: qty 0.5

## 2014-09-24 MED ORDER — DEXAMETHASONE SODIUM PHOSPHATE 10 MG/ML IJ SOLN
10.0000 mg | Freq: Once | INTRAMUSCULAR | Status: AC
  Administered 2014-09-24: 10 mg via INTRAVENOUS
  Filled 2014-09-24: qty 1

## 2014-09-24 MED ORDER — LORAZEPAM 2 MG/ML IJ SOLN
1.0000 mg | Freq: Once | INTRAMUSCULAR | Status: AC
  Administered 2014-09-24: 1 mg via INTRAVENOUS
  Filled 2014-09-24: qty 1

## 2014-09-24 NOTE — ED Notes (Addendum)
Pt c/O SOB and chest pain w/ coughing x 2 days and diarrhea x 1 day.  Pain score 6/10.  Pt reports she was seen at [facility] x 1 day ago, but left AMA.  STS she went to [facility] yesterday evening, was diagnosed aw/ bronchitis, and started on clindamycin.  Congested cough noted.  Pt seems to be anxious and began hyperventilating during assessment.  O2 Sat 99-100%

## 2014-09-24 NOTE — ED Provider Notes (Addendum)
CSN: [id number]     Arrival date & time 09/24/14  1617 History   First MD Initiated Contact with Patient 09/24/14 1949     Chief Complaint  Patient presents with  . Shortness of Breath    HPI Patient presents to the emergency room with complaints of shortness of breath. The patient started having symptoms a few days ago. It started with coughing. She was also having a sore throat and laryngitis. She was seen in the emergency department a few days ago. She had an evaluation that included CT scan of the chest. She did not have any evidence of pneumonia. Or pulmonary embolism. Patient was told she might be having an allergic reaction. There was some discussion about having her admitted to the hospital. The patient ended up leaving. She was evaluated at another hospital yesterday and was told she might have bronchitis. She was given a prescription for clindamycin.  The patient was at work today when she started having recurrent episodes of coughing and shortness of breath. She could not catch her breath. She was at work she thought she maybe passed out in the bathroom. Past Medical History  Diagnosis Date  . Lupus   . Joint pain   . Hearing loss   . Bilateral swelling of feet   . Asthma   . Neuromuscular disorder   . Migraines   . Headache(784.0)   . Arthritis   . Allergy   . Peripheral neuropathy     bilateral feet  . Hx of radiation therapy 01/25/13- 03/18/13    right chest wall/regional lymph nodes 5040 cGy 28 sessions, right mastectomy scar boost 1000 cGy 5 sessions  . Breast cancer 40  . Cancer 37    Cervical, treated with cryoablation  . Breast cancer    Past Surgical History  Procedure Laterality Date  . Tonsillectomy    . Cholecystectomy    . 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 Portacath placement  03/11/2012    Procedure: INSERTION PORT-A-CATH;  Surgeon: Rolm Bookbinder, MD;  Location: [place];  Service: General;  Laterality: Left;  . Hand surgery  1997   . Appendectomy  1995  . Abdominal exploration surgery  [id number]  . Tubal ligation  1997  . Mastectomy modified radical Right 10/06/2012    Procedure: MASTECTOMY MODIFIED RADICAL WITH AXILLARY CONTENT;  Surgeon: Rolm Bookbinder, MD;  Location: [facility];  Service: General;  Laterality: Right;   Family History  Problem Relation Age of Onset  . Uterine cancer Mother 26  . Pancreatic cancer Paternal Uncle     diagnosed in his 65s; smoker; half uncle related through grandmother  . Cancer Paternal Uncle     Throat  . Liver cancer Maternal Grandmother     not a drinker  . Cancer Maternal Grandmother   . Breast cancer Cousin     3 paternal cousins with breast cancer, onset 78, 36 and one bilateral at  21 and 62  . Cancer Cousin     Breast  . Heart attack Father 53  . Breast cancer Paternal Grandmother 34  . Heart attack Paternal Grandfather   . Throat cancer Paternal Aunt     smoker; half uncle related through grandmother  . Breast cancer Paternal Aunt     2 paternal aunts, both with 2 diagnoses of breast cancer each  . Cystic fibrosis Cousin   . Cancer Cousin     Breast  . Cancer Cousin  Breast  . Breast cancer Paternal Aunt    History  Substance Use Topics  . Smoking status: Never Smoker   . Smokeless tobacco: Never Used  . Alcohol Use: No     Comment: occasional drinker   OB History    No data available     Review of Systems  All other systems reviewed and are negative.     Allergies  Bee venom; Hydrogen peroxide; Nsaids; Penicillins; Tetanus toxoids; Valium; Vitamin k and related; Banana; Nyquil multi-symptom; Restoril; and Tramadol  Home Medications   Prior to Admission medications   Medication Sig Start Date End Date Taking? Authorizing Provider  predniSONE (DELTASONE) 20 MG tablet 3 tabs po day one, then 2 po daily x 4 days 09/23/14  Yes April K Palumbo-Rasch, MD  albuterol (PROVENTIL HFA;VENTOLIN HFA) 108 (90 BASE) MCG/ACT inhaler Inhale 2 puffs into the  lungs every 6 (six) hours as needed for wheezing or shortness of breath.    Historical Provider, MD  clindamycin (CLEOCIN) 150 MG capsule Take 3 capsules (450 mg total) by mouth 3 (three) times daily. May dispense as 150mg  capsules Patient not taking: Reported on 09/23/2014 03/26/14   Elwyn Lade, PA-C  gabapentin (NEURONTIN) 300 MG capsule Take 900 mg by mouth 3 (three) times daily.     Historical Provider, MD  ondansetron (ZOFRAN) 4 MG tablet Take 1 tablet (4 mg total) by mouth every 6 (six) hours. Patient not taking: Reported on 09/23/2014 03/26/14   Elwyn Lade, PA-C  oxyCODONE (OXY IR/ROXICODONE) 5 MG immediate release tablet Take 5 mg by mouth every 4 (four) hours as needed for severe pain.    Historical Provider, MD  oxyCODONE-acetaminophen (PERCOCET/ROXICET) 5-325 MG per tablet Take 1 tablet by mouth every 4 (four) hours as needed for severe pain. May take 2 tablets PO q 6 hours for severe pain - Do not take with Tylenol as this tablet already contains tylenol Patient not taking: Reported on 09/23/2014 03/26/14   Elwyn Lade, PA-C  polyethylene glycol (MIRALAX / Floria Raveling) packet Take 17 g by mouth daily as needed for mild constipation.    Historical Provider, MD  predniSONE (DELTASONE) 5 MG tablet Take 5 mg by mouth daily with breakfast.    Historical Provider, MD  sulfamethoxazole-trimethoprim (SEPTRA DS) 800-160 MG per tablet Take 1 tablet by mouth every 12 (twelve) hours. Patient not taking: Reported on 09/23/2014 03/26/14   Elwyn Lade, PA-C   BP 123/79 mmHg  Pulse 111  Temp(Src) 99.8 F (37.7 C) (Oral)  Resp 24  SpO2 93%  LMP 09/17/2014 Physical Exam  Constitutional: She appears well-developed and well-nourished. No distress.  Anxious  HENT:  Head: Normocephalic and atraumatic.  Right Ear: External ear normal.  Left Ear: External ear normal.  Eyes: Conjunctivae are normal. Right eye exhibits no discharge. Left eye exhibits no discharge. No scleral icterus.  Neck:  Neck supple. No tracheal deviation present.  Cardiovascular: Normal rate, regular rhythm and intact distal pulses.   Pulmonary/Chest: Effort normal and breath sounds normal. No stridor. No respiratory distress. She has no wheezes. She has no rales.  Hyperventilating, intermittent inspiratory stridorous sounds, barking harsh cough  Abdominal: Soft. Bowel sounds are normal. She exhibits no distension. There is no tenderness. There is no rebound and no guarding.  Musculoskeletal: She exhibits no edema or tenderness.  Neurological: She is alert. She has normal strength. No cranial nerve deficit (no facial droop, extraocular movements intact, no slurred speech) or sensory deficit. She exhibits  normal muscle tone. She displays no seizure activity. Coordination normal.  Skin: Skin is warm and dry. No rash noted.  Psychiatric: She has a normal mood and affect.  Nursing note and vitals reviewed.   ED Course  Procedures (including critical care time) Labs Review Labs Reviewed  BASIC METABOLIC PANEL - Abnormal; Notable for the following:    Potassium 3.0 (*)    Glucose, Bld 109 (*)    Calcium 8.1 (*)    GFR calc non Af Amer 83 (*)    All other components within normal limits  CBC - Abnormal; Notable for the following:    Platelets 454 (*)    All other components within normal limits  I-STAT TROPOININ, ED    Imaging Review Dg Neck Soft Tissue  09/23/2014   CLINICAL DATA:  Shortness of breath.  Cough.  EXAM: NECK SOFT TISSUES - 1+ VIEW  COMPARISON:  None.  FINDINGS: There is no evidence of retropharyngeal soft tissue swelling or epiglottic enlargement. The cervical airway is unremarkable and no radio-opaque foreign body identified.  IMPRESSION: No abnormality seen in the soft tissues of the neck.   Electronically Signed   By: Marijo Conception, M.D.   On: 09/23/2014 08:09   Dg Chest 2 View (if Patient Has Fever And/or Copd)  09/24/2014   CLINICAL DATA:  Acute onset of shortness of breath for 3  days. Diarrhea. Initial encounter.  EXAM: CHEST  2 VIEW  COMPARISON:  Chest radiograph performed 03/26/2014, and CTA of the chest performed 09/23/2014  FINDINGS: The lungs are hypoexpanded. Mild vascular crowding and vascular congestion are seen. Retrocardiac opacity may reflect atelectasis or possibly mild pneumonia. No pleural effusion or pneumothorax is identified.  The heart is normal in size; the mediastinal contour is within normal limits. A left-sided chest port is noted ending about the mid SVC. No acute osseous abnormalities are seen. Clips are noted within the right upper quadrant, reflecting prior cholecystectomy.  IMPRESSION: Lungs hypoexpanded. Retrocardiac opacity may reflect atelectasis or possibly mild pneumonia.   Electronically Signed   By: Garald Balding M.D.   On: 09/24/2014 17:25   Ct Angio Chest Pe W/cm &/or Wo Cm  09/23/2014   CLINICAL DATA:  Acute onset of shortness of breath for 1 hour.  EXAM: CT ANGIOGRAPHY CHEST WITH CONTRAST  TECHNIQUE: Multidetector CT imaging of the chest was performed using the standard protocol during bolus administration of intravenous contrast. Multiplanar CT image reconstructions and MIPs were obtained to evaluate the vascular anatomy.  CONTRAST:  141mL OMNIPAQUE IOHEXOL 350 MG/ML SOLN  COMPARISON:  Radiograph 03/26/2014.  CT 10/25/2013  FINDINGS: There are no filling defects within the pulmonary arteries to suggest pulmonary embolus.  The thoracic aorta is normal in caliber, no evidence of dissection. The heart is at the upper limits of normal in size. There is no mediastinal or hilar adenopathy.  Patient is post right mastectomy and axillary lymph node dissection. There is no right axillary adenopathy. No left axillary adenopathy.  Reticulation in the right upper and right middle lobes anteriorly consistent with postradiation change. The previous tiny less than 5 mm pulmonary nodules in the periphery of the left lower lobe are not significantly changed, some  are obscured by associated atelectasis. The tiny nodule in the periphery of the right lower lobe is unchanged. No new nodule. Mild hypoventilatory changes at the lung bases. No consolidation to suggest pneumonia.  No acute abnormality in the included upper abdomen. Postcholecystectomy change. Post splenectomy change with minimal  splenosis in the left upper quadrant.  There are no lytic or blastic osseous lesions. Mild degenerative change in the thoracic spine. Left chest port in place, tip in the SVC.  Review of the MIP images confirms the above findings.  IMPRESSION: 1. No pulmonary embolus. 2. Mild hypoventilatory change at the lung bases. There is otherwise no acute intrathoracic abnormality. 3. Small tiny nodules in the periphery of the right and left lower lobes are unchanged.   Electronically Signed   By: Jeb Levering M.D.   On: 09/23/2014 06:00     MDM   Final diagnoses:  Laryngitis acute, spasmodic   Pt had a a recent CT without pna.  Doubt CXR today is showing pna.  Pt improved with treatment.  Will add on macrolide for the possibility of pertussis as she has been having a distinct cough.  No difficulty with breathing at rest.  Labs normal.  However when she wakes up and tries to talks she starts having paroxysms.  Discussed her immunization status.  Pt stats she is allergic to tetanus so has not been vaccinated.  Suspect she has not received her pertussis vaccination.  Will consult with hospitalist for admission further treatment because of her persistent resp symptoms  Dorie Rank, MD 09/24/14 2333

## 2014-09-24 NOTE — Discharge Instructions (Signed)

## 2014-09-25 ENCOUNTER — Encounter (HOSPITAL_COMMUNITY): Payer: Self-pay

## 2014-09-25 DIAGNOSIS — H9190 Unspecified hearing loss, unspecified ear: Secondary | ICD-10-CM | POA: Diagnosis present

## 2014-09-25 DIAGNOSIS — Z800 Family history of malignant neoplasm of digestive organs: Secondary | ICD-10-CM | POA: Diagnosis not present

## 2014-09-25 DIAGNOSIS — K921 Melena: Secondary | ICD-10-CM

## 2014-09-25 DIAGNOSIS — Z853 Personal history of malignant neoplasm of breast: Secondary | ICD-10-CM | POA: Diagnosis not present

## 2014-09-25 DIAGNOSIS — J209 Acute bronchitis, unspecified: Secondary | ICD-10-CM | POA: Diagnosis not present

## 2014-09-25 DIAGNOSIS — Z79899 Other long term (current) drug therapy: Secondary | ICD-10-CM | POA: Diagnosis not present

## 2014-09-25 DIAGNOSIS — Z8049 Family history of malignant neoplasm of other genital organs: Secondary | ICD-10-CM | POA: Diagnosis not present

## 2014-09-25 DIAGNOSIS — R509 Fever, unspecified: Secondary | ICD-10-CM | POA: Diagnosis present

## 2014-09-25 DIAGNOSIS — R197 Diarrhea, unspecified: Secondary | ICD-10-CM

## 2014-09-25 DIAGNOSIS — R0789 Other chest pain: Secondary | ICD-10-CM

## 2014-09-25 DIAGNOSIS — G629 Polyneuropathy, unspecified: Secondary | ICD-10-CM | POA: Diagnosis present

## 2014-09-25 DIAGNOSIS — J219 Acute bronchiolitis, unspecified: Secondary | ICD-10-CM | POA: Diagnosis present

## 2014-09-25 DIAGNOSIS — J189 Pneumonia, unspecified organism: Secondary | ICD-10-CM | POA: Diagnosis present

## 2014-09-25 DIAGNOSIS — Z803 Family history of malignant neoplasm of breast: Secondary | ICD-10-CM | POA: Diagnosis not present

## 2014-09-25 DIAGNOSIS — Z9011 Acquired absence of right breast and nipple: Secondary | ICD-10-CM | POA: Diagnosis present

## 2014-09-25 DIAGNOSIS — Z8541 Personal history of malignant neoplasm of cervix uteri: Secondary | ICD-10-CM | POA: Diagnosis not present

## 2014-09-25 DIAGNOSIS — Z808 Family history of malignant neoplasm of other organs or systems: Secondary | ICD-10-CM | POA: Diagnosis not present

## 2014-09-25 DIAGNOSIS — Z9049 Acquired absence of other specified parts of digestive tract: Secondary | ICD-10-CM | POA: Diagnosis present

## 2014-09-25 DIAGNOSIS — R0602 Shortness of breath: Secondary | ICD-10-CM | POA: Diagnosis present

## 2014-09-25 DIAGNOSIS — E876 Hypokalemia: Secondary | ICD-10-CM | POA: Diagnosis present

## 2014-09-25 DIAGNOSIS — Z923 Personal history of irradiation: Secondary | ICD-10-CM | POA: Diagnosis not present

## 2014-09-25 LAB — CBC
HCT: 38.9 % (ref 36.0–46.0)
Hemoglobin: 12.9 g/dL (ref 12.0–15.0)
MCH: 32.7 pg (ref 26.0–34.0)
MCHC: 33.2 g/dL (ref 30.0–36.0)
MCV: 98.5 fL (ref 78.0–100.0)
Platelets: 447 10*3/uL — ABNORMAL HIGH (ref 150–400)
RBC: 3.95 MIL/uL (ref 3.87–5.11)
RDW: 13.9 % (ref 11.5–15.5)
WBC: 7.7 10*3/uL (ref 4.0–10.5)

## 2014-09-25 LAB — TROPONIN I
Troponin I: <0.03 ng/mL (ref <0.031)
Troponin I: <0.03 ng/mL (ref <0.031)

## 2014-09-25 LAB — BASIC METABOLIC PANEL
Anion gap: 7 (ref 5–15)
BUN: 10 mg/dL (ref 6–23)
CHLORIDE: 107 mmol/L (ref 96–112)
CO2: 25 mmol/L (ref 19–32)
CREATININE: 0.6 mg/dL (ref 0.50–1.10)
Calcium: 8.1 mg/dL — ABNORMAL LOW (ref 8.4–10.5)
GFR calc non Af Amer: >90 mL/min (ref >90)
Glucose, Bld: 122 mg/dL — ABNORMAL HIGH (ref 70–99)
Potassium: 3.7 mmol/L (ref 3.5–5.1)
SODIUM: 139 mmol/L (ref 135–145)

## 2014-09-25 LAB — LACTIC ACID, PLASMA
LACTIC ACID, VENOUS: 0.9 mmol/L (ref 0.5–2.0)
Lactic Acid, Venous: 1.3 mmol/L (ref 0.5–2.0)

## 2014-09-25 LAB — MAGNESIUM: Magnesium: 2.3 mg/dL (ref 1.5–2.5)

## 2014-09-25 LAB — HIV ANTIBODY (ROUTINE TESTING W REFLEX): HIV SCREEN 4TH GENERATION: NONREACTIVE

## 2014-09-25 LAB — MRSA PCR SCREENING: MRSA BY PCR: NEGATIVE

## 2014-09-25 LAB — STREP PNEUMONIAE URINARY ANTIGEN: Strep Pneumo Urinary Antigen: NEGATIVE

## 2014-09-25 MED ORDER — ACETAMINOPHEN 650 MG RE SUPP: 650.0000 mg | Freq: Four times a day (QID) | RECTAL | Status: DC | PRN

## 2014-09-25 MED ORDER — ONDANSETRON HCL 4 MG/2ML IJ SOLN: 4.0000 mg | Freq: Four times a day (QID) | INTRAMUSCULAR | Status: DC | PRN

## 2014-09-25 MED ORDER — ONDANSETRON HCL 4 MG PO TABS
4.0000 mg | ORAL_TABLET | Freq: Four times a day (QID) | ORAL | Status: DC | PRN
  Administered 2014-09-28: 4 mg via ORAL
  Filled 2014-09-25: qty 1

## 2014-09-25 MED ORDER — GUAIFENESIN ER 600 MG PO TB12
600.0000 mg | ORAL_TABLET | Freq: Two times a day (BID) | ORAL | Status: DC
  Administered 2014-09-25 – 2014-09-28 (×6): 600 mg via ORAL
  Filled 2014-09-25 (×6): qty 1

## 2014-09-25 MED ORDER — OXYCODONE HCL 5 MG PO TABS
5.0000 mg | ORAL_TABLET | ORAL | Status: DC | PRN
  Administered 2014-09-25 – 2014-09-28 (×9): 5 mg via ORAL
  Filled 2014-09-25 (×10): qty 1

## 2014-09-25 MED ORDER — AZITHROMYCIN 250 MG PO TABS
250.0000 mg | ORAL_TABLET | Freq: Every day | ORAL | Status: DC
  Administered 2014-09-25 – 2014-09-28 (×4): 250 mg via ORAL
  Filled 2014-09-25 (×4): qty 1

## 2014-09-25 MED ORDER — CETYLPYRIDINIUM CHLORIDE 0.05 % MT LIQD
7.0000 mL | Freq: Two times a day (BID) | OROMUCOSAL | Status: DC
  Administered 2014-09-25 – 2014-09-28 (×6): 7 mL via OROMUCOSAL

## 2014-09-25 MED ORDER — SODIUM CHLORIDE 0.9 % IJ SOLN
10.0000 mL | INTRAMUSCULAR | Status: DC | PRN
  Administered 2014-09-26 – 2014-09-28 (×3): 10 mL
  Filled 2014-09-25 (×3): qty 40

## 2014-09-25 MED ORDER — SODIUM CHLORIDE 0.9 % IJ SOLN
3.0000 mL | Freq: Two times a day (BID) | INTRAMUSCULAR | Status: DC
  Administered 2014-09-25 – 2014-09-26 (×3): 3 mL via INTRAVENOUS

## 2014-09-25 MED ORDER — ENOXAPARIN SODIUM 40 MG/0.4ML ~~LOC~~ SOLN
40.0000 mg | Freq: Every day | SUBCUTANEOUS | Status: DC
  Administered 2014-09-25 – 2014-09-27 (×4): 40 mg via SUBCUTANEOUS
  Filled 2014-09-25 (×4): qty 0.4

## 2014-09-25 MED ORDER — MORPHINE SULFATE 2 MG/ML IJ SOLN
1.0000 mg | Freq: Once | INTRAMUSCULAR | Status: AC
Start: 2014-09-25 — End: 2014-09-25
  Administered 2014-09-25: 1 mg via INTRAVENOUS
  Filled 2014-09-25: qty 1

## 2014-09-25 MED ORDER — LEVOFLOXACIN IN D5W 750 MG/150ML IV SOLN
750.0000 mg | Freq: Every day | INTRAVENOUS | Status: DC
  Administered 2014-09-25 – 2014-09-27 (×3): 750 mg via INTRAVENOUS
  Filled 2014-09-25 (×3): qty 150

## 2014-09-25 MED ORDER — BENZONATATE 100 MG PO CAPS
200.0000 mg | ORAL_CAPSULE | Freq: Three times a day (TID) | ORAL | Status: DC | PRN
  Administered 2014-09-25 – 2014-09-26 (×2): 200 mg via ORAL
  Filled 2014-09-25 (×2): qty 2

## 2014-09-25 MED ORDER — ACETAMINOPHEN 325 MG PO TABS: 650.0000 mg | ORAL_TABLET | Freq: Four times a day (QID) | ORAL | Status: DC | PRN

## 2014-09-25 MED ORDER — SODIUM CHLORIDE 0.9 % IJ SOLN
10.0000 mL | Freq: Two times a day (BID) | INTRAMUSCULAR | Status: DC
  Administered 2014-09-28: 10 mL

## 2014-09-25 MED ORDER — INFLUENZA VAC SPLIT QUAD 0.5 ML IM SUSY
0.5000 mL | PREFILLED_SYRINGE | INTRAMUSCULAR | Status: DC
  Filled 2014-09-25 (×2): qty 0.5

## 2014-09-25 MED ORDER — GABAPENTIN 300 MG PO CAPS
900.0000 mg | ORAL_CAPSULE | Freq: Once | ORAL | Status: AC
Start: 2014-09-25 — End: 2014-09-25
  Administered 2014-09-25: 900 mg via ORAL
  Filled 2014-09-25: qty 3

## 2014-09-25 MED ORDER — AZITHROMYCIN 250 MG PO TABS
500.0000 mg | ORAL_TABLET | Freq: Once | ORAL | Status: AC
  Administered 2014-09-25: 500 mg via ORAL
  Filled 2014-09-25: qty 2

## 2014-09-25 MED ORDER — HYDROCODONE-HOMATROPINE 5-1.5 MG/5ML PO SYRP
5.0000 mL | ORAL_SOLUTION | ORAL | Status: DC | PRN
  Administered 2014-09-25 (×3): 5 mL via ORAL
  Filled 2014-09-25 (×3): qty 5

## 2014-09-25 MED ORDER — HYDROCOD POLST-CHLORPHEN POLST 10-8 MG/5ML PO LQCR
5.0000 mL | Freq: Two times a day (BID) | ORAL | Status: DC | PRN
  Administered 2014-09-25: 5 mL via ORAL
  Filled 2014-09-25: qty 5

## 2014-09-25 MED ORDER — ALBUTEROL SULFATE (2.5 MG/3ML) 0.083% IN NEBU: 2.5000 mg | INHALATION_SOLUTION | RESPIRATORY_TRACT | Status: DC | PRN

## 2014-09-25 MED ORDER — GABAPENTIN 300 MG PO CAPS
900.0000 mg | ORAL_CAPSULE | Freq: Three times a day (TID) | ORAL | Status: DC
  Administered 2014-09-25 – 2014-09-28 (×10): 900 mg via ORAL
  Filled 2014-09-25 (×10): qty 3

## 2014-09-25 MED ORDER — SODIUM CHLORIDE 0.9 % IV SOLN
INTRAVENOUS | Status: DC
Start: 2014-09-25 — End: 2014-09-25
  Administered 2014-09-25: 02:00:00 via INTRAVENOUS
  Filled 2014-09-25 (×3): qty 1000

## 2014-09-25 MED ORDER — MORPHINE SULFATE 2 MG/ML IJ SOLN
1.0000 mg | INTRAMUSCULAR | Status: DC | PRN
  Administered 2014-09-25 – 2014-09-27 (×13): 2 mg via INTRAVENOUS
  Filled 2014-09-25 (×13): qty 1

## 2014-09-25 MED ORDER — PREDNISONE 20 MG PO TABS
60.0000 mg | ORAL_TABLET | Freq: Two times a day (BID) | ORAL | Status: DC
  Administered 2014-09-25 – 2014-09-27 (×6): 60 mg via ORAL
  Filled 2014-09-25 (×6): qty 3

## 2014-09-25 NOTE — H&P (Signed)
History and Physical  Holly Parrish PJA:[id number] DOB: 01/06/1971 DOA: 09/24/2014   PCP: Odette Fraction, MD   Chief Complaint: Coughing and shortness of breath  HPI:  44 year old female with a history of breast cancer and cervical cancer, peripheral neuropathy, migraine headache presents with 3 day history of worsening coughing and shortness of breath. The patient has been having paroxysms of coughing to the point where she vomits and had a syncopal episode on the day of admission. She states that she has been having fevers up to 103.12F at home. In addition, the patient has had some intermittent episodes of hemoptysis with her vigorous coughing. She's been having chest discomfort as a result of coughing with associated shortness of breath. The patient does not smoke. She denies any recent travels or sick contacts. 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 She states that she feels mostly with domesticated animals but occasionally has some birds and Curator. In addition, the patient began having diarrhea the last 24 hours. She states that she has had over 10 bowel movements with some associated hematochezia. She denies any melena or abdominal pain. She denies any hematemesis. She denies eating any unusual or raw or undercooked foods. The patient this to the emergency department on 09/23/2014. The patient was sent home with clindamycin and prednisone. She began taking the prednisone which did not help. She came back on 09/24/2014 with worsening cough.   In the emergency department, potassium was 3.0. Otherwise BMP was unremarkable. CBC showed WBC 9.7, hemoglobin 13.4, platelets 454,000. EKG on 09/23/2014 shows sinus tachycardia with nonspecific ST changes. Urine pregnancy test was negative. Chest x-ray was poor inspiration showed possible retrocardiac opacity. 09/23/2014 CT angiogram chest was negative for pulmonary embolus.  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 Jana Hakim -now seeking another opinion at [facility]       Past Medical History  Diagnosis Date  . Lupus   . Joint pain   . Hearing loss   . Bilateral swelling of feet   . Asthma   . Neuromuscular disorder   . Migraines   . Headache(784.0)   . Arthritis   . Allergy   . Peripheral neuropathy     bilateral feet  . Hx of radiation therapy 01/25/13- 03/18/13    right chest wall/regional lymph nodes 5040 cGy 28 sessions, right mastectomy scar boost 1000 cGy 5 sessions  . Breast cancer 40  . Cancer 37    Cervical, treated with cryoablation  . Breast cancer    Past Surgical History  Procedure Laterality Date  . Tonsillectomy    . Cholecystectomy    . 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 Portacath placement  03/11/2012    Procedure: INSERTION PORT-A-CATH;  Surgeon: Rolm Bookbinder, MD;  Location: [place];  Service: General;  Laterality: Left;  .  Hand surgery  1997  . Appendectomy  1995  . Abdominal exploration surgery  [id number]  . Tubal ligation  1997  . Mastectomy modified radical Right 10/06/2012    Procedure: MASTECTOMY MODIFIED RADICAL WITH  AXILLARY CONTENT;  Surgeon: Rolm Bookbinder, MD;  Location: [facility];  Service: General;  Laterality: Right;   Social History:  reports that she has never smoked. She has never used smokeless tobacco. She reports that she does not drink alcohol or use illicit drugs.   Family History  Problem Relation Age of Onset  . Uterine cancer Mother 18  . Pancreatic cancer Paternal Uncle     diagnosed in his 86s; smoker; half uncle related through grandmother  . Cancer Paternal Uncle     Throat  . Liver cancer Maternal Grandmother     not a drinker  . Cancer Maternal Grandmother   . Breast cancer Cousin     3 paternal cousins with breast cancer, onset 29, 88 and one bilateral at  62 and 59  . Cancer Cousin     Breast  . Heart attack Father 69  . Breast cancer Paternal Grandmother 66  . Heart attack Paternal Grandfather   . Throat cancer Paternal Aunt     smoker; half uncle related through grandmother  . Breast cancer Paternal Aunt     2 paternal aunts, both with 2 diagnoses of breast cancer each  . Cystic fibrosis Cousin   . Cancer Cousin     Breast  . Cancer Cousin     Breast  . Breast cancer Paternal Aunt      Allergies  Allergen Reactions  . Bee Venom Anaphylaxis  . Hydrogen Peroxide Other (See Comments)    "It burns until I bleed."  . Nsaids Anaphylaxis  . Penicillins Anaphylaxis  . Tetanus Toxoids Anaphylaxis  . Valium [Diazepam] Anaphylaxis  . Vitamin K And Related Anaphylaxis  . Banana Other (See Comments)    Face swells  . Nyquil Multi-Symptom [Pseudoeph-Doxylamine-Dm-Apap]     "makes me crazy"  . Restoril [Temazepam] Other (See Comments)    nightmares  . Tramadol Other (See Comments)    tremors      Prior to Admission medications   Medication Sig Start Date End Date Taking? Authorizing Provider  clindamycin (CLEOCIN) 150 MG capsule Take 3 capsules (450 mg total) by mouth 3 (three) times daily. May dispense as 150mg  capsules 03/26/14  Yes Elwyn Lade, PA-C    gabapentin (NEURONTIN) 300 MG capsule Take 900 mg by mouth 3 (three) times daily.    Yes Historical Provider, MD  ondansetron (ZOFRAN) 4 MG tablet Take 1 tablet (4 mg total) by mouth every 6 (six) hours. 03/26/14  Yes Elwyn Lade, PA-C  oxyCODONE (OXY IR/ROXICODONE) 5 MG immediate release tablet Take 5 mg by mouth every 4 (four) hours as needed for severe pain.   Yes Historical Provider, MD  polyethylene glycol (MIRALAX / GLYCOLAX) packet Take 17 g by mouth daily as needed for mild constipation.   Yes Historical Provider, MD  predniSONE (DELTASONE) 20 MG tablet 3 tabs po day one, then 2 po daily x 4 days 09/23/14  Yes April K Palumbo-Rasch, MD  predniSONE (DELTASONE) 5 MG tablet Take 5 mg by mouth daily with breakfast.   Yes Historical Provider, MD  albuterol (PROVENTIL HFA;VENTOLIN HFA) 108 (90 BASE) MCG/ACT inhaler Inhale 2 puffs into the lungs every 6 (six) hours as needed for wheezing or shortness of breath.    Historical Provider, MD  benzonatate (TESSALON) 100 MG capsule Take 1 capsule (100 mg total) by mouth every 8 (eight) hours. 09/24/14   Dorie Rank, MD  clarithromycin (BIAXIN) 500 MG tablet Take 1 tablet (500 mg total) by mouth 2 (two) times daily. 09/24/14   Dorie Rank, MD  oxyCODONE-acetaminophen (PERCOCET/ROXICET) 5-325 MG per tablet Take 1 tablet by mouth every 4 (four) hours as needed for severe pain. May take 2 tablets PO q 6 hours for severe pain - Do not take with Tylenol as this tablet already contains tylenol Patient not taking: Reported on 09/24/2014 03/26/14   Elwyn Lade, PA-C  sulfamethoxazole-trimethoprim (SEPTRA DS) 800-160 MG per tablet Take 1 tablet by mouth every 12 (twelve) hours. Patient not taking: Reported on 09/23/2014 03/26/14   Elwyn Lade, PA-C    Review of Systems:  Constitutional:  No weight loss, night sweats, Head&Eyes: No headache.  No vision loss.  No eye pain or scotoma ENT:  No Difficulty swallowing,Tooth/dental problems,Sore throat,    Cardio-vascular:  No chest pain, Orthopnea, PND, swelling in lower extremities,  dizziness, palpitations  GI:  No nausea, vomiting, diarrhea, loss of appetite,  melena, heartburn, indigestion, Resp:  .No wheezing.No chest wall deformity  Skin:  no rash or lesions.  GU:  no dysuria, change in color of urine, no urgency or frequency. No flank pain.  Musculoskeletal:  No joint pain or swelling. No decreased range of motion. No back pain.  Psych:  No change in mood or affect.  Neurologic: No headache, no dysesthesia, no focal weakness, no vision loss.  Physical Exam: Filed Vitals:   09/24/14 1625 09/24/14 1950 09/24/14 2302  BP: 135/77 123/79 100/57  Pulse: 107 111 91  Temp: 99.8 F (37.7 C)    TempSrc: Oral    Resp: 24 24 22   SpO2: 98% 93% 95%   General:  A&O x 3, NAD, nontoxic, pleasant/cooperative Head/Eye: No conjunctival hemorrhage, no icterus, [place]/AT, No nystagmus ENT:  No icterus,  No thrush, good dentition, no pharyngeal exudate Neck:  No masses, no lymphadenpathy, no bruits CV:  RRR, no rub, no gallop, no S3 Lung:  Bibasilar crackles. No wheezing. Good air movement. Abdomen: soft/NT, +BS, nondistended, no peritoneal signs Ext: No cyanosis, No rashes, No petechiae, No lymphangitis, 1+LE edema   Labs on Admission:  Basic Metabolic Panel:  Recent Labs Lab 09/23/14 0455 09/24/14 1635  NA 136 138  K 3.5 3.0*  CL 101 103  CO2  --  27  GLUCOSE 89 109*  BUN 15 15  CREATININE 0.90 0.84  CALCIUM  --  8.1*   Liver Function Tests: No results for input(s): AST, ALT, ALKPHOS, BILITOT, PROT, ALBUMIN in the last 168 hours. No results for input(s): LIPASE, AMYLASE in the last 168 hours. No results for input(s): AMMONIA in the last 168 hours. CBC:  Recent Labs Lab 09/23/14 0455 09/23/14 0458 09/24/14 1635  WBC  --  5.9 9.7  NEUTROABS  --  3.3  --   HGB 13.9 13.1 13.4  HCT 41.0 39.4 41.0  MCV  --  98.0 99.5  PLT  --  476* 454*   Cardiac Enzymes:  Recent  Labs Lab 09/23/14 0458  TROPONINI <0.03   BNP: Invalid input(s): POCBNP CBG: No results for input(s): GLUCAP in the last 168 hours.  Radiological Exams on Admission: Dg Neck Soft Tissue  09/23/2014   CLINICAL DATA:  Shortness of breath.  Cough.  EXAM: NECK SOFT TISSUES - 1+ VIEW  COMPARISON:  None.  FINDINGS: There  is no evidence of retropharyngeal soft tissue swelling or epiglottic enlargement. The cervical airway is unremarkable and no radio-opaque foreign body identified.  IMPRESSION: No abnormality seen in the soft tissues of the neck.   Electronically Signed   By: Marijo Conception, M.D.   On: 09/23/2014 08:09   Dg Chest 2 View (if Patient Has Fever And/or Copd)  09/24/2014   CLINICAL DATA:  Acute onset of shortness of breath for 3 days. Diarrhea. Initial encounter.  EXAM: CHEST  2 VIEW  COMPARISON:  Chest radiograph performed 03/26/2014, and CTA of the chest performed 09/23/2014  FINDINGS: The lungs are hypoexpanded. Mild vascular crowding and vascular congestion are seen. Retrocardiac opacity may reflect atelectasis or possibly mild pneumonia. No pleural effusion or pneumothorax is identified.  The heart is normal in size; the mediastinal contour is within normal limits. A left-sided chest port is noted ending about the mid SVC. No acute osseous abnormalities are seen. Clips are noted within the right upper quadrant, reflecting prior cholecystectomy.  IMPRESSION: Lungs hypoexpanded. Retrocardiac opacity may reflect atelectasis or possibly mild pneumonia.   Electronically Signed   By: Garald Balding M.D.   On: 09/24/2014 17:25   Ct Angio Chest Pe W/cm &/or Wo Cm  09/23/2014   CLINICAL DATA:  Acute onset of shortness of breath for 1 hour.  EXAM: CT ANGIOGRAPHY CHEST WITH CONTRAST  TECHNIQUE: Multidetector CT imaging of the chest was performed using the standard protocol during bolus administration of intravenous contrast. Multiplanar CT image reconstructions and MIPs were obtained to evaluate  the vascular anatomy.  CONTRAST:  186mL OMNIPAQUE IOHEXOL 350 MG/ML SOLN  COMPARISON:  Radiograph 03/26/2014.  CT 10/25/2013  FINDINGS: There are no filling defects within the pulmonary arteries to suggest pulmonary embolus.  The thoracic aorta is normal in caliber, no evidence of dissection. The heart is at the upper limits of normal in size. There is no mediastinal or hilar adenopathy.  Patient is post right mastectomy and axillary lymph node dissection. There is no right axillary adenopathy. No left axillary adenopathy.  Reticulation in the right upper and right middle lobes anteriorly consistent with postradiation change. The previous tiny less than 5 mm pulmonary nodules in the periphery of the left lower lobe are not significantly changed, some are obscured by associated atelectasis. The tiny nodule in the periphery of the right lower lobe is unchanged. No new nodule. Mild hypoventilatory changes at the lung bases. No consolidation to suggest pneumonia.  No acute abnormality in the included upper abdomen. Postcholecystectomy change. 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  There are no lytic or blastic osseous lesions. Mild degenerative change in the thoracic spine. Left chest port in place, tip in the SVC.  Review of the MIP images confirms the above findings.  IMPRESSION: 1. No pulmonary embolus. 2. Mild hypoventilatory change at the lung bases. There is otherwise no acute intrathoracic abnormality. 3. Small tiny nodules in the periphery of the right and left lower lobes are unchanged.   Electronically Signed   By: Jeb Levering M.D.   On: 09/23/2014 06:00    EKG: Independently reviewed. pending    Time spent:60 minutes Code Status:   FULL Family Communication: No  Family at bedside   Raevin Wierenga, DO  [facility] Pager [phone]  If 7PM-7AM, please contact night-coverage [email or web address] Password [facility] 09/25/2014, 12:38 AM

## 2014-09-25 NOTE — Progress Notes (Signed)
[facility] PROGRESS NOTE  Holly Parrish WEX:[id number] DOB: 1971-04-30 DOA: 09/24/2014  PCP: Odette Fraction, MD  Brief HPI: 44 year old female with the history of breast cancer, migraine headaches, presented with cough and shortness of breath. She was found to have a lung infiltrate on her chest x-ray. She was admitted for further management of her pneumonia.  Past medical history:  Past Medical History  Diagnosis Date  . Lupus   . Joint pain   . Hearing loss   . Bilateral swelling of feet   . Asthma   . Neuromuscular disorder   . Migraines   . Headache(784.0)   . Arthritis   . Allergy   . Peripheral neuropathy     bilateral feet  . Hx of radiation therapy 01/25/13- 03/18/13    right chest wall/regional lymph nodes 5040 cGy 28 sessions, right mastectomy scar boost 1000 cGy 5 sessions  . Breast cancer 40  . Cancer 37    Cervical, treated with cryoablation  . Breast cancer     Consultants: None  Procedures: None  Antibiotics: Levaquin 2/29  Subjective: Patient continues to have a cough which is mostly dry. She has some wheezing. She admits to shortness of breath, some chest tightness.   Objective: Vital Signs  Filed Vitals:   09/24/14 1950 09/24/14 2302 09/25/14 0112 09/25/14 0457  BP: 123/79 100/57 110/70 111/67  Pulse: 111 91 92 67  Temp:   99.1 F (37.3 C) 98.3 F (36.8 C)  TempSrc:   Oral Oral  Resp: 24 22 22 18   Height:   5\' 2"  (1.575 m)   Weight:   92.035 kg (202 lb 14.4 oz)   SpO2: 93% 95% 96% 95%    Intake/Output Summary (Last 24 hours) at 09/25/14 1032 Last data filed at 09/25/14 1024  Gross per 24 hour  Intake  382.5 ml  Output   1002 ml  Net -619.5 ml   Filed Weights   09/25/14 0112  Weight: 92.035 kg (202 lb 14.4 oz)    General appearance: alert, cooperative, appears stated age and no distress Head: Normocephalic, without obvious abnormality, atraumatic Resp: Course breath sounds bilaterally. Few wheezes. Few crackles at  the bases. No rhonchi. Cardio: regular rate and rhythm, S1, S2 normal, no murmur, click, rub or gallop GI: soft, non-tender; bowel sounds normal; no masses,  no organomegaly Extremities: extremities normal, atraumatic, no cyanosis or edema Neurologic: Awake and alert. Oriented 3. No focal neurological deficits.  Lab Results:  Basic Metabolic Panel:  Recent Labs Lab 09/23/14 0455 09/24/14 1635 09/25/14 0436  NA 136 138 139  K 3.5 3.0* 3.7  CL 101 103 107  CO2  --  27 25  GLUCOSE 89 109* 122*  BUN 15 15 10   CREATININE 0.90 0.84 0.60  CALCIUM  --  8.1* 8.1*  MG  --   --  2.3   CBC:  Recent Labs Lab 09/23/14 0455 09/23/14 0458 09/24/14 1635 09/25/14 0436  WBC  --  5.9 9.7 7.7  NEUTROABS  --  3.3  --   --   HGB 13.9 13.1 13.4 12.9  HCT 41.0 39.4 41.0 38.9  MCV  --  98.0 99.5 98.5  PLT  --  476* 454* 447*   Cardiac Enzymes:  Recent Labs Lab 09/23/14 0458 09/25/14 0140 09/25/14 0740  TROPONINI <0.03 <0.03 <0.03    Recent Results (from the past 240 hour(s))  MRSA PCR Screening     Status: None   Collection Time: 09/25/14  2:32 AM  Result Value Ref Range Status   MRSA by PCR NEGATIVE NEGATIVE Final    Comment:        The GeneXpert MRSA Assay (FDA approved for NASAL specimens only), is one component of a comprehensive MRSA colonization surveillance program. It is not intended to diagnose MRSA infection nor to guide or monitor treatment for MRSA infections.       Studies/Results: Dg Chest 2 View (if Patient Has Fever And/or Copd)  09/24/2014   CLINICAL DATA:  Acute onset of shortness of breath for 3 days. Diarrhea. Initial encounter.  EXAM: CHEST  2 VIEW  COMPARISON:  Chest radiograph performed 03/26/2014, and CTA of the chest performed 09/23/2014  FINDINGS: The lungs are hypoexpanded. Mild vascular crowding and vascular congestion are seen. Retrocardiac opacity may reflect atelectasis or possibly mild pneumonia. No pleural effusion or pneumothorax is  identified.  The heart is normal in size; the mediastinal contour is within normal limits. A left-sided chest port is noted ending about the mid SVC. No acute osseous abnormalities are seen. Clips are noted within the right upper quadrant, reflecting prior cholecystectomy.  IMPRESSION: Lungs hypoexpanded. Retrocardiac opacity may reflect atelectasis or possibly mild pneumonia.   Electronically Signed   By: Garald Balding M.D.   On: 09/24/2014 17:25    Medications:  Scheduled: . antiseptic oral rinse  7 mL Mouth Rinse BID  . azithromycin  250 mg Oral Daily  . enoxaparin (LOVENOX) injection  40 mg Subcutaneous QHS  . gabapentin  900 mg Oral TID  . [START ON 09/26/2014] Influenza vac split quadrivalent PF  0.5 mL Intramuscular Tomorrow-1000  . levofloxacin (LEVAQUIN) IV  750 mg Intravenous Daily  . potassium chloride  40 mEq Oral Once  . predniSONE  60 mg Oral BID WC  . sodium chloride  3 mL Intravenous Q12H   Continuous: . sodium chloride 75 mL/hr at 09/25/14 1145  . sodium chloride 0.9 % 1,000 mL with potassium chloride 20 mEq infusion 75 mL/hr at 09/25/14 0154   PIR:[id number] **OR** acetaminophen, albuterol, HYDROcodone-homatropine, morphine injection, ondansetron **OR** ondansetron (ZOFRAN) IV, oxyCODONE  Assessment/Plan:  Active Problems:   Peripheral neuropathy   Fever   Diarrhea   Hematochezia   Atypical chest pain   Acute bronchitis    Community-acquired pneumonia with superimposed Acute bronchitis Levaquin will be initiated. Azithromycin was started due to concern for pertussis. Nebulizer treatments as needed. Influenza PCR and respiratory viral  panel are pending. Antitussive agents to continue. Strep antigen is negative. Add steroids.  Diarrhea/hematochezia Continue to monitor. C. difficile is pending. Hemoglobin remained stable. Lactic acid is normal. Continue IV fluids.  Atypical chest pain 09/23/14 CTA chest shows negative for pulmonary embolus. Troponins are  negative.. Chest pain, likely due to her respiratory symptoms. Morphine as needed.  Hypokalemia Repleted. Magnesium is normal.   Breast Cancer Previous history of right breast cancer. Now apparently she has a mass in the left breast. She is managed at [facility]. Not on chemotherapy quite yet.    DVT Prophylaxis: Lovenox    Code Status: Full code  Family Communication: Discussed with the patient  Disposition Plan: Not ready for discharge   LOS: 0 days   [facility] Pager [phone] 09/25/2014, 10:32 AM  If 7PM-7AM, please contact night-coverage at [email or web address], password [facility]

## 2014-09-26 DIAGNOSIS — J189 Pneumonia, unspecified organism: Principal | ICD-10-CM

## 2014-09-26 LAB — CBC
HCT: 37.4 % (ref 36.0–46.0)
Hemoglobin: 12.3 g/dL (ref 12.0–15.0)
MCH: 32.5 pg (ref 26.0–34.0)
MCHC: 32.9 g/dL (ref 30.0–36.0)
MCV: 98.7 fL (ref 78.0–100.0)
Platelets: 434 10*3/uL — ABNORMAL HIGH (ref 150–400)
RBC: 3.79 MIL/uL — ABNORMAL LOW (ref 3.87–5.11)
RDW: 14.1 % (ref 11.5–15.5)
WBC: 9 10*3/uL (ref 4.0–10.5)

## 2014-09-26 LAB — BASIC METABOLIC PANEL
Anion gap: 8 (ref 5–15)
BUN: 14 mg/dL (ref 6–23)
CHLORIDE: 108 mmol/L (ref 96–112)
CO2: 24 mmol/L (ref 19–32)
CREATININE: 0.7 mg/dL (ref 0.50–1.10)
Calcium: 8.3 mg/dL — ABNORMAL LOW (ref 8.4–10.5)
GFR calc non Af Amer: >90 mL/min (ref >90)
Glucose, Bld: 159 mg/dL — ABNORMAL HIGH (ref 70–99)
Potassium: 3.4 mmol/L — ABNORMAL LOW (ref 3.5–5.1)
SODIUM: 140 mmol/L (ref 135–145)

## 2014-09-26 LAB — LEGIONELLA ANTIGEN, URINE

## 2014-09-26 MED ORDER — BENZONATATE 100 MG PO CAPS
200.0000 mg | ORAL_CAPSULE | Freq: Three times a day (TID) | ORAL | Status: DC
  Administered 2014-09-26 – 2014-09-28 (×6): 200 mg via ORAL
  Filled 2014-09-26 (×6): qty 2

## 2014-09-26 MED ORDER — ALBUTEROL SULFATE (2.5 MG/3ML) 0.083% IN NEBU
2.5000 mg | INHALATION_SOLUTION | Freq: Four times a day (QID) | RESPIRATORY_TRACT | Status: DC
  Administered 2014-09-26 – 2014-09-28 (×7): 2.5 mg via RESPIRATORY_TRACT
  Filled 2014-09-26 (×8): qty 3

## 2014-09-26 MED ORDER — ALBUTEROL SULFATE (2.5 MG/3ML) 0.083% IN NEBU
INHALATION_SOLUTION | RESPIRATORY_TRACT | Status: AC
  Administered 2014-09-26: 2.5 mg
  Filled 2014-09-26: qty 3

## 2014-09-26 MED ORDER — POTASSIUM CHLORIDE CRYS ER 20 MEQ PO TBCR: 40.0000 meq | EXTENDED_RELEASE_TABLET | Freq: Once | ORAL | Status: DC

## 2014-09-26 MED ORDER — HYDROCOD POLST-CHLORPHEN POLST 10-8 MG/5ML PO LQCR
5.0000 mL | Freq: Two times a day (BID) | ORAL | Status: DC
Start: 2014-09-26 — End: 2014-09-28
  Administered 2014-09-26 – 2014-09-28 (×5): 5 mL via ORAL
  Filled 2014-09-26 (×5): qty 5

## 2014-09-26 NOTE — Progress Notes (Signed)
[facility] PROGRESS NOTE  Holly Parrish ATF:[id number] DOB: [birth year] DOA: 09/24/2014  PCP: Odette Fraction, MD  Brief HPI: 44 year old female with the history of breast cancer, migraine headaches, presented with cough and shortness of breath. She was found to have a lung infiltrate on her chest x-ray. She was admitted for further management of her pneumonia.  Past medical history:  Past Medical History  Diagnosis Date  . Lupus   . Joint pain   . Hearing loss   . Bilateral swelling of feet   . Asthma   . Neuromuscular disorder   . Migraines   . Headache(784.0)   . Arthritis   . Allergy   . Peripheral neuropathy     bilateral feet  . Hx of radiation therapy 01/25/13- 03/18/13    right chest wall/regional lymph nodes 5040 cGy 28 sessions, right mastectomy scar boost 1000 cGy 5 sessions  . Breast cancer 40  . Cancer 37    Cervical, treated with cryoablation  . Breast cancer     Consultants: None  Procedures: None  Antibiotics: Levaquin 2/29 Azithromycin 2/28  Subjective: Patient continues to have severe coughing spells. Some clear expectoration occasionally. Mostly dry. Shortness of breath mainly with exertion. Chest discomfort due to coughing spells. Denies any nausea, vomiting or diarrhea.   Objective: Vital Signs  Filed Vitals:   09/25/14 0457 09/25/14 1500 09/25/14 2124 09/26/14 0432  BP: 111/67 105/75 130/112 116/66  Pulse: 67 98 103 71  Temp: 98.3 F (36.8 C) 98.3 F (36.8 C) 98.5 F (36.9 C) 98.3 F (36.8 C)  TempSrc: Oral Oral Oral Oral  Resp: 18 20 22 20   Height:      Weight:      SpO2: 95% 95% 98% 93%    Intake/Output Summary (Last 24 hours) at 09/26/14 2706 Last data filed at 09/26/14 0600  Gross per 24 hour  Intake 2208.75 ml  Output   1125 ml  Net 1083.75 ml   Filed Weights   09/25/14 0112  Weight: 92.035 kg (202 lb 14.4 oz)    General appearance: alert, cooperative, appears stated age and no distress Resp: Course  breath sounds bilaterally. Few wheezes. Few crackles at the bases. No rhonchi. Not much change since yesterday Cardio: regular rate and rhythm, S1, S2 normal, no murmur, click, rub or gallop GI: soft, non-tender; bowel sounds normal; no masses,  no organomegaly Extremities: extremities normal, atraumatic, no cyanosis or edema Neurologic: Awake and alert. Oriented 3. No focal neurological deficits.  Lab Results:  Basic Metabolic Panel:  Recent Labs Lab 09/23/14 0455 09/24/14 1635 09/25/14 0436 09/26/14 0550  NA 136 138 139 140  K 3.5 3.0* 3.7 3.4*  CL 101 103 107 108  CO2  --  27 25 24   GLUCOSE 89 109* 122* 159*  BUN 15 15 10 14   CREATININE 0.90 0.84 0.60 0.70  CALCIUM  --  8.1* 8.1* 8.3*  MG  --   --  2.3  --    CBC:  Recent Labs Lab 09/23/14 0455 09/23/14 0458 09/24/14 1635 09/25/14 0436 09/26/14 0550  WBC  --  5.9 9.7 7.7 9.0  NEUTROABS  --  3.3  --   --   --   HGB 13.9 13.1 13.4 12.9 12.3  HCT 41.0 39.4 41.0 38.9 37.4  MCV  --  98.0 99.5 98.5 98.7  PLT  --  476* 454* 447* 434*   Cardiac Enzymes:  Recent Labs Lab 09/23/14 0458 09/25/14 0140 09/25/14 0740 09/25/14  67  TROPONINI <0.03 <0.03 <0.03 <0.03    Recent Results (from the past 240 hour(s))  Culture, blood (routine x 2)     Status: None (Preliminary result)   Collection Time: 09/25/14 12:46 AM  Result Value Ref Range Status   Specimen Description BLOOD L HAND  Final   Special Requests BOTTLES DRAWN AEROBIC AND ANAEROBIC 5CC  Final   Culture   Final           BLOOD CULTURE RECEIVED NO GROWTH TO DATE CULTURE WILL BE HELD FOR 5 DAYS BEFORE ISSUING A FINAL NEGATIVE REPORT Performed at [facility]    Report Status PENDING  Incomplete  Culture, blood (routine x 2)     Status: None (Preliminary result)   Collection Time: 09/25/14 12:47 AM  Result Value Ref Range Status   Specimen Description BLOOD LEFT ANTECUBITAL  Final   Special Requests BOTTLES DRAWN AEROBIC AND ANAEROBIC 5CC  Final     Culture   Final           BLOOD CULTURE RECEIVED NO GROWTH TO DATE CULTURE WILL BE HELD FOR 5 DAYS BEFORE ISSUING A FINAL NEGATIVE REPORT Performed at [facility]    Report Status PENDING  Incomplete  MRSA PCR Screening     Status: None   Collection Time: 09/25/14  2:32 AM  Result Value Ref Range Status   MRSA by PCR NEGATIVE NEGATIVE Final    Comment:        The GeneXpert MRSA Assay (FDA approved for NASAL specimens only), is one component of a comprehensive MRSA colonization surveillance program. It is not intended to diagnose MRSA infection nor to guide or monitor treatment for MRSA infections.       Studies/Results: Dg Chest 2 View (if Patient Has Fever And/or Copd)  09/24/2014   CLINICAL DATA:  Acute onset of shortness of breath for 3 days. Diarrhea. Initial encounter.  EXAM: CHEST  2 VIEW  COMPARISON:  Chest radiograph performed 03/26/2014, and CTA of the chest performed 09/23/2014  FINDINGS: The lungs are hypoexpanded. Mild vascular crowding and vascular congestion are seen. Retrocardiac opacity may reflect atelectasis or possibly mild pneumonia. No pleural effusion or pneumothorax is identified.  The heart is normal in size; the mediastinal contour is within normal limits. A left-sided chest port is noted ending about the mid SVC. No acute osseous abnormalities are seen. Clips are noted within the right upper quadrant, reflecting prior cholecystectomy.  IMPRESSION: Lungs hypoexpanded. Retrocardiac opacity may reflect atelectasis or possibly mild pneumonia.   Electronically Signed   By: Garald Balding M.D.   On: 09/24/2014 17:25    Medications:  Scheduled: . albuterol  2.5 mg Nebulization Q6H  . antiseptic oral rinse  7 mL Mouth Rinse BID  . azithromycin  250 mg Oral Daily  . benzonatate  200 mg Oral TID  . chlorpheniramine-HYDROcodone  5 mL Oral Q12H  . enoxaparin (LOVENOX) injection  40 mg Subcutaneous QHS  . gabapentin  900 mg Oral TID  . guaiFENesin  600 mg  Oral BID  . Influenza vac split quadrivalent PF  0.5 mL Intramuscular Tomorrow-1000  . levofloxacin (LEVAQUIN) IV  750 mg Intravenous Daily  . potassium chloride  40 mEq Oral Once  . potassium chloride  40 mEq Oral Once  . predniSONE  60 mg Oral BID WC  . sodium chloride  10-40 mL Intracatheter Q12H  . sodium chloride  3 mL Intravenous Q12H   Continuous: . sodium chloride 75 mL/hr  at 09/26/14 0007   RAX:[id number] **OR** acetaminophen, albuterol, morphine injection, ondansetron **OR** ondansetron (ZOFRAN) IV, oxyCODONE, sodium chloride  Assessment/Plan:  Principal Problem:   CAP (community acquired pneumonia) Active Problems:   Peripheral neuropathy   Fever   Diarrhea   Hematochezia   Atypical chest pain   Acute bronchitis    Community-acquired pneumonia with superimposed Acute bronchitis Continue antibiotics. Levaquin. Azithromycin was started due to concern for pertussis. Continue scheduled Nebulizer treatments. Influenza PCR and respiratory viral  panel are pending. Pertussis PCR is pending. Antitussive agents to continue. Strep antigen is negative. Continue steroids. HIV was nonreactive  Diarrhea/hematochezia Appears to have resolved. Stool studies are pending. C. difficile is pending. Hemoglobin remained stable. Lactic acid is normal. Continue IV fluids.  Atypical chest pain 09/23/14 CTA chest shows negative for pulmonary embolus. Troponins are negative.. Chest pain, likely due to her respiratory symptoms. Morphine as needed. Some improvement today.  Hypokalemia Will be Repleted. Magnesium is normal.   History of Breast Cancer Previous history of right breast cancer. Now apparently she has a mass in the left breast. She is managed at [facility]. Not on chemotherapy quite yet. Has follow-up in 2 weeks.  DVT Prophylaxis: Lovenox    Code Status: Full code  Family Communication: Discussed with the patient  Disposition Plan: Not ready for discharge. Mobilize as  tolerated.   LOS: 1 day   [facility] Pager [phone] 09/26/2014, 8:32 AM  If 7PM-7AM, please contact night-coverage at [email or web address], password [facility]

## 2014-09-27 LAB — BASIC METABOLIC PANEL
Anion gap: 6 (ref 5–15)
BUN: 10 mg/dL (ref 6–23)
CALCIUM: 8.5 mg/dL (ref 8.4–10.5)
CO2: 27 mmol/L (ref 19–32)
Chloride: 108 mmol/L (ref 96–112)
Creatinine, Ser: 0.59 mg/dL (ref 0.50–1.10)
GFR calc Af Amer: >90 mL/min (ref >90)
Glucose, Bld: 106 mg/dL — ABNORMAL HIGH (ref 70–99)
POTASSIUM: 3.4 mmol/L — AB (ref 3.5–5.1)
SODIUM: 141 mmol/L (ref 135–145)

## 2014-09-27 LAB — RESPIRATORY VIRUS PANEL
Adenovirus: NEGATIVE
Influenza A: NEGATIVE
Influenza B: NEGATIVE
METAPNEUMOVIRUS: POSITIVE — AB
PARAINFLUENZA 2 A: NEGATIVE
Parainfluenza 1: NEGATIVE
Parainfluenza 3: NEGATIVE
RESPIRATORY SYNCYTIAL VIRUS B: NEGATIVE
RHINOVIRUS: NEGATIVE
Respiratory Syncytial Virus A: NEGATIVE

## 2014-09-27 LAB — OCCULT BLOOD X 1 CARD TO LAB, STOOL: FECAL OCCULT BLD: NEGATIVE

## 2014-09-27 LAB — CLOSTRIDIUM DIFFICILE BY PCR: CDIFFPCR: NEGATIVE

## 2014-09-27 MED ORDER — MORPHINE SULFATE ER 15 MG PO TBCR
15.0000 mg | EXTENDED_RELEASE_TABLET | Freq: Two times a day (BID) | ORAL | Status: DC
  Administered 2014-09-27 – 2014-09-28 (×2): 15 mg via ORAL
  Filled 2014-09-27 (×2): qty 1

## 2014-09-27 NOTE — Progress Notes (Signed)
PROGRESS NOTE  Holly Parrish VXY:[id number] DOB: [birth year] DOA: 09/24/2014 PCP: Odette Fraction, MD  Assessment/Plan: Community-acquired pneumonia with superimposed Acute bronchiolitis Continue antibiotics.  Azithromycin was started due to concern for pertussis. Continue scheduled Nebulizer treatments. Influenza PCR and respiratory viral panel are pending. Pertussis PCR is pending. Antitussive agents to continue. Strep antigen is negative.  HIV was nonreactive -Urine Legionella antigen negative -respiratory viral panel positive for metapneumovirus -d/c steroids as this may prolong viral shedding -d/c levoflox -continue azithromycin  Diarrhea/hematochezia Appears to have resolved. Stool studies are pending. C. difficile is neg. Hemoglobin remained stable. Lactic acid is normal.  -saline lock IVF  Atypical chest pain 09/23/14 CTA chest shows negative for pulmonary embolus. Troponins are negative.. Chest pain, likely due to her respiratory symptoms. Morphine as needed. Some improvement today. -start MS Contin 15mg  bid  Hypokalemia Will be Repleted. Magnesium is normal.   History of Breast Cancer Previous history of right breast cancer. Now apparently she has a mass in the left breast. She is managed at [facility]. Not on chemotherapy quite yet. Has follow-up in 2 weeks.  Family Communication:   Pt at beside Disposition Plan:   Home when medically stable       Procedures/Studies: Dg Neck Soft Tissue  09/23/2014   CLINICAL DATA:  Shortness of breath.  Cough.  EXAM: NECK SOFT TISSUES - 1+ VIEW  COMPARISON:  None.  FINDINGS: There is no evidence of retropharyngeal soft tissue swelling or epiglottic enlargement. The cervical airway is unremarkable and no radio-opaque foreign body identified.  IMPRESSION: No abnormality seen in the soft tissues of the neck.   Electronically Signed   By: Marijo Conception, M.D.   On: 09/23/2014 08:09   Dg Chest 2 View (if Patient Has Fever  And/or Copd)  09/24/2014   CLINICAL DATA:  Acute onset of shortness of breath for 3 days. Diarrhea. Initial encounter.  EXAM: CHEST  2 VIEW  COMPARISON:  Chest radiograph performed 03/26/2014, and CTA of the chest performed 09/23/2014  FINDINGS: The lungs are hypoexpanded. Mild vascular crowding and vascular congestion are seen. Retrocardiac opacity may reflect atelectasis or possibly mild pneumonia. No pleural effusion or pneumothorax is identified.  The heart is normal in size; the mediastinal contour is within normal limits. A left-sided chest port is noted ending about the mid SVC. No acute osseous abnormalities are seen. Clips are noted within the right upper quadrant, reflecting prior cholecystectomy.  IMPRESSION: Lungs hypoexpanded. Retrocardiac opacity may reflect atelectasis or possibly mild pneumonia.   Electronically Signed   By: Garald Balding M.D.   On: 09/24/2014 17:25   Ct Angio Chest Pe W/cm &/or Wo Cm  09/23/2014   CLINICAL DATA:  Acute onset of shortness of breath for 1 hour.  EXAM: CT ANGIOGRAPHY CHEST WITH CONTRAST  TECHNIQUE: Multidetector CT imaging of the chest was performed using the standard protocol during bolus administration of intravenous contrast. Multiplanar CT image reconstructions and MIPs were obtained to evaluate the vascular anatomy.  CONTRAST:  163mL OMNIPAQUE IOHEXOL 350 MG/ML SOLN  COMPARISON:  Radiograph 03/26/2014.  CT 10/25/2013  FINDINGS: There are no filling defects within the pulmonary arteries to suggest pulmonary embolus.  The thoracic aorta is normal in caliber, no evidence of dissection. The heart is at the upper limits of normal in size. There is no mediastinal or hilar adenopathy.  Patient is post right mastectomy and axillary lymph node dissection. There is no right axillary adenopathy.  No left axillary adenopathy.  Reticulation in the right upper and right middle lobes anteriorly consistent with postradiation change. The previous tiny less than 5 mm pulmonary  nodules in the periphery of the left lower lobe are not significantly changed, some are obscured by associated atelectasis. The tiny nodule in the periphery of the right lower lobe is unchanged. No new nodule. Mild hypoventilatory changes at the lung bases. No consolidation to suggest pneumonia.  No acute abnormality in the included upper abdomen. Postcholecystectomy change. 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  There are no lytic or blastic osseous lesions. Mild degenerative change in the thoracic spine. Left chest port in place, tip in the SVC.  Review of the MIP images confirms the above findings.  IMPRESSION: 1. No pulmonary embolus. 2. Mild hypoventilatory change at the lung bases. There is otherwise no acute intrathoracic abnormality. 3. Small tiny nodules in the periphery of the right and left lower lobes are unchanged.   Electronically Signed   By: Jeb Levering M.D.   On: 09/23/2014 06:00         Subjective: Overall, patient states that chest pain and coughing are somewhat better. Denies any hemoptysis. Denies any fevers, chills, nausea, vomiting, diarrhea, hemoptysis, hematochezia, melena. No diarrhea.  Objective: Filed Vitals:   09/27/14 0459 09/27/14 0806 09/27/14 1445 09/27/14 1553  BP: 101/55  106/55   Pulse: 50  54   Temp: 98 F (36.7 C)  98.4 F (36.9 C)   TempSrc: Oral  Oral   Resp: 20  20   Height:      Weight:      SpO2: 98% 96% 95% 95%    Intake/Output Summary (Last 24 hours) at 09/27/14 1845 Last data filed at 09/27/14 1100  Gross per 24 hour  Intake   1140 ml  Output   1100 ml  Net     40 ml   Weight change:  Exam:   General:  Pt is alert, follows commands appropriately, not in acute distress  HEENT: No icterus, No thrush,  [place]/AT  Cardiovascular: RRR, S1/S2, no rubs, no gallops  Respiratory: Scattered bilateral rales. No wheeze. Good air movement.  Abdomen: Soft/+BS, non tender, non distended, no  guarding  Extremities: trace LE edema, No lymphangitis, No petechiae, No rashes, no synovitis  Data Reviewed: Basic Metabolic Panel:  Recent Labs Lab 09/23/14 0455 09/24/14 1635 09/25/14 0436 09/26/14 0550 09/27/14 0433  NA 136 138 139 140 141  K 3.5 3.0* 3.7 3.4* 3.4*  CL 101 103 107 108 108  CO2  --  27 25 24 27   GLUCOSE 89 109* 122* 159* 106*  BUN 15 15 10 14 10   CREATININE 0.90 0.84 0.60 0.70 0.59  CALCIUM  --  8.1* 8.1* 8.3* 8.5  MG  --   --  2.3  --   --    Liver Function Tests: No results for input(s): AST, ALT, ALKPHOS, BILITOT, PROT, ALBUMIN in the last 168 hours. No results for input(s): LIPASE, AMYLASE in the last 168 hours. No results for input(s): AMMONIA in the last 168 hours. CBC:  Recent Labs Lab 09/23/14 0455 09/23/14 0458 09/24/14 1635 09/25/14 0436 09/26/14 0550  WBC  --  5.9 9.7 7.7 9.0  NEUTROABS  --  3.3  --   --   --   HGB 13.9 13.1 13.4 12.9 12.3  HCT 41.0 39.4 41.0 38.9 37.4  MCV  --  98.0 99.5 98.5 98.7  PLT  --  476* 454* 447* 434*   Cardiac Enzymes:  Recent Labs Lab 09/23/14 0458 09/25/14 0140 09/25/14 0740 09/25/14 1625  TROPONINI <0.03 <0.03 <0.03 <0.03   BNP: Invalid input(s): POCBNP CBG: No results for input(s): GLUCAP in the last 168 hours.  Recent Results (from the past 240 hour(s))  Culture, blood (routine x 2)     Status: None (Preliminary result)   Collection Time: 09/25/14 12:46 AM  Result Value Ref Range Status   Specimen Description BLOOD L HAND  Final   Special Requests BOTTLES DRAWN AEROBIC AND ANAEROBIC 5CC  Final   Culture   Final           BLOOD CULTURE RECEIVED NO GROWTH TO DATE CULTURE WILL BE HELD FOR 5 DAYS BEFORE ISSUING A FINAL NEGATIVE REPORT Performed at [facility]    Report Status PENDING  Incomplete  Culture, blood (routine x 2)     Status: None (Preliminary result)   Collection Time: 09/25/14 12:47 AM  Result Value Ref Range Status   Specimen Description BLOOD LEFT ANTECUBITAL   Final   Special Requests BOTTLES DRAWN AEROBIC AND ANAEROBIC 5CC  Final   Culture   Final           BLOOD CULTURE RECEIVED NO GROWTH TO DATE CULTURE WILL BE HELD FOR 5 DAYS BEFORE ISSUING A FINAL NEGATIVE REPORT Performed at [facility]    Report Status PENDING  Incomplete  Respiratory virus panel (routine influenza)     Status: Abnormal   Collection Time: 09/25/14  2:31 AM  Result Value Ref Range Status   Respiratory Syncytial Virus A Negative Negative Final   Respiratory Syncytial Virus B Negative Negative Final   Influenza A Negative Negative Final   Influenza B Negative Negative Final   Parainfluenza 1 Negative Negative Final   Parainfluenza 2 Negative Negative Final   Parainfluenza 3 Negative Negative Final   Metapneumovirus Positive (A) Negative Final   Rhinovirus Negative Negative Final   Adenovirus Negative Negative Final    Comment: (NOTE) Performed At: [facility] [place], [place] [id number] Lindon Romp MD YW:[id number]   MRSA PCR Screening     Status: None   Collection Time: 09/25/14  2:32 AM  Result Value Ref Range Status   MRSA by PCR NEGATIVE NEGATIVE Final    Comment:        The GeneXpert MRSA Assay (FDA approved for NASAL specimens only), is one component of a comprehensive MRSA colonization surveillance program. It is not intended to diagnose MRSA infection nor to guide or monitor treatment for MRSA infections.   Clostridium Difficile by PCR     Status: None   Collection Time: 09/27/14  3:15 PM  Result Value Ref Range Status   C difficile by pcr NEGATIVE NEGATIVE Final     Scheduled Meds: . albuterol  2.5 mg Nebulization Q6H  . antiseptic oral rinse  7 mL Mouth Rinse BID  . azithromycin  250 mg Oral Daily  . benzonatate  200 mg Oral TID  . chlorpheniramine-HYDROcodone  5 mL Oral Q12H  . enoxaparin (LOVENOX) injection  40 mg Subcutaneous QHS  . gabapentin  900 mg Oral TID  . guaiFENesin  600 mg Oral BID  .  Influenza vac split quadrivalent PF  0.5 mL Intramuscular Tomorrow-1000  . morphine  15 mg Oral Q12H  . potassium chloride  40 mEq Oral Once  . potassium chloride  40 mEq Oral Once  . sodium chloride  10-40  mL Intracatheter Q12H  . sodium chloride  3 mL Intravenous Q12H   Continuous Infusions: . sodium chloride 75 mL/hr at 09/27/14 0432     Meryl Hubers, DO  [facility] Pager [phone]  If 7PM-7AM, please contact night-coverage [email or web address] Password [email or web address] 09/27/2014, 6:45 PM   LOS: 2 days

## 2014-09-28 DIAGNOSIS — J208 Acute bronchitis due to other specified organisms: Secondary | ICD-10-CM

## 2014-09-28 LAB — BASIC METABOLIC PANEL
ANION GAP: 7 (ref 5–15)
BUN: 12 mg/dL (ref 6–23)
CHLORIDE: 107 mmol/L (ref 96–112)
CO2: 27 mmol/L (ref 19–32)
Calcium: 8.2 mg/dL — ABNORMAL LOW (ref 8.4–10.5)
Creatinine, Ser: 0.66 mg/dL (ref 0.50–1.10)
GFR calc Af Amer: >90 mL/min (ref >90)
GFR calc non Af Amer: >90 mL/min (ref >90)
Glucose, Bld: 102 mg/dL — ABNORMAL HIGH (ref 70–99)
Potassium: 3.3 mmol/L — ABNORMAL LOW (ref 3.5–5.1)
Sodium: 141 mmol/L (ref 135–145)

## 2014-09-28 LAB — CBC
HCT: 38.5 % (ref 36.0–46.0)
HEMOGLOBIN: 12.6 g/dL (ref 12.0–15.0)
MCH: 32.2 pg (ref 26.0–34.0)
MCHC: 32.7 g/dL (ref 30.0–36.0)
MCV: 98.5 fL (ref 78.0–100.0)
Platelets: 417 10*3/uL — ABNORMAL HIGH (ref 150–400)
RBC: 3.91 MIL/uL (ref 3.87–5.11)
RDW: 14.1 % (ref 11.5–15.5)
WBC: 11.1 10*3/uL — ABNORMAL HIGH (ref 4.0–10.5)

## 2014-09-28 MED ORDER — MORPHINE SULFATE ER 15 MG PO TBCR: 15.0000 mg | EXTENDED_RELEASE_TABLET | Freq: Two times a day (BID) | ORAL | Status: AC

## 2014-09-28 MED ORDER — OXYCODONE HCL 5 MG PO TABS: 5.0000 mg | ORAL_TABLET | ORAL | Status: DC | PRN

## 2014-09-28 MED ORDER — POTASSIUM CHLORIDE CRYS ER 20 MEQ PO TBCR
40.0000 meq | EXTENDED_RELEASE_TABLET | Freq: Once | ORAL | Status: DC
  Filled 2014-09-28: qty 2

## 2014-09-28 MED ORDER — HEPARIN SOD (PORK) LOCK FLUSH 100 UNIT/ML IV SOLN
500.0000 [IU] | INTRAVENOUS | Status: AC | PRN
  Administered 2014-09-28: 500 [IU]

## 2014-09-28 MED ORDER — ALBUTEROL SULFATE HFA 108 (90 BASE) MCG/ACT IN AERS: 2.0000 | INHALATION_SPRAY | Freq: Four times a day (QID) | RESPIRATORY_TRACT | Status: AC | PRN

## 2014-09-28 MED ORDER — AZITHROMYCIN 250 MG PO TABS: 250.0000 mg | ORAL_TABLET | Freq: Once | ORAL | Status: AC

## 2014-09-28 MED ORDER — MOMETASONE FUROATE 200 MCG/ACT IN AERO: 1.0000 | INHALATION_SPRAY | Freq: Two times a day (BID) | RESPIRATORY_TRACT | Status: AC

## 2014-09-28 MED ORDER — HYDROCOD POLST-CHLORPHEN POLST 10-8 MG/5ML PO LQCR: 5.0000 mL | Freq: Two times a day (BID) | ORAL | Status: AC

## 2014-09-28 MED ORDER — BENZONATATE 200 MG PO CAPS: 200.0000 mg | ORAL_CAPSULE | Freq: Three times a day (TID) | ORAL | Status: AC

## 2014-09-28 NOTE — Discharge Summary (Signed)
Physician Discharge Summary  Holly Parrish XNA:[id number] DOB: 10-16-1970 DOA: 09/24/2014  PCP: Odette Fraction, MD  Admit date: 09/24/2014 Discharge date: 09/28/2014  Recommendations for Outpatient Follow-up:  1. Pt will need to follow up with PCP in 2 weeks post discharge 2. Please obtain BMP in 1-2 weeks  Discharge Diagnoses:  Community-acquired pneumonia with superimposed Acute bronchiolitis Azithromycin was started due to concern for pertussis. Continue scheduled Nebulizer treatments. Influenza PCR and respiratory viral panel are pending. Pertussis PCR is pending. Antitussive agents to continue. Strep antigen is negative.  -HIV was nonreactive -Urine Legionella antigen negative -respiratory viral panel positive for metapneumovirus -d/c steroids as this may prolong viral shedding -d/c levoflox -continue azithromycin--one more dose on 09/29/14 which will complete 5 days of therapy  Diarrhea/hematochezia Appears to have resolved.  C. difficile is neg. Hemoglobin remained stable. Lactic acid is normal.  -saline lock IVF -stool pathogen panel is pending, but pt is no longer having any diarrhea -she is tolerating diet  Atypical chest pain 09/23/14 CTA chest shows negative for pulmonary embolus. Troponins are negative.. Chest pain, likely due to her respiratory symptoms. Morphine as needed. Some improvement today. -start MS Contin 15mg  bid, #15, no refills -oxyIR 5mg , #30, one po q 4 hrs prn breakthrough pain, no RF  Hypokalemia Will be Repleted. Magnesium is normal.   History of Breast Cancer Previous history of right breast cancer. Now apparently she has a mass in the left breast. She is managed at [facility]. Not on chemotherapy quite yet. Has follow-up in 2 weeks.  Discharge Condition: stable  Disposition: home     Follow-up Information    Follow up with [facility], Blue Ruggerio, MD.   Specialty:  Otolaryngology   Contact information:   [address] [place] [address] [phone]       Follow up with [facility].   Specialty:  Emergency Medicine   Why:  As needed, If symptoms worsen   Contact information:   [address] [id number] [place] [phone]      Diet:regular Wt Readings from Last 3 Encounters:  09/25/14 92.035 kg (202 lb 14.4 oz)  09/23/14 81.647 kg (180 lb)  12/23/13 96.253 kg (212 lb 3.2 oz)    History of present illness:  44 year old female with a history of breast cancer and cervical cancer, peripheral neuropathy, migraine headache presents with 3 day history of worsening coughing and shortness of breath. The patient has been having paroxysms of coughing to the point where she vomits and had a syncopal episode on the day of admission. She states that she has been having fevers up to 103.50F at home. In addition, the patient has had some intermittent episodes of hemoptysis with her vigorous coughing. She's been having chest discomfort as a result of coughing with associated shortness of breath. The patient does not smoke. She denies any recent travels or sick contacts. 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 She states that she feels mostly with domesticated animals but occasionally has some birds and Curator. In addition, the patient began having diarrhea the last 24 hours. She states that she has had over 10 bowel movements with some associated hematochezia. She denies any melena or abdominal pain. She denies any hematemesis. She denies eating any unusual or raw or undercooked foods. The patient this to the emergency department on 09/23/2014. The patient was sent home with clindamycin and prednisone. She began taking the prednisone which did not  help. She came back on 09/24/2014 with worsening cough.  The patient was started on azithromycin and levofloxacin. Respiratory viral panel was obtained. Once it returned showing  MetaPneumovirus, levofloxacin was discontinued. The patient will finish a five-day course of azithromycin for previous concerns of pertussis. Overall, the patient clinically improved although she continued to have coughing and bronchospasms. The patient's vital signs remained stable and she remained stable on room air. She will go home with a steroid inhaler for the next week as well as anti-tussive and opioid medications.     Discharge Exam: Filed Vitals:   09/28/14 0640  BP: 103/54  Pulse: 52  Temp: 97.6 F (36.4 C)  Resp: 18   Filed Vitals:   09/27/14 1553 09/27/14 2041 09/27/14 2311 09/28/14 0640  BP:   117/75 103/54  Pulse:   60 52  Temp:   98.6 F (37 C) 97.6 F (36.4 C)  TempSrc:   Oral Oral  Resp:   16 18  Height:      Weight:      SpO2: 95% 100% 94% 93%   General: A&O x 3, NAD, pleasant, cooperative Cardiovascular: RRR, no rub, no gallop, no S3 Respiratory: Scattered bilateral rales. No wheezing. Good air movement. Abdomen:soft, nontender, nondistended, positive bowel sounds Extremities: No edema, No lymphangitis, no petechiae  Discharge Instructions     Medication List    STOP taking these medications        clindamycin 150 MG capsule  Commonly known as:  CLEOCIN     oxyCODONE-acetaminophen 5-325 MG per tablet  Commonly known as:  PERCOCET/ROXICET     predniSONE 20 MG tablet  Commonly known as:  DELTASONE     predniSONE 5 MG tablet  Commonly known as:  DELTASONE     sulfamethoxazole-trimethoprim 800-160 MG per tablet  Commonly known as:  SEPTRA DS      TAKE these medications        albuterol 108 (90 BASE) MCG/ACT inhaler  Commonly known as:  PROVENTIL HFA;VENTOLIN HFA  Inhale 2 puffs into the lungs every 6 (six) hours as needed for wheezing or shortness of breath.     azithromycin 250 MG tablet  Commonly known as:  ZITHROMAX  Take 1 tablet (250 mg total) by mouth once.  Start taking on:  09/29/2014     benzonatate 200 MG capsule  Commonly  known as:  TESSALON  Take 1 capsule (200 mg total) by mouth 3 (three) times daily.     chlorpheniramine-HYDROcodone 10-8 MG/5ML Lqcr  Commonly known as:  TUSSIONEX  Take 5 mLs by mouth every 12 (twelve) hours.     gabapentin 300 MG capsule  Commonly known as:  NEURONTIN  Take 900 mg by mouth 3 (three) times daily.     Mometasone Furoate 200 MCG/ACT Aero  Commonly known as:  ASMANEX HFA  Inhale 1 puff into the lungs 2 (two) times daily. X 1 week     morphine 15 MG 12 hr tablet  Commonly known as:  MS CONTIN  Take 1 tablet (15 mg total) by mouth every 12 (twelve) hours.     ondansetron 4 MG tablet  Commonly known as:  ZOFRAN  Take 1 tablet (4 mg total) by mouth every 6 (six) hours.     oxyCODONE 5 MG immediate release tablet  Commonly known as:  Oxy IR/ROXICODONE  Take 1 tablet (5 mg total) by mouth every 4 (four) hours as needed for severe pain.     polyethylene glycol packet  Commonly known as:  MIRALAX / GLYCOLAX  Take 17 g by mouth daily as needed for mild constipation.         The results of significant diagnostics from this hospitalization (including imaging, microbiology, ancillary and laboratory) are listed below for reference.    Significant Diagnostic Studies: Dg Neck Soft Tissue  09/23/2014   CLINICAL DATA:  Shortness of breath.  Cough.  EXAM: NECK SOFT TISSUES - 1+ VIEW  COMPARISON:  None.  FINDINGS: There is no evidence of retropharyngeal soft tissue swelling or epiglottic enlargement. The cervical airway is unremarkable and no radio-opaque foreign body identified.  IMPRESSION: No abnormality seen in the soft tissues of the neck.   Electronically Signed   By: Marijo Conception, M.D.   On: 09/23/2014 08:09   Dg Chest 2 View (if Patient Has Fever And/or Copd)  09/24/2014   CLINICAL DATA:  Acute onset of shortness of breath for 3 days. Diarrhea. Initial encounter.  EXAM: CHEST  2 VIEW  COMPARISON:  Chest radiograph performed 03/26/2014, and CTA of the chest performed  09/23/2014  FINDINGS: The lungs are hypoexpanded. Mild vascular crowding and vascular congestion are seen. Retrocardiac opacity may reflect atelectasis or possibly mild pneumonia. No pleural effusion or pneumothorax is identified.  The heart is normal in size; the mediastinal contour is within normal limits. A left-sided chest port is noted ending about the mid SVC. No acute osseous abnormalities are seen. Clips are noted within the right upper quadrant, reflecting prior cholecystectomy.  IMPRESSION: Lungs hypoexpanded. Retrocardiac opacity may reflect atelectasis or possibly mild pneumonia.   Electronically Signed   By: Garald Balding M.D.   On: 09/24/2014 17:25   Ct Angio Chest Pe W/cm &/or Wo Cm  09/23/2014   CLINICAL DATA:  Acute onset of shortness of breath for 1 hour.  EXAM: CT ANGIOGRAPHY CHEST WITH CONTRAST  TECHNIQUE: Multidetector CT imaging of the chest was performed using the standard protocol during bolus administration of intravenous contrast. Multiplanar CT image reconstructions and MIPs were obtained to evaluate the vascular anatomy.  CONTRAST:  162mL OMNIPAQUE IOHEXOL 350 MG/ML SOLN  COMPARISON:  Radiograph 03/26/2014.  CT 10/25/2013  FINDINGS: There are no filling defects within the pulmonary arteries to suggest pulmonary embolus.  The thoracic aorta is normal in caliber, no evidence of dissection. The heart is at the upper limits of normal in size. There is no mediastinal or hilar adenopathy.  Patient is post right mastectomy and axillary lymph node dissection. There is no right axillary adenopathy. No left axillary adenopathy.  Reticulation in the right upper and right middle lobes anteriorly consistent with postradiation change. The previous tiny less than 5 mm pulmonary nodules in the periphery of the left lower lobe are not significantly changed, some are obscured by associated atelectasis. The tiny nodule in the periphery of the right lower lobe is unchanged. No new nodule. Mild  hypoventilatory changes at the lung bases. No consolidation to suggest pneumonia.  No acute abnormality in the included upper abdomen. Postcholecystectomy change. 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  There are no lytic or blastic osseous lesions. Mild degenerative change in the thoracic spine. Left chest port in place, tip in the SVC.  Review of the MIP images confirms the above findings.  IMPRESSION: 1. No pulmonary embolus. 2. Mild hypoventilatory change at the lung bases. There is otherwise no acute intrathoracic abnormality. 3. Small tiny nodules in the periphery of the right and left lower lobes are  unchanged.   Electronically Signed   By: Jeb Levering M.D.   On: 09/23/2014 06:00     Microbiology: Recent Results (from the past 240 hour(s))  Culture, blood (routine x 2)     Status: None (Preliminary result)   Collection Time: 09/25/14 12:46 AM  Result Value Ref Range Status   Specimen Description BLOOD L HAND  Final   Special Requests BOTTLES DRAWN AEROBIC AND ANAEROBIC 5CC  Final   Culture   Final           BLOOD CULTURE RECEIVED NO GROWTH TO DATE CULTURE WILL BE HELD FOR 5 DAYS BEFORE ISSUING A FINAL NEGATIVE REPORT Performed at [facility]    Report Status PENDING  Incomplete  Culture, blood (routine x 2)     Status: None (Preliminary result)   Collection Time: 09/25/14 12:47 AM  Result Value Ref Range Status   Specimen Description BLOOD LEFT ANTECUBITAL  Final   Special Requests BOTTLES DRAWN AEROBIC AND ANAEROBIC 5CC  Final   Culture   Final           BLOOD CULTURE RECEIVED NO GROWTH TO DATE CULTURE WILL BE HELD FOR 5 DAYS BEFORE ISSUING A FINAL NEGATIVE REPORT Performed at [facility]    Report Status PENDING  Incomplete  Respiratory virus panel (routine influenza)     Status: Abnormal   Collection Time: 09/25/14  2:31 AM  Result Value Ref Range Status   Respiratory Syncytial Virus A Negative Negative Final    Respiratory Syncytial Virus B Negative Negative Final   Influenza A Negative Negative Final   Influenza B Negative Negative Final   Parainfluenza 1 Negative Negative Final   Parainfluenza 2 Negative Negative Final   Parainfluenza 3 Negative Negative Final   Metapneumovirus Positive (A) Negative Final   Rhinovirus Negative Negative Final   Adenovirus Negative Negative Final    Comment: (NOTE) Performed At: [facility] [place], [place] [id number] Lindon Romp MD [id number]   MRSA PCR Screening     Status: None   Collection Time: 09/25/14  2:32 AM  Result Value Ref Range Status   MRSA by PCR NEGATIVE NEGATIVE Final    Comment:        The GeneXpert MRSA Assay (FDA approved for NASAL specimens only), is one component of a comprehensive MRSA colonization surveillance program. It is not intended to diagnose MRSA infection nor to guide or monitor treatment for MRSA infections.   Clostridium Difficile by PCR     Status: None   Collection Time: 09/27/14  3:15 PM  Result Value Ref Range Status   C difficile by pcr NEGATIVE NEGATIVE Final     Labs: Basic Metabolic Panel:  Recent Labs Lab 09/24/14 1635 09/25/14 0436 09/26/14 0550 09/27/14 0433 09/28/14 0449  NA 138 139 140 141 141  K 3.0* 3.7 3.4* 3.4* 3.3*  CL 103 107 108 108 107  CO2 27 25 24 27 27   GLUCOSE 109* 122* 159* 106* 102*  BUN 15 10 14 10 12   CREATININE 0.84 0.60 0.70 0.59 0.66  CALCIUM 8.1* 8.1* 8.3* 8.5 8.2*  MG  --  2.3  --   --   --    Liver Function Tests: No results for input(s): AST, ALT, ALKPHOS, BILITOT, PROT, ALBUMIN in the last 168 hours. No results for input(s): LIPASE, AMYLASE in the last 168 hours. No results for input(s): AMMONIA in the last 168 hours. CBC:  Recent Labs Lab 09/23/14 0458  09/24/14 1635 09/25/14 0436 09/26/14 0550 09/28/14 0449  WBC 5.9 9.7 7.7 9.0 11.1*  NEUTROABS 3.3  --   --   --   --   HGB 13.1 13.4 12.9 12.3 12.6  HCT 39.4 41.0 38.9  37.4 38.5  MCV 98.0 99.5 98.5 98.7 98.5  PLT 476* 454* 447* 434* 417*   Cardiac Enzymes:  Recent Labs Lab 09/23/14 0458 09/25/14 0140 09/25/14 0740 09/25/14 1625  TROPONINI <0.03 <0.03 <0.03 <0.03   BNP: Invalid input(s): POCBNP CBG: No results for input(s): GLUCAP in the last 168 hours.  Time coordinating discharge:  Greater than 30 minutes  Signed:  Momin Misko, DO [facility] Pager: [phone] 09/28/2014, 10:51 AM

## 2014-09-29 LAB — GI PATHOGEN PANEL BY PCR, STOOL
C difficile toxin A/B: NOT DETECTED
Campylobacter by PCR: NOT DETECTED
Cryptosporidium by PCR: NOT DETECTED
E coli (ETEC) LT/ST: NOT DETECTED
E coli (STEC): NOT DETECTED
E coli 0157 by PCR: NOT DETECTED
G lamblia by PCR: NOT DETECTED
NOROVIRUS G1/G2: NOT DETECTED
ROTAVIRUS A BY PCR: NOT DETECTED
Salmonella by PCR: NOT DETECTED
Shigella by PCR: NOT DETECTED

## 2014-10-01 LAB — CULTURE, BLOOD (ROUTINE X 2)
CULTURE: NO GROWTH
Culture: NO GROWTH

## 2014-10-17 LAB — BORDETELLA PERTUSSIS PCR

## 2015-02-25 ENCOUNTER — Encounter (HOSPITAL_COMMUNITY): Payer: Self-pay | Admitting: *Deleted

## 2015-02-25 ENCOUNTER — Emergency Department (HOSPITAL_COMMUNITY): Payer: Medicare Other

## 2015-02-25 ENCOUNTER — Emergency Department (HOSPITAL_COMMUNITY)
Admission: EM | Admit: 2015-02-25 | Discharge: 2015-02-25 | Disposition: A | Discharge status: Home / Self Care | Payer: Medicare Other | Attending: Emergency Medicine | Admitting: Emergency Medicine

## 2015-02-25 DIAGNOSIS — Z79899 Other long term (current) drug therapy: Secondary | ICD-10-CM | POA: Insufficient documentation

## 2015-02-25 DIAGNOSIS — H9190 Unspecified hearing loss, unspecified ear: Secondary | ICD-10-CM | POA: Insufficient documentation

## 2015-02-25 DIAGNOSIS — S299XXA Unspecified injury of thorax, initial encounter: Secondary | ICD-10-CM | POA: Diagnosis not present

## 2015-02-25 DIAGNOSIS — G43909 Migraine, unspecified, not intractable, without status migrainosus: Secondary | ICD-10-CM | POA: Diagnosis not present

## 2015-02-25 DIAGNOSIS — W5519XA Other contact with horse, initial encounter: Secondary | ICD-10-CM | POA: Diagnosis not present

## 2015-02-25 DIAGNOSIS — Z872 Personal history of diseases of the skin and subcutaneous tissue: Secondary | ICD-10-CM | POA: Insufficient documentation

## 2015-02-25 DIAGNOSIS — Y929 Unspecified place or not applicable: Secondary | ICD-10-CM | POA: Diagnosis not present

## 2015-02-25 DIAGNOSIS — Z880 Allergy status to penicillin: Secondary | ICD-10-CM | POA: Insufficient documentation

## 2015-02-25 DIAGNOSIS — Z9010 Acquired absence of unspecified breast and nipple: Secondary | ICD-10-CM | POA: Diagnosis not present

## 2015-02-25 DIAGNOSIS — R9389 Abnormal findings on diagnostic imaging of other specified body structures: Secondary | ICD-10-CM

## 2015-02-25 DIAGNOSIS — Z853 Personal history of malignant neoplasm of breast: Secondary | ICD-10-CM | POA: Diagnosis not present

## 2015-02-25 DIAGNOSIS — S6991XA Unspecified injury of right wrist, hand and finger(s), initial encounter: Secondary | ICD-10-CM | POA: Diagnosis not present

## 2015-02-25 DIAGNOSIS — J45909 Unspecified asthma, uncomplicated: Secondary | ICD-10-CM | POA: Insufficient documentation

## 2015-02-25 DIAGNOSIS — S4991XA Unspecified injury of right shoulder and upper arm, initial encounter: Secondary | ICD-10-CM | POA: Insufficient documentation

## 2015-02-25 DIAGNOSIS — R937 Abnormal findings on diagnostic imaging of other parts of musculoskeletal system: Secondary | ICD-10-CM | POA: Insufficient documentation

## 2015-02-25 DIAGNOSIS — M1990 Unspecified osteoarthritis, unspecified site: Secondary | ICD-10-CM | POA: Insufficient documentation

## 2015-02-25 DIAGNOSIS — Y999 Unspecified external cause status: Secondary | ICD-10-CM | POA: Diagnosis not present

## 2015-02-25 DIAGNOSIS — Y939 Activity, unspecified: Secondary | ICD-10-CM | POA: Diagnosis not present

## 2015-02-25 DIAGNOSIS — R0789 Other chest pain: Secondary | ICD-10-CM

## 2015-02-25 LAB — URINALYSIS, ROUTINE W REFLEX MICROSCOPIC
Bilirubin Urine: NEGATIVE
Glucose, UA: NEGATIVE mg/dL
Hgb urine dipstick: NEGATIVE
KETONES UR: NEGATIVE mg/dL
Leukocytes, UA: NEGATIVE
Nitrite: NEGATIVE
PROTEIN: NEGATIVE mg/dL
SPECIFIC GRAVITY, URINE: 1.025 (ref 1.005–1.030)
UROBILINOGEN UA: 0.2 mg/dL (ref 0.0–1.0)
pH: 6 (ref 5.0–8.0)

## 2015-02-25 LAB — BASIC METABOLIC PANEL
Anion gap: 10 (ref 5–15)
BUN: 14 mg/dL (ref 6–20)
CO2: 23 mmol/L (ref 22–32)
Calcium: 10.1 mg/dL (ref 8.9–10.3)
Chloride: 109 mmol/L (ref 101–111)
Creatinine, Ser: 0.86 mg/dL (ref 0.44–1.00)
GFR calc Af Amer: >60 mL/min (ref >60)
GLUCOSE: 109 mg/dL — AB (ref 65–99)
Potassium: 3.8 mmol/L (ref 3.5–5.1)
SODIUM: 142 mmol/L (ref 135–145)

## 2015-02-25 LAB — CBC
HEMATOCRIT: 46.8 % — AB (ref 36.0–46.0)
Hemoglobin: 15.7 g/dL — ABNORMAL HIGH (ref 12.0–15.0)
MCH: 33.3 pg (ref 26.0–34.0)
MCHC: 33.5 g/dL (ref 30.0–36.0)
MCV: 99.2 fL (ref 78.0–100.0)
PLATELETS: 567 10*3/uL — AB (ref 150–400)
RBC: 4.72 MIL/uL (ref 3.87–5.11)
RDW: 13.9 % (ref 11.5–15.5)
WBC: 9.6 10*3/uL (ref 4.0–10.5)

## 2015-02-25 MED ORDER — OXYCODONE HCL 5 MG PO TABS: 5.0000 mg | ORAL_TABLET | Freq: Four times a day (QID) | ORAL | Status: AC | PRN

## 2015-02-25 MED ORDER — IOHEXOL 300 MG/ML  SOLN
100.0000 mL | Freq: Once | INTRAMUSCULAR | Status: AC | PRN
  Administered 2015-02-25: 100 mL via INTRAVENOUS

## 2015-02-25 MED ORDER — ONDANSETRON HCL 4 MG PO TABS
4.0000 mg | ORAL_TABLET | Freq: Once | ORAL | Status: AC
  Administered 2015-02-25: 4 mg via ORAL
  Filled 2015-02-25: qty 1

## 2015-02-25 MED ORDER — ONDANSETRON HCL 4 MG PO TABS: 4.0000 mg | ORAL_TABLET | Freq: Three times a day (TID) | ORAL | Status: AC | PRN

## 2015-02-25 MED ORDER — OXYCODONE-ACETAMINOPHEN 5-325 MG PO TABS
1.0000 | ORAL_TABLET | Freq: Once | ORAL | Status: AC
  Administered 2015-02-25: 1 via ORAL
  Filled 2015-02-25: qty 1
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
## 2015-02-25 NOTE — ED Notes (Addendum)
Pt states she was knocked down by her horse today at ~230PM. The horse hit the pt's right shoulder and chest, knocking the pt against a wall and hitting her head. Pt now complains of pain in her head, right shoulder and right chest pain. Pt states she had a right sided mastectomy and is concerned about swelling in her left arm.

## 2015-02-25 NOTE — ED Notes (Signed)
Pt reported shoulder, head and chest pain s/p to being trample via horse.

## 2015-02-25 NOTE — ED Notes (Signed)
Gertie Fey, PA at bedside and given report to pt on test results. Verbalized understanding. Pt crying and upset stating that she is unable to move rt arm. Gertie Fey, [place] aware.

## 2015-02-26 ENCOUNTER — Other Ambulatory Visit: Payer: Self-pay | Admitting: Oncology

## 2015-02-26 ENCOUNTER — Telehealth: Payer: Self-pay | Admitting: Oncology

## 2015-02-26 ENCOUNTER — Encounter: Payer: Self-pay | Admitting: Oncology

## 2015-02-26 DIAGNOSIS — C50919 Malignant neoplasm of unspecified site of unspecified female breast: Secondary | ICD-10-CM

## 2015-02-26 NOTE — Progress Notes (Unsigned)
Holly Parrish had a horse accident which took her to the emergency room. CT of the chest showed pulmonary nodules in the right lung. These are very small. I am setting her up for a PET scan and follow-up with me on August 19.  She has two no show appointments. If she prefers to be followed in [place] we can easily arrange for that.
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
## 2015-03-05 ENCOUNTER — Other Ambulatory Visit: Payer: Self-pay | Admitting: Oncology

## 2015-03-05 ENCOUNTER — Ambulatory Visit (HOSPITAL_COMMUNITY)
Admission: RE | Admit: 2015-03-05 | Discharge: 2015-03-05 | Disposition: A | Discharge status: Home / Self Care | Payer: Medicare Other | Source: Ambulatory Visit | Attending: Oncology | Admitting: Oncology

## 2015-03-05 DIAGNOSIS — R918 Other nonspecific abnormal finding of lung field: Secondary | ICD-10-CM | POA: Diagnosis not present

## 2015-03-05 DIAGNOSIS — C50919 Malignant neoplasm of unspecified site of unspecified female breast: Secondary | ICD-10-CM

## 2015-03-05 DIAGNOSIS — Z9011 Acquired absence of right breast and nipple: Secondary | ICD-10-CM | POA: Diagnosis not present

## 2015-03-05 DIAGNOSIS — C50911 Malignant neoplasm of unspecified site of right female breast: Secondary | ICD-10-CM | POA: Insufficient documentation

## 2015-03-05 DIAGNOSIS — Z79899 Other long term (current) drug therapy: Secondary | ICD-10-CM | POA: Diagnosis not present

## 2015-03-05 DIAGNOSIS — Z8541 Personal history of malignant neoplasm of cervix uteri: Secondary | ICD-10-CM | POA: Diagnosis not present

## 2015-03-05 DIAGNOSIS — Z923 Personal history of irradiation: Secondary | ICD-10-CM | POA: Diagnosis not present

## 2015-03-05 DIAGNOSIS — Z9081 Acquired absence of spleen: Secondary | ICD-10-CM | POA: Diagnosis not present

## 2015-03-05 LAB — GLUCOSE, CAPILLARY: GLUCOSE-CAPILLARY: 104 mg/dL — AB (ref 65–99)

## 2015-03-05 MED ORDER — FLUDEOXYGLUCOSE F - 18 (FDG) INJECTION
9.9400 | Freq: Once | INTRAVENOUS | Status: AC | PRN
  Administered 2015-03-05: 9.94 via INTRAVENOUS

## 2015-03-09 ENCOUNTER — Other Ambulatory Visit: Payer: Medicare Other

## 2015-03-14 ENCOUNTER — Other Ambulatory Visit: Payer: Self-pay | Admitting: Oncology

## 2015-03-14 ENCOUNTER — Encounter: Payer: Self-pay | Admitting: Oncology

## 2015-03-16 ENCOUNTER — Ambulatory Visit: Payer: Medicare Other | Admitting: Oncology

## 2015-03-19 ENCOUNTER — Other Ambulatory Visit: Payer: Self-pay | Admitting: Oncology

## 2015-05-14 ENCOUNTER — Other Ambulatory Visit: Payer: Self-pay | Admitting: Oncology

## 2015-05-14 ENCOUNTER — Encounter: Payer: Self-pay | Admitting: Oncology
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
## 2015-05-15 ENCOUNTER — Telehealth: Payer: Self-pay

## 2015-05-15 ENCOUNTER — Other Ambulatory Visit: Payer: Self-pay

## 2015-05-15 NOTE — Telephone Encounter (Signed)
Patient called crying because no one has sent her requested fax and PET CT scan results to her.  Writer explained to her that there has been eight failed faxes for the number she provided.  After much talk she admits to the fax not working.  Writer made a copy and will send it to the address she provided in [place], [place].  If patient gets a new fax number or the original one is fixed Probation officer made a copy of the notes.

## 2015-08-27 ENCOUNTER — Telehealth: Payer: Self-pay | Admitting: *Deleted

## 2015-08-27 NOTE — Telephone Encounter (Signed)
This RN received call from pt stating she had sent a request for records to be sent to [facility] in [place] last week.  " you know I have metastatic breast cancer now ".  Per call this RN could not locate documentation of above interaction.  Informed pt records would be faxed per her request.  Pt did not know phone or fax number of above facility.  Holly Parrish states she has moved to [place] with phone number of [phone].  This RN obtained phone number for [facility] as [phone].  Called above and spoke with Holly Parrish who requested records to be faxed to [phone].  Records obtained and faxed to above.

## 2016-02-01 IMAGING — CR DG NECK SOFT TISSUE
2 series · 2 of 2 positions shown · non-contrast
Comparison: None.

CLINICAL DATA: Shortness of breath.  Cough.

EXAM:
NECK SOFT TISSUES - 1+ VIEW

[w soft tissue neck ap]
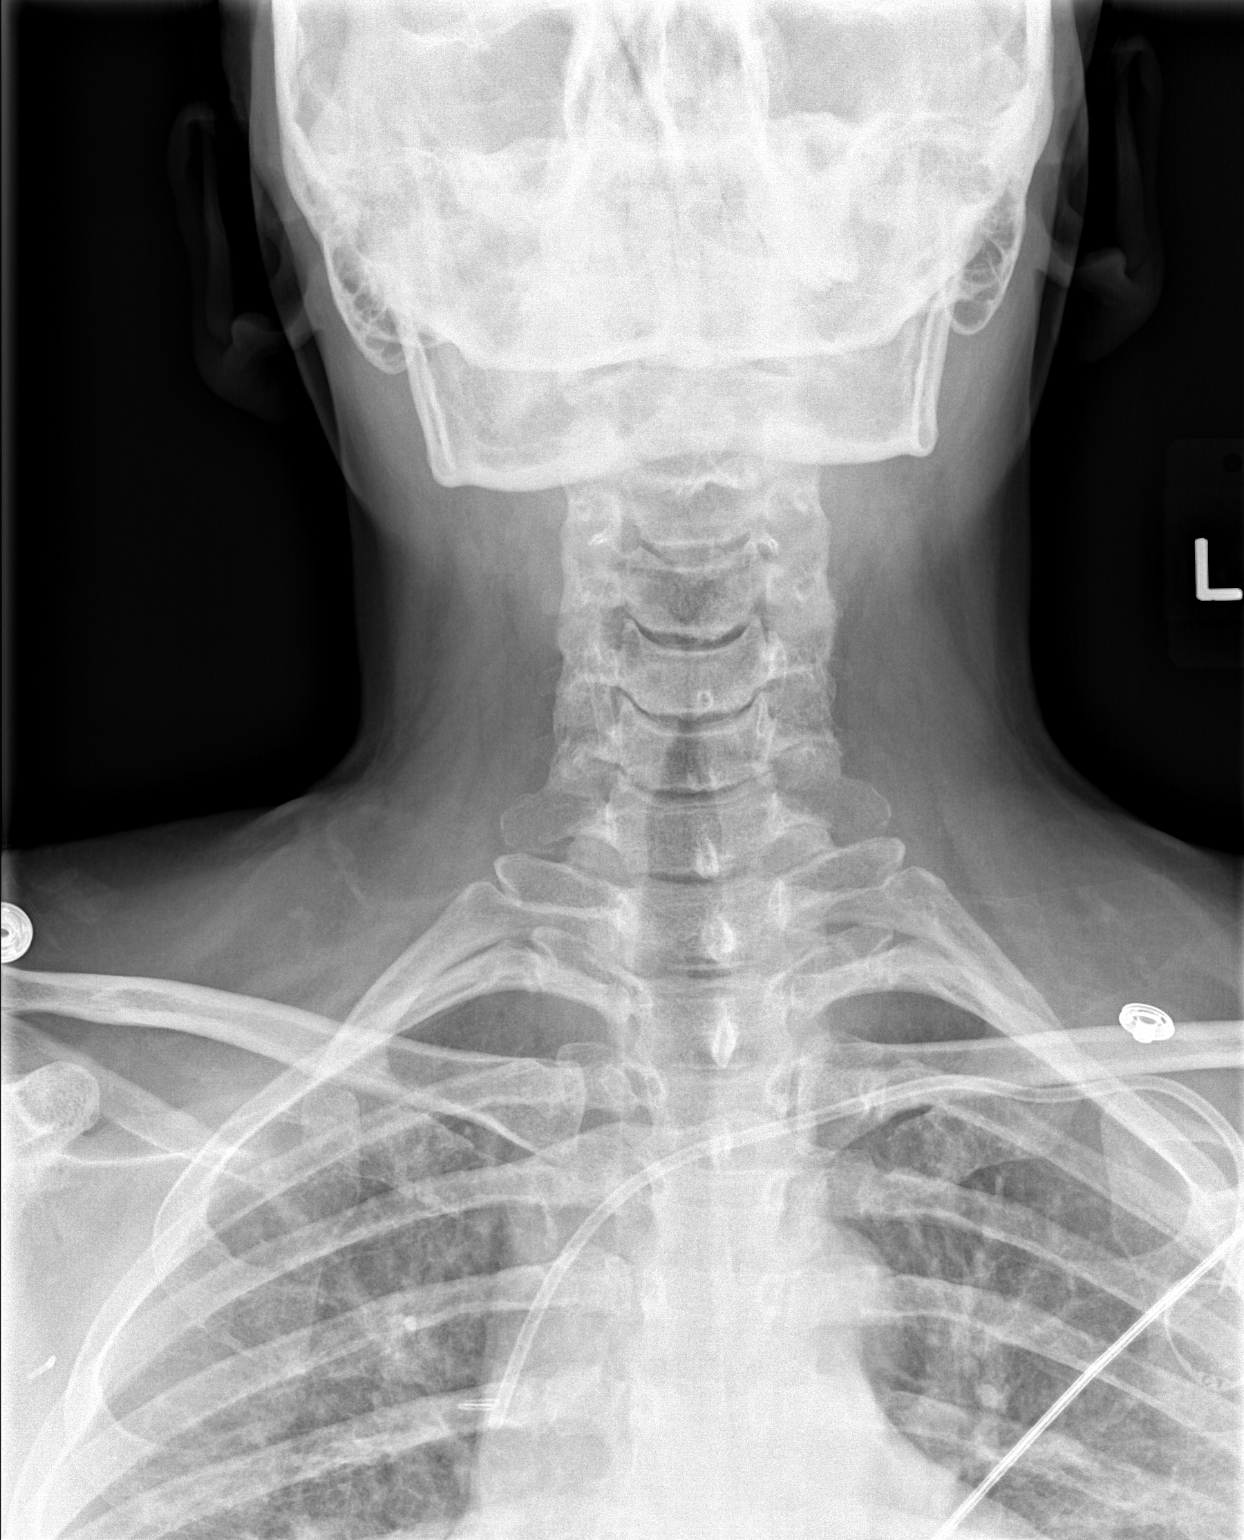

[w soft tissue neck lat]
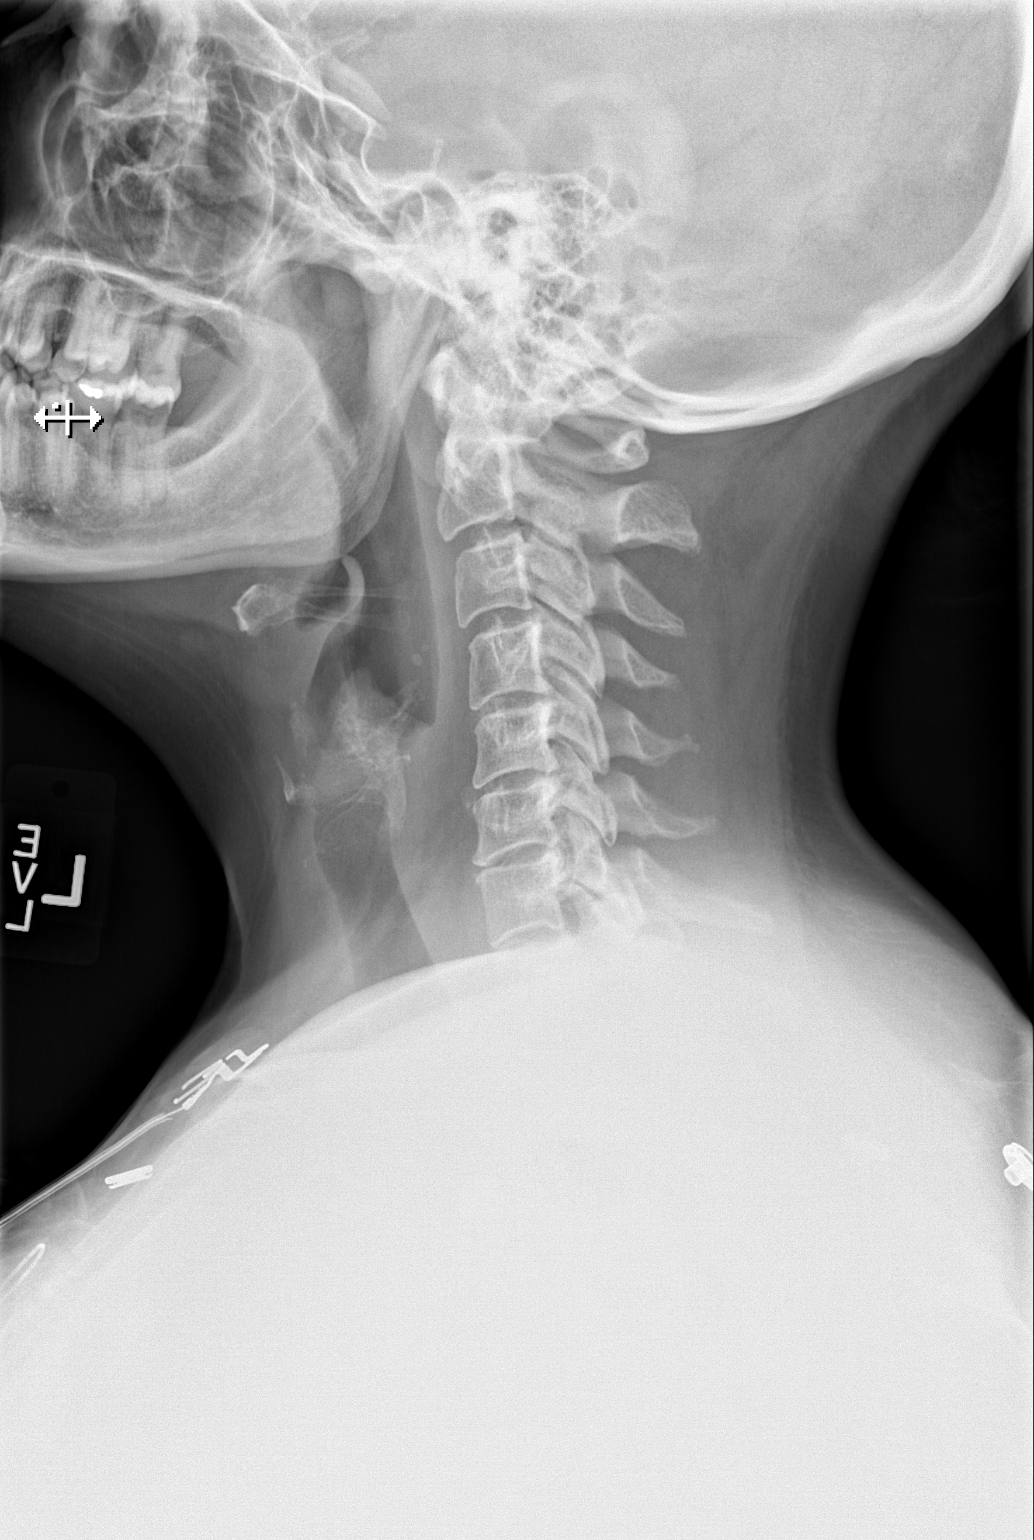

[2 of 2 positions shown; findings below may reference images not displayed]

FINDINGS: There is no evidence of retropharyngeal soft tissue swelling or
epiglottic enlargement. The cervical airway is unremarkable and no
radio-opaque foreign body identified.
IMPRESSION: No abnormality seen in the soft tissues of the neck.

## 2016-07-13 IMAGING — CT NM PET TUM IMG RESTAG (PS) SKULL BASE T - THIGH
8 series · 25 of 25 positions shown · non-contrast
Comparison: Chest CT 02/25/2015 and 09/23/2014.  PET-CT 03/12/2012.

CLINICAL DATA: Subsequent treatment strategy for right breast
cancer. Radiation therapy in 3883. History of cervical cancer. New
bilateral pulmonary nodules on chest CT performed for trauma.

EXAM:
NUCLEAR MEDICINE PET SKULL BASE TO THIGH
TECHNIQUE: 9.94 mCi F-18 FDG was injected intravenously. Full-ring PET imaging
was performed from the skull base to thigh after the radiotracer. CT
data was obtained and used for attenuation correction and anatomic
localization.
FASTING BLOOD GLUCOSE:  Value: 104 mg/dl

[Series 3: pet sk_thigh ac · axial · 5.0mm · 4.07mm/px · z∈[-946,-62]mm · 5 of 222 slices shown]
[im 1/222]
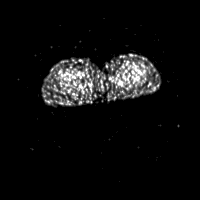
[im 56/222]
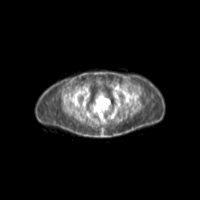
[im 111/222]
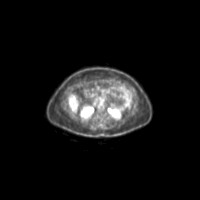
[im 166/222]
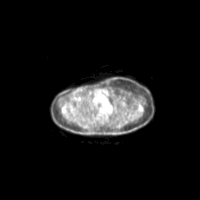
[im 222/222]
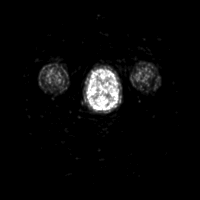

[Series 4: ct sk_thigh 5.0 b31f · axial · 5.0mm · 0.98mm/px · z∈[-946,-62]mm · 5 of 222 slices shown]
[im 1/222]
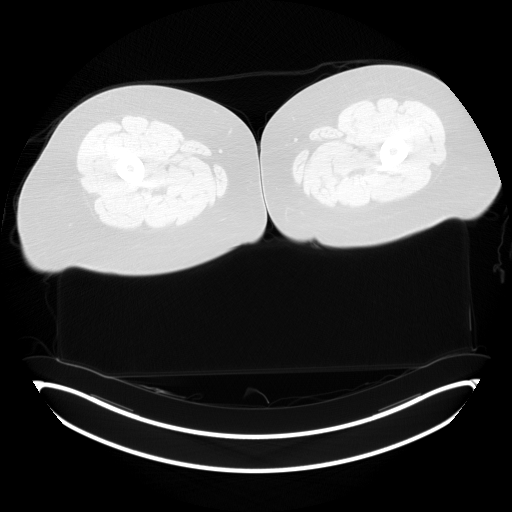
[im 56/222]
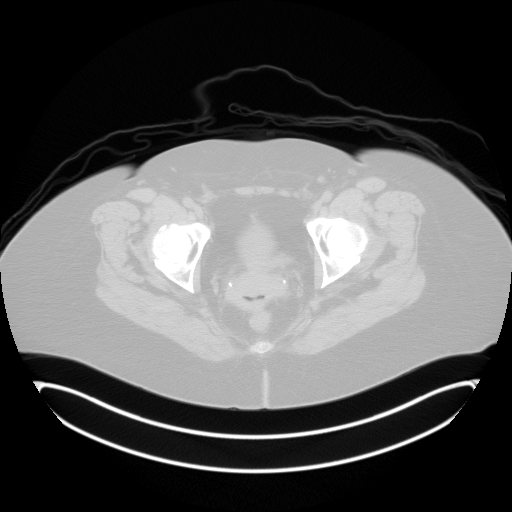
[im 111/222]
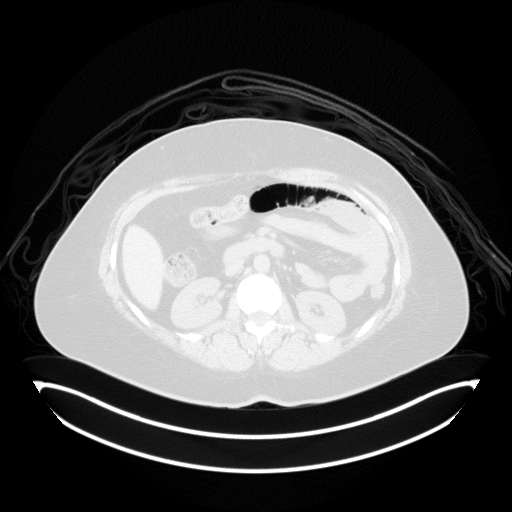
[im 166/222]
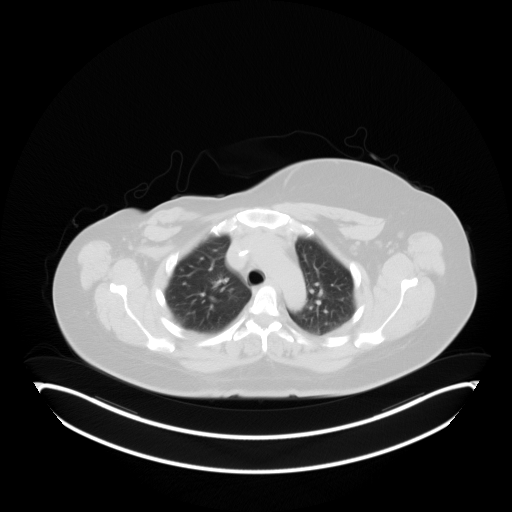
[im 222/222  brain]
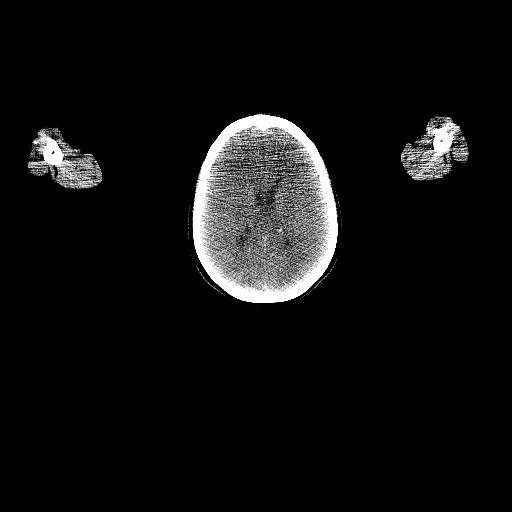

[Series 6: ct sk_thigh 5.0 b70f lung_bone · axial · 5.0mm · 0.64mm/px · 1 of 54 slices shown]
[im 1/54  bone]
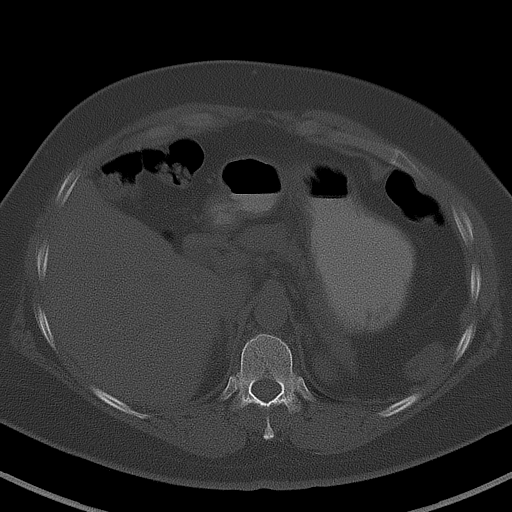

[Series 8: pet sk_thigh nac · axial · 5.0mm · 4.07mm/px · z∈[-946,-62]mm · 5 of 222 slices shown]
[im 1/222]
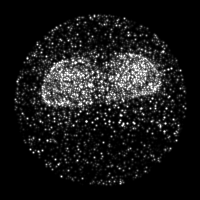
[im 56/222]
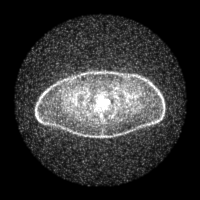
[im 111/222]
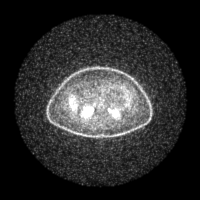
[im 166/222]
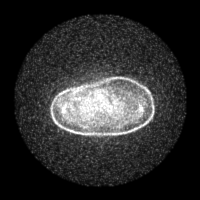
[im 222/222]
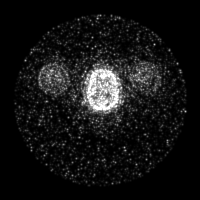

[Series 604: mip collection<mip range> · coronal · 1.83mm/px · 1 of 32 slices shown]
[im 1/32]
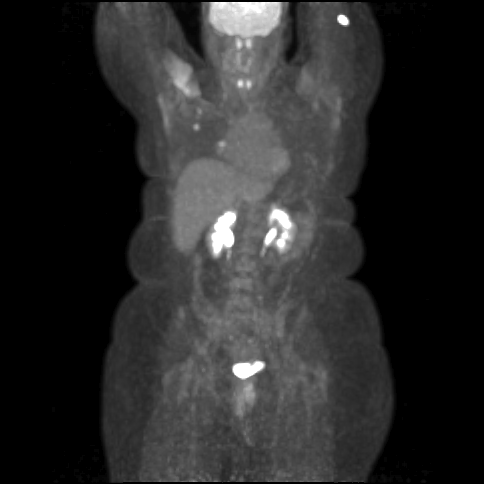

[Series 605: range-ct sk_thigh 5.0 (id)<alpha range> · 2 of 80 slices shown (1 of 2)]
[im 1/80]
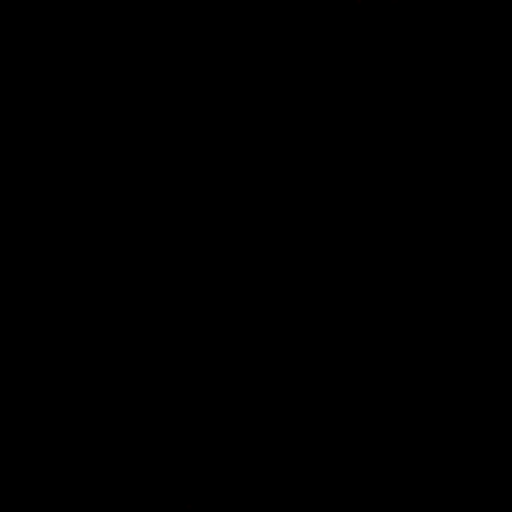
[im 80/80]
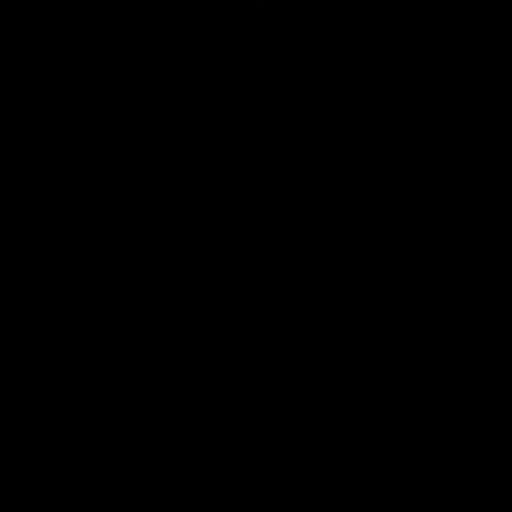

[Series 606: range-ct sk_thigh 5.0 (id)<alpha range> · 5 of 211 slices shown (2 of 2)]
[im 1/211]
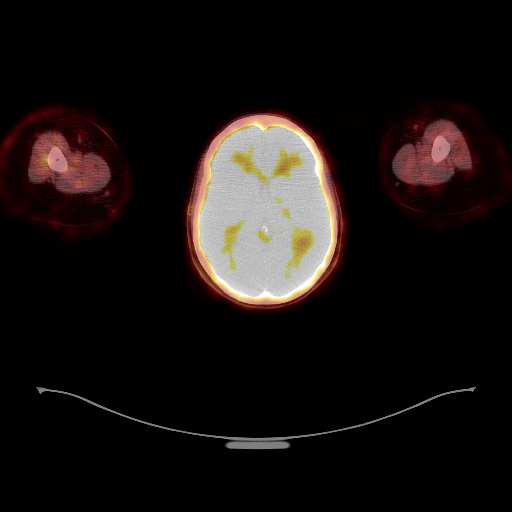
[im 53/211]
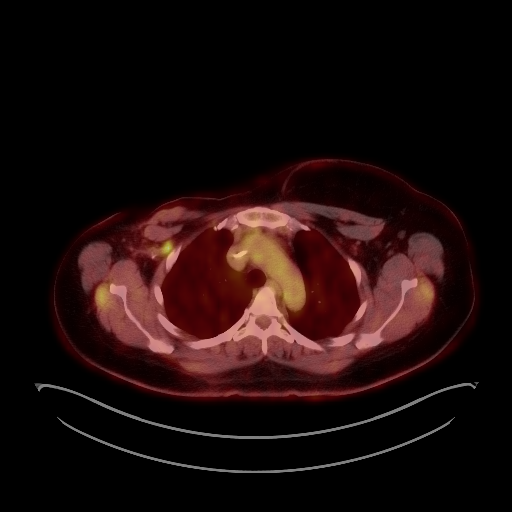
[im 106/211]
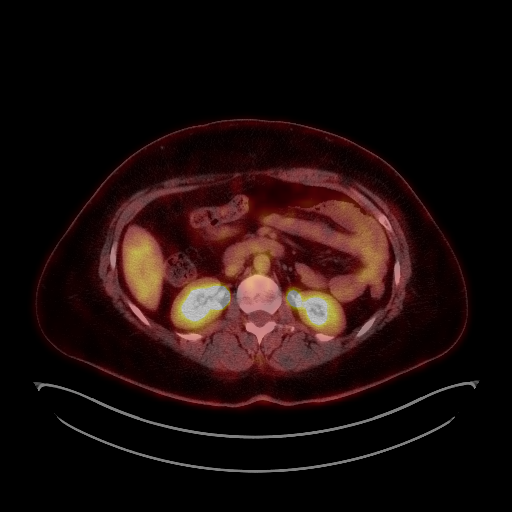
[im 158/211]
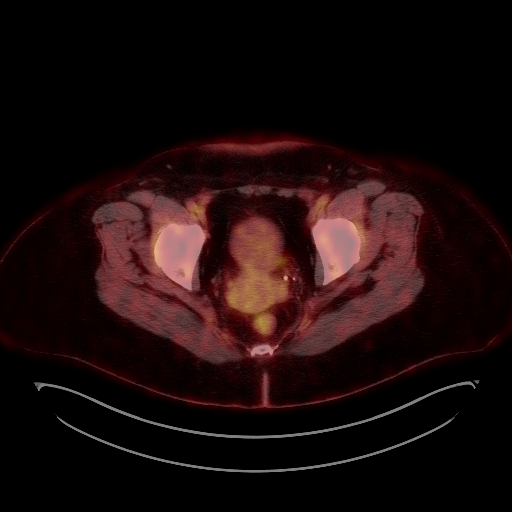
[im 211/211]
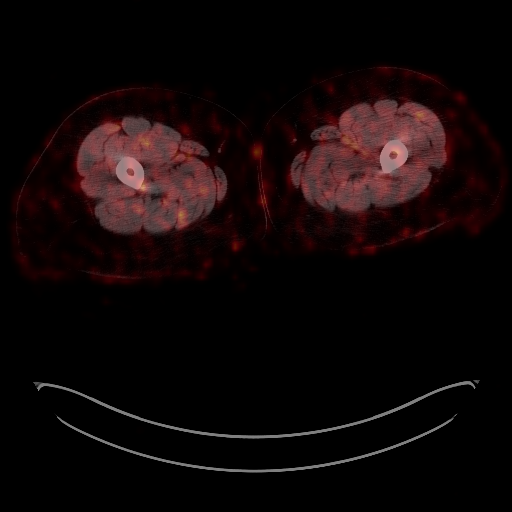

[Series 1087: results mm oncology reading · 1.09mm/px · 1 of 2 slices shown]
[im 1/2]
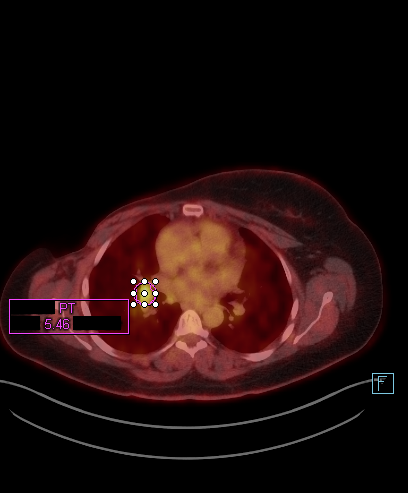

[25 of 25 positions shown; findings below may reference images not displayed]

FINDINGS: NECK

No hypermetabolic cervical lymph nodes are identified.Symmetric
activity within Waldeyer's ring is likely physiologic. There is
prominent activity associated with the muscles of phonation.

CHEST

Patient is status post right mastectomy and axillary node
dissection. There are 2 small foci of hypermetabolic activity within
the right subpectoral space, corresponding with small lymph nodes on
images 51 and 59. These have an SUV max of 3.8 and 5.9,
respectively. This axillary activity is significantly improved
compared with the prior PET-CT. There is new right hilar
hypermetabolic activity demonstrating an SUV max of 5.5. No definite
abnormal activity is seen within the multiple small pulmonary
nodules which are not optimally evaluated based on their small size.
Largest nodules include a 6 mm anterior right upper lobe nodule on
image number 21 and a right perihilar nodule on images 27 and 28.

ABDOMEN/PELVIS

There is no hypermetabolic activity within the liver, adrenal
glands, spleen or pancreas. There is no hypermetabolic nodal
activity. Previous splenectomy noted with a small amount of
regenerated splenic tissue.

SKELETON

There is no hypermetabolic activity to suggest osseous metastatic
disease. Muscular activity around the right shoulder appears
physiologic. Activity in the left upper arm is attributed to the
injection.
IMPRESSION: 1. No abnormal activity is seen within the multiple new small
pulmonary nodules on recent CT. Most of these nodules are too small
to optimally evaluate with PET-CT. These are indeterminate and may
reflect metastatic disease or sequela of infection.
2. Hypermetabolic nodal activity noted within the right subpectoral
space and right hilum. The subpectoral activity is somewhat
suspicious for local recurrence. The hilar activity is less
specific.
3. No other evidence of metastatic disease.
4. Chest CT follow-up in 3-6 months recommended.

## 2016-07-28 ENCOUNTER — Other Ambulatory Visit: Payer: Self-pay | Admitting: Nurse Practitioner

## 2017-12-26 DEATH — deceased
